# Patient Record
Sex: Male | Born: 1935 | Race: White | Hispanic: No | Marital: Married | Smoking: Former smoker
Health system: Southern US, Community
[De-identification: ages and names within clinical notes are randomized; demographics above are authoritative.]

## PROBLEM LIST (undated history)

## (undated) DIAGNOSIS — G459 Transient cerebral ischemic attack, unspecified: Secondary | ICD-10-CM

## (undated) DIAGNOSIS — N183 Chronic kidney disease, stage 3 unspecified: Secondary | ICD-10-CM

## (undated) DIAGNOSIS — R42 Dizziness and giddiness: Secondary | ICD-10-CM

## (undated) DIAGNOSIS — I10 Essential (primary) hypertension: Secondary | ICD-10-CM

## (undated) DIAGNOSIS — D649 Anemia, unspecified: Secondary | ICD-10-CM

## (undated) DIAGNOSIS — K766 Portal hypertension: Secondary | ICD-10-CM

## (undated) DIAGNOSIS — R161 Splenomegaly, not elsewhere classified: Secondary | ICD-10-CM

## (undated) DIAGNOSIS — K5520 Angiodysplasia of colon without hemorrhage: Secondary | ICD-10-CM

## (undated) DIAGNOSIS — G629 Polyneuropathy, unspecified: Secondary | ICD-10-CM

## (undated) DIAGNOSIS — D509 Iron deficiency anemia, unspecified: Secondary | ICD-10-CM

## (undated) DIAGNOSIS — E669 Obesity, unspecified: Secondary | ICD-10-CM

## (undated) DIAGNOSIS — I8500 Esophageal varices without bleeding: Secondary | ICD-10-CM

## (undated) DIAGNOSIS — B9681 Helicobacter pylori [H. pylori] as the cause of diseases classified elsewhere: Secondary | ICD-10-CM

## (undated) DIAGNOSIS — D696 Thrombocytopenia, unspecified: Secondary | ICD-10-CM

## (undated) DIAGNOSIS — D126 Benign neoplasm of colon, unspecified: Secondary | ICD-10-CM

## (undated) DIAGNOSIS — K2970 Gastritis, unspecified, without bleeding: Secondary | ICD-10-CM

## (undated) DIAGNOSIS — E119 Type 2 diabetes mellitus without complications: Secondary | ICD-10-CM

## (undated) DIAGNOSIS — K922 Gastrointestinal hemorrhage, unspecified: Secondary | ICD-10-CM

## (undated) DIAGNOSIS — E785 Hyperlipidemia, unspecified: Secondary | ICD-10-CM

## (undated) HISTORY — DX: Angiodysplasia of colon without hemorrhage: K55.20

## (undated) HISTORY — DX: Gastrointestinal hemorrhage, unspecified: K92.2

## (undated) HISTORY — DX: Obesity, unspecified: E66.9

## (undated) HISTORY — DX: Splenomegaly, not elsewhere classified: R16.1

## (undated) HISTORY — DX: Chronic kidney disease, stage 3 (moderate): N18.3

## (undated) HISTORY — DX: Iron deficiency anemia, unspecified: D50.9

## (undated) HISTORY — DX: Helicobacter pylori (H. pylori) as the cause of diseases classified elsewhere: B96.81

## (undated) HISTORY — DX: Dizziness and giddiness: R42

## (undated) HISTORY — DX: Esophageal varices without bleeding: I85.00

## (undated) HISTORY — DX: Transient cerebral ischemic attack, unspecified: G45.9

## (undated) HISTORY — DX: Type 2 diabetes mellitus without complications: E11.9

## (undated) HISTORY — DX: Gastritis, unspecified, without bleeding: K29.70

## (undated) HISTORY — DX: Anemia, unspecified: D64.9

## (undated) HISTORY — DX: Thrombocytopenia, unspecified: D69.6

## (undated) HISTORY — DX: Portal hypertension: K76.6

## (undated) HISTORY — DX: Benign neoplasm of colon, unspecified: D12.6

## (undated) HISTORY — DX: Essential (primary) hypertension: I10

## (undated) HISTORY — DX: Chronic kidney disease, stage 3 unspecified: N18.30

## (undated) HISTORY — PX: TONSILLECTOMY AND ADENOIDECTOMY: SUR1326

## (undated) HISTORY — DX: Polyneuropathy, unspecified: G62.9

## (undated) HISTORY — DX: Hyperlipidemia, unspecified: E78.5

---

## 2006-08-27 ENCOUNTER — Ambulatory Visit (HOSPITAL_COMMUNITY): Admission: RE | Admit: 2006-08-27 | Discharge: 2006-08-27 | Payer: Self-pay | Admitting: Family Medicine

## 2008-09-19 ENCOUNTER — Ambulatory Visit: Payer: Self-pay | Admitting: Hematology and Oncology

## 2008-09-21 LAB — CBC & DIFF AND RETIC
BASO%: 0.5 % (ref 0.0–2.0)
HCT: 33.1 % — ABNORMAL LOW (ref 38.4–49.9)
LYMPH%: 15.7 % (ref 14.0–49.0)
MCHC: 32.4 g/dL (ref 32.0–36.0)
MCV: 76.8 fL — ABNORMAL LOW (ref 79.3–98.0)
MONO#: 0.5 10*3/uL (ref 0.1–0.9)
MONO%: 7.3 % (ref 0.0–14.0)
NEUT%: 74.7 % (ref 39.0–75.0)
Platelets: 118 10*3/uL — ABNORMAL LOW (ref 140–400)
RBC: 4.31 10*6/uL (ref 4.20–5.82)
Retic %: 1.9 % (ref 0.7–2.3)
WBC: 6.6 10*3/uL (ref 4.0–10.3)

## 2008-09-21 LAB — MORPHOLOGY

## 2008-09-23 LAB — PROTEIN ELECTROPHORESIS, SERUM, WITH REFLEX
Albumin ELP: 56.2 % (ref 55.8–66.1)
Alpha-1-Globulin: 4.1 % (ref 2.9–4.9)
Beta Globulin: 6.5 % (ref 4.7–7.2)
Total Protein, Serum Electrophoresis: 7.3 g/dL (ref 6.0–8.3)

## 2008-09-23 LAB — DIRECT ANTIGLOBULIN TEST (NOT AT ARMC)
DAT (Complement): NEGATIVE
DAT IgG: NEGATIVE

## 2008-09-30 ENCOUNTER — Ambulatory Visit (HOSPITAL_COMMUNITY): Admission: RE | Admit: 2008-09-30 | Discharge: 2008-09-30 | Payer: Self-pay | Admitting: Hematology and Oncology

## 2008-10-07 ENCOUNTER — Ambulatory Visit (HOSPITAL_COMMUNITY): Admission: RE | Admit: 2008-10-07 | Discharge: 2008-10-07 | Payer: Self-pay | Admitting: General Surgery

## 2008-10-07 ENCOUNTER — Encounter (INDEPENDENT_AMBULATORY_CARE_PROVIDER_SITE_OTHER): Payer: Self-pay | Admitting: General Surgery

## 2008-10-26 ENCOUNTER — Ambulatory Visit: Payer: Self-pay | Admitting: Hematology and Oncology

## 2008-11-18 ENCOUNTER — Ambulatory Visit (HOSPITAL_COMMUNITY): Admission: RE | Admit: 2008-11-18 | Discharge: 2008-11-18 | Payer: Self-pay | Admitting: Hematology and Oncology

## 2008-11-18 LAB — CBC WITH DIFFERENTIAL/PLATELET
BASO%: 0.3 % (ref 0.0–2.0)
Basophils Absolute: 0 10*3/uL (ref 0.0–0.1)
EOS%: 1.8 % (ref 0.0–7.0)
HCT: 37.7 % — ABNORMAL LOW (ref 38.4–49.9)
HGB: 12.3 g/dL — ABNORMAL LOW (ref 13.0–17.1)
LYMPH%: 17 % (ref 14.0–49.0)
MCH: 26.9 pg — ABNORMAL LOW (ref 27.2–33.4)
MCHC: 32.7 g/dL (ref 32.0–36.0)
MCV: 82.5 fL (ref 79.3–98.0)
MONO%: 6.6 % (ref 0.0–14.0)
NEUT%: 74.3 % (ref 39.0–75.0)
lymph#: 1.1 10*3/uL (ref 0.9–3.3)

## 2008-11-18 LAB — BASIC METABOLIC PANEL
BUN: 13 mg/dL (ref 6–23)
Calcium: 9.1 mg/dL (ref 8.4–10.5)
Chloride: 105 mEq/L (ref 96–112)
Creatinine, Ser: 0.74 mg/dL (ref 0.40–1.50)

## 2008-11-25 ENCOUNTER — Ambulatory Visit: Payer: Self-pay | Admitting: Hematology and Oncology

## 2009-12-18 ENCOUNTER — Ambulatory Visit: Payer: Self-pay | Admitting: Hematology & Oncology

## 2009-12-29 LAB — CBC WITH DIFFERENTIAL (CANCER CENTER ONLY)
BASO#: 0 10*3/uL (ref 0.0–0.2)
Eosinophils Absolute: 0.2 10*3/uL (ref 0.0–0.5)
HGB: 14.5 g/dL (ref 13.0–17.1)
LYMPH#: 0.9 10*3/uL (ref 0.9–3.3)
MCH: 32.8 pg (ref 28.0–33.4)
MONO#: 0.3 10*3/uL (ref 0.1–0.9)
MONO%: 6.1 % (ref 0.0–13.0)
NEUT#: 2.9 10*3/uL (ref 1.5–6.5)
RBC: 4.42 10*6/uL (ref 4.20–5.70)
WBC: 4.2 10*3/uL (ref 4.0–10.0)

## 2009-12-29 LAB — RETICULOCYTES (CHCC)
ABS Retic: 57.7 10*3/uL (ref 19.0–186.0)
RBC.: 4.44 MIL/uL (ref 4.22–5.81)
Retic Ct Pct: 1.3 % (ref 0.4–3.1)

## 2009-12-29 LAB — FERRITIN: Ferritin: 96 ng/mL (ref 22–322)

## 2009-12-29 LAB — IRON AND TIBC: Iron: 129 ug/dL (ref 42–165)

## 2010-02-15 ENCOUNTER — Ambulatory Visit: Payer: Self-pay | Admitting: Hematology & Oncology

## 2010-02-16 LAB — CBC WITH DIFFERENTIAL (CANCER CENTER ONLY)
BASO%: 0.5 % (ref 0.0–2.0)
HCT: 36.1 % — ABNORMAL LOW (ref 38.7–49.9)
HGB: 12.3 g/dL — ABNORMAL LOW (ref 13.0–17.1)
LYMPH#: 0.9 10*3/uL (ref 0.9–3.3)
MONO#: 0.4 10*3/uL (ref 0.1–0.9)
NEUT%: 69 % (ref 40.0–80.0)
RDW: 11.9 % (ref 10.5–14.6)
WBC: 4.9 10*3/uL (ref 4.0–10.0)

## 2010-04-05 ENCOUNTER — Ambulatory Visit: Payer: Self-pay | Admitting: Hematology & Oncology

## 2010-04-06 LAB — CBC WITH DIFFERENTIAL (CANCER CENTER ONLY)
BASO#: 0 10*3/uL (ref 0.0–0.2)
BASO%: 0.3 % (ref 0.0–2.0)
EOS%: 4.2 % (ref 0.0–7.0)
HCT: 38.8 % (ref 38.7–49.9)
HGB: 13.1 g/dL (ref 13.0–17.1)
LYMPH%: 14.9 % (ref 14.0–48.0)
MCHC: 33.7 g/dL (ref 32.0–35.9)
MCV: 100 fL — ABNORMAL HIGH (ref 82–98)
NEUT%: 72.8 % (ref 40.0–80.0)
RDW: 11.5 % (ref 10.5–14.6)

## 2010-08-31 ENCOUNTER — Other Ambulatory Visit: Payer: Self-pay | Admitting: Hematology & Oncology

## 2010-08-31 ENCOUNTER — Encounter (HOSPITAL_BASED_OUTPATIENT_CLINIC_OR_DEPARTMENT_OTHER): Payer: Medicare Other | Admitting: Hematology & Oncology

## 2010-08-31 DIAGNOSIS — D696 Thrombocytopenia, unspecified: Secondary | ICD-10-CM

## 2010-08-31 LAB — CBC WITH DIFFERENTIAL (CANCER CENTER ONLY)
BASO#: 0 10*3/uL (ref 0.0–0.2)
BASO%: 0.3 % (ref 0.0–2.0)
HCT: 35.4 % — ABNORMAL LOW (ref 38.7–49.9)
HGB: 12.5 g/dL — ABNORMAL LOW (ref 13.0–17.1)
LYMPH%: 20.5 % (ref 14.0–48.0)
MCHC: 35.3 g/dL (ref 32.0–35.9)
MCV: 96 fL (ref 82–98)
MONO#: 0.6 10*3/uL (ref 0.1–0.9)
NEUT%: 69 % (ref 40.0–80.0)
RDW: 12.7 % (ref 11.1–15.7)
WBC: 6.9 10*3/uL (ref 4.0–10.0)

## 2010-09-11 NOTE — H&P (Signed)
NAME:  Gary Ingram, Gary Ingram NO.:  [id number]   MEDICAL RECORD NO.:  [id number]          PATIENT TYPE:  AMB   LOCATION:  DAY                           FACILITY:  [facility]   PHYSICIAN:  Jamesetta So, M.D.  DATE OF BIRTH:  [birth year]   DATE OF ADMISSION:  DATE OF DISCHARGE:  LH                              HISTORY & PHYSICAL   CHIEF COMPLAINT:  Anemia, iron deficiency.   HISTORY OF PRESENT ILLNESS:  The patient is a 77 year old white male,  who is referred for endoscopic evaluation.  Needs colonoscopy due to  iron-deficiency anemia.  No abdominal pain, weight loss, nausea,  vomiting, diarrhea, constipation, melena, or hematochezia have been  noted.  He has never had a colonoscopy.  There is no family history of  colon carcinoma.   PAST MEDICAL HISTORY:  1. Type 2 diabetes.  2. Hypertension.  3. Vertigo.  4. Questionable history of TIAs.   CURRENT MEDICATIONS:  Ecotrin, meclizine, and metoprolol.   ALLERGIES:  No known drug allergies.   REVIEW OF SYSTEMS:  Noncontributory.   PHYSICAL EXAMINATION:  GENERAL:  The patient is a well-developed, well-  nourished white male in no acute distress.  LUNGS:  Clear to auscultation with equal breath sounds bilaterally.  HEART:  Regular rate and rhythm without S3, S4, or murmurs.  ABDOMEN:  Soft, nontender, and nondistended.  No hepatosplenomegaly or  masses are noted.  RECTAL:  Deferred due to the procedure.   IMPRESSION:  Anemia, iron deficiency.   PLAN:  The patient is scheduled for colonoscopy on October 07, 2008.  Risks  and benefits of the procedure including bleeding and perforation were  fully explained to the patient, gave informed consent.      Jamesetta So, M.D.  Electronically Signed     MAJ/MEDQ  D:  09/27/2008  T:  09/28/2008  Job:  [id number]   cc:   Leonides Grills, M.D.  Fax: [phone]

## 2011-02-01 ENCOUNTER — Encounter (HOSPITAL_BASED_OUTPATIENT_CLINIC_OR_DEPARTMENT_OTHER): Payer: Medicare Other | Admitting: Hematology & Oncology

## 2011-02-01 ENCOUNTER — Other Ambulatory Visit: Payer: Self-pay | Admitting: Hematology & Oncology

## 2011-02-01 DIAGNOSIS — D649 Anemia, unspecified: Secondary | ICD-10-CM

## 2011-02-01 DIAGNOSIS — D696 Thrombocytopenia, unspecified: Secondary | ICD-10-CM

## 2011-02-01 LAB — CBC WITH DIFFERENTIAL (CANCER CENTER ONLY)
BASO#: 0 10*3/uL (ref 0.0–0.2)
Eosinophils Absolute: 0.1 10*3/uL (ref 0.0–0.5)
HGB: 11.8 g/dL — ABNORMAL LOW (ref 13.0–17.1)
MONO#: 0.4 10*3/uL (ref 0.1–0.9)
NEUT#: 3.1 10*3/uL (ref 1.5–6.5)
Platelets: 60 10*3/uL — ABNORMAL LOW (ref 145–400)
RBC: 3.41 10*6/uL — ABNORMAL LOW (ref 4.20–5.70)
WBC: 4.6 10*3/uL (ref 4.0–10.0)

## 2011-02-01 LAB — CHCC SATELLITE - SMEAR

## 2011-05-16 ENCOUNTER — Telehealth: Payer: Self-pay | Admitting: Hematology & Oncology

## 2011-05-16 NOTE — Telephone Encounter (Signed)
Pt cx 05/17/11 apt due to weather.  He resch for 05/31/11

## 2011-05-17 ENCOUNTER — Other Ambulatory Visit: Payer: Medicare Other | Admitting: Lab

## 2011-05-17 ENCOUNTER — Ambulatory Visit: Payer: Medicare Other | Admitting: Hematology & Oncology

## 2011-05-31 ENCOUNTER — Ambulatory Visit (HOSPITAL_BASED_OUTPATIENT_CLINIC_OR_DEPARTMENT_OTHER): Payer: Medicare Other | Admitting: Hematology & Oncology

## 2011-05-31 ENCOUNTER — Other Ambulatory Visit (HOSPITAL_BASED_OUTPATIENT_CLINIC_OR_DEPARTMENT_OTHER): Payer: Medicare Other | Admitting: Lab

## 2011-05-31 VITALS — BP 129/59 | HR 54 | Temp 96.9°F | Ht 68.0 in | Wt 240.0 lb

## 2011-05-31 DIAGNOSIS — D696 Thrombocytopenia, unspecified: Secondary | ICD-10-CM

## 2011-05-31 LAB — CBC WITH DIFFERENTIAL (CANCER CENTER ONLY)
BASO#: 0 10*3/uL (ref 0.0–0.2)
BASO%: 0.4 % (ref 0.0–2.0)
Eosinophils Absolute: 0.2 10*3/uL (ref 0.0–0.5)
HCT: 34.3 % — ABNORMAL LOW (ref 38.7–49.9)
HGB: 11.7 g/dL — ABNORMAL LOW (ref 13.0–17.1)
LYMPH#: 0.8 10*3/uL — ABNORMAL LOW (ref 0.9–3.3)
MONO#: 0.4 10*3/uL (ref 0.1–0.9)
NEUT%: 68.8 % (ref 40.0–80.0)
RBC: 3.46 10*6/uL — ABNORMAL LOW (ref 4.20–5.70)
WBC: 4.6 10*3/uL (ref 4.0–10.0)

## 2011-05-31 NOTE — Progress Notes (Signed)
This office note has been dictated.

## 2011-05-31 NOTE — Progress Notes (Signed)
CC:   Gary Ingram, M.D.  DIAGNOSIS:  Thrombocytopenia.  CURRENT THERAPY:  Observation.  INTERIM HISTORY:  Mr. Diagne comes in for his followup.  I saw him back in October.  He did well over the holidays.  He does not do all that much.  He is eating okay.  He has had no problem with bleeding or bruising.  He has had no problems with the flu or other infections.  PHYSICAL EXAMINATION:  General:  This is an elderly white gentleman in no obvious distress.  Vital signs:  Show a temperature of 96.9, pulse 54, respiratory rate 18, blood pressure 129/59.  Weight is 240.  Head and neck:  Exam shows a normocephalic, atraumatic skull.  There are no ocular or oral lesions.  There are no palpable cervical or supraclavicular lymph nodes.  Lungs:  Are clear bilaterally.  Cardiac: Regular rate and rhythm with normal S1 and S2.  He has a 1/6 systolic ejection murmur.  Abdomen:  Soft with good bowel sounds.  There is no palpable abdominal mass.  There is no palpable hepatosplenomegaly. Back:  No tenderness over the spine, ribs or hips.  Extremities:  Shows no clubbing, cyanosis or edema.  Neurological:  Exam shows no focal neurological deficits.  Skin:  Exam shows no rashes, ecchymoses or petechiae.  LABORATORY STUDIES:  White cell count is 4.6, hemoglobin 11.7, hematocrit 34.3, platelet count is 51.  MCV is 99.  IMPRESSION:  Mr. Wee is a 78 year old white gentleman with progressive thrombocytopenia.  We have been following him now for a year or so.  His platelet count has been trending down a little bit. For now, I really think that we can just be conservative.  He is still asymptomatic.  Will plan to get him back to see [place] in another 2 months.  If his blood count continues to fall then we are going to be forced to do a bone marrow biopsy on him.  I will see Mr. Riche back in 2 months.    ______________________________ Volanda Napoleon, M.D. PRE/MEDQ  D:  05/31/2011  T:  05/31/2011   Job:  [id number]

## 2011-06-25 IMAGING — CT CT ABDOMEN W/ CM
2 of 5 series · 16 of 46 positions shown, 18 images · IV contrast (omniscan)
Comparison: Abdominal ultrasound 09/30/2008. No similar prior study
is available for comparison.

CLINICAL DATA: Enlarged spleen, thrombocytopenia

CT ABDOMEN WITH CONTRAST
TECHNIQUE: Multidetector CT imaging of the abdomen was performed
using the standard protocol following bolus administration of
intravenous contrast.
Contrast: 100 ml Omniscan 300 IV contrast

[Series 2: rtn a/p with · axial · 0.89mm/px · z∈[-243,-43]mm · 13 of 48 slices shown, 15 images]
[im 4/48  soft-tissue]
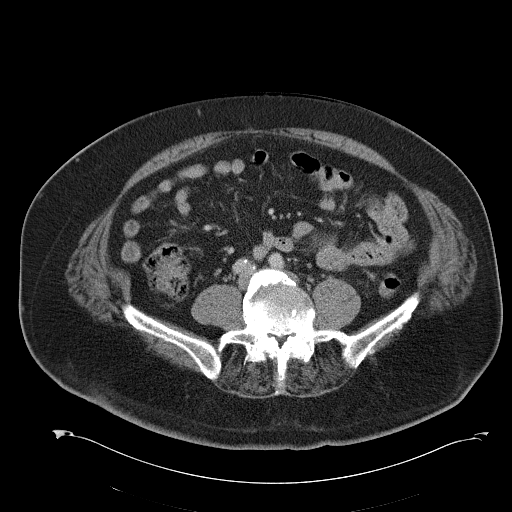
[im 4/48  bone]
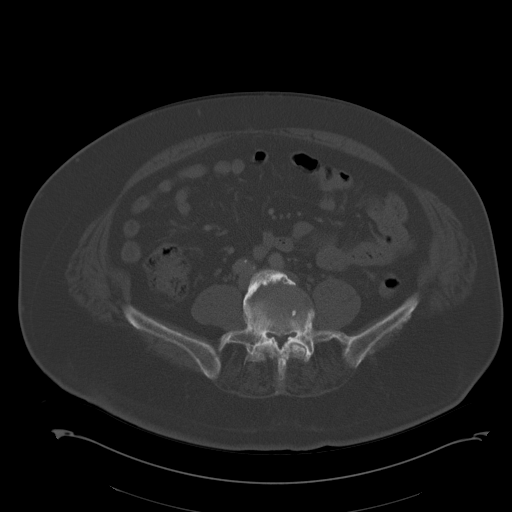
[im 7/48  soft-tissue]
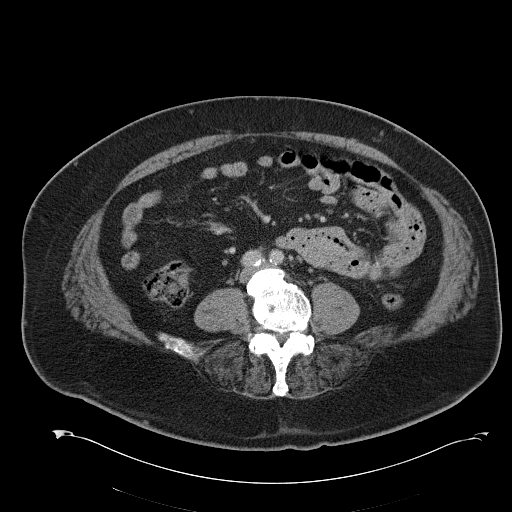
[im 11/48  soft-tissue]
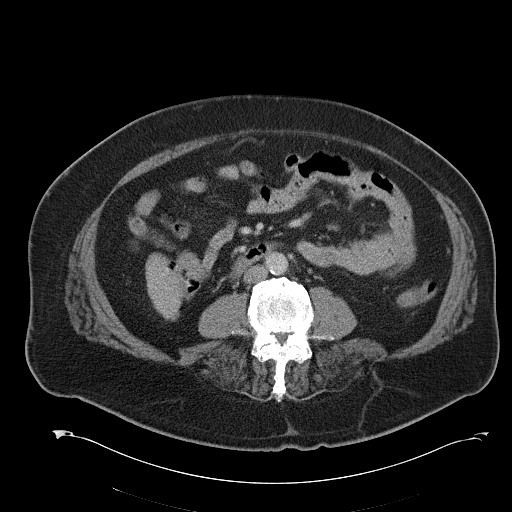
[im 14/48  soft-tissue]
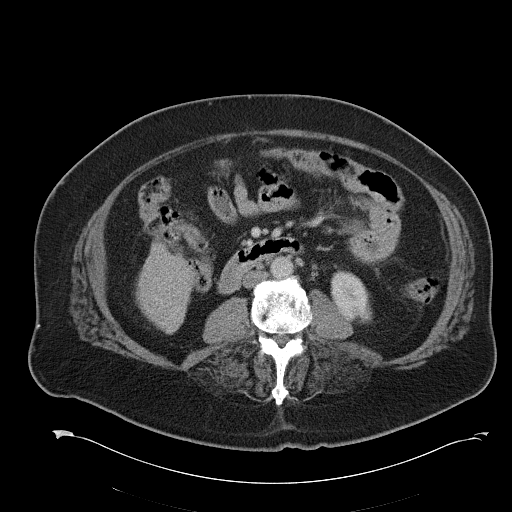
[im 17/48  soft-tissue]
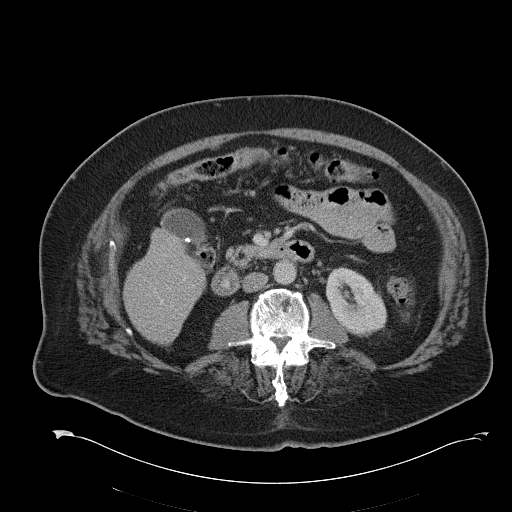
[im 21/48  soft-tissue]
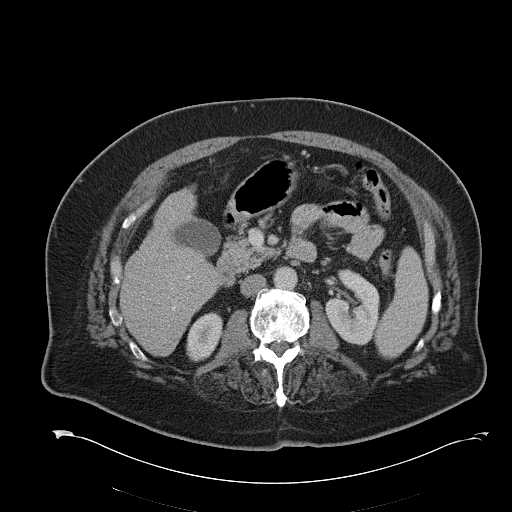
[im 24/48  soft-tissue]
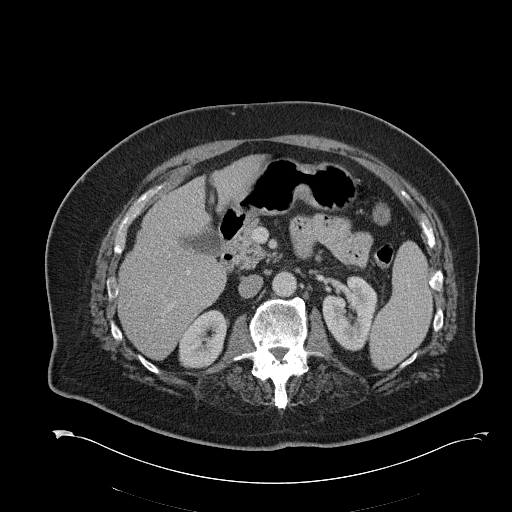
[im 27/48  soft-tissue]
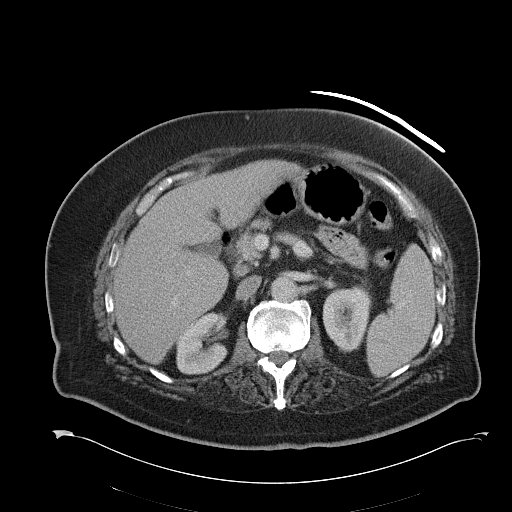
[im 31/48  soft-tissue]
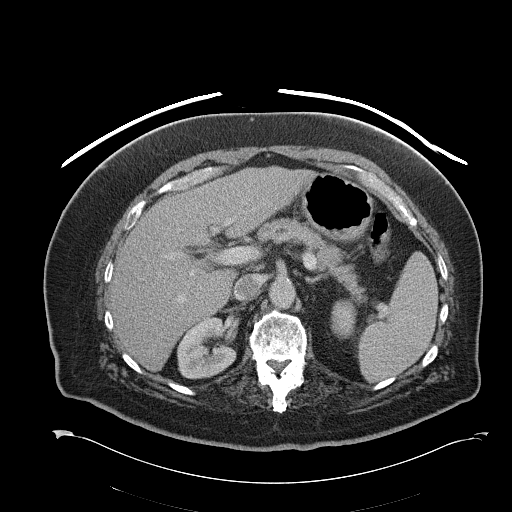
[im 31/48  bone]
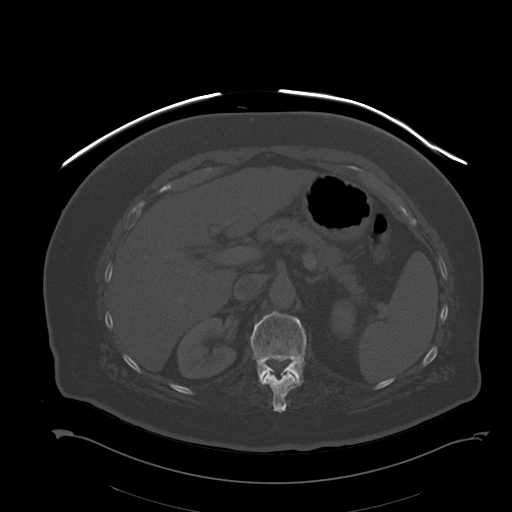
[im 34/48  soft-tissue]
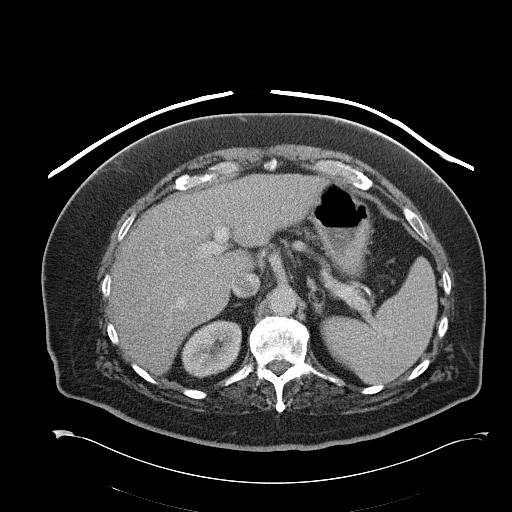
[im 37/48  soft-tissue]
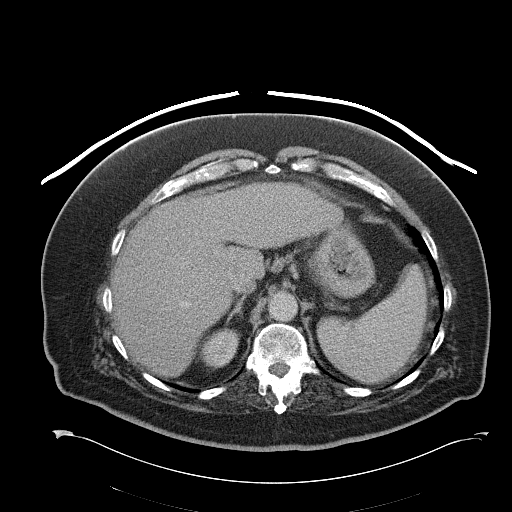
[im 41/48  soft-tissue]
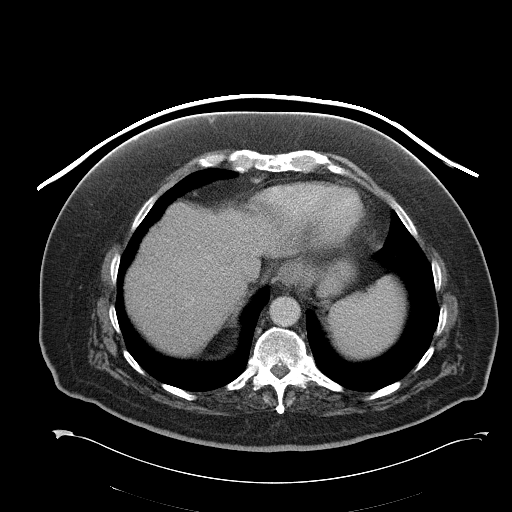
[im 44/48  soft-tissue]
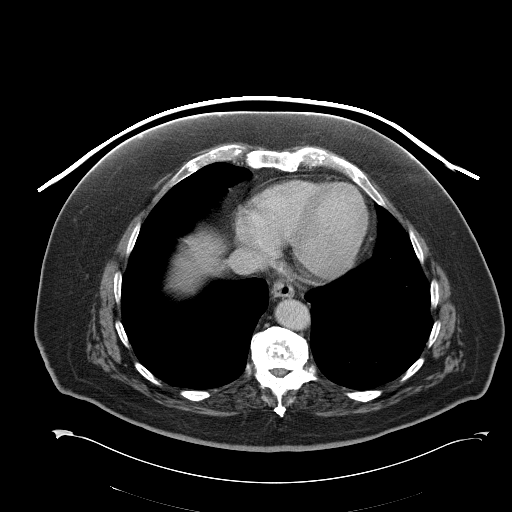

[Series 602: <mpr thick range> · coronal · 0.89mm/px · 3 of 84 slices shown]
[im 28/84  soft-tissue]
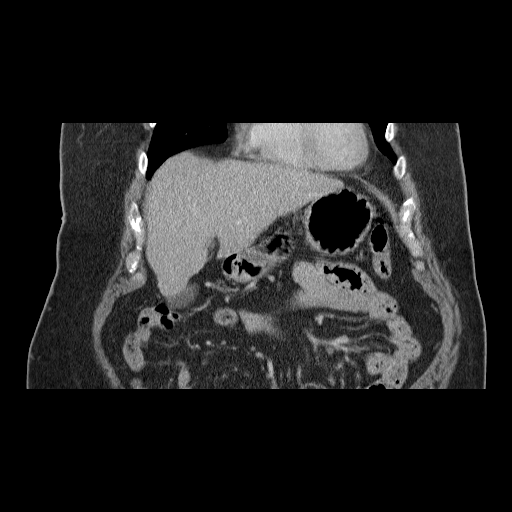
[im 37/84  soft-tissue]
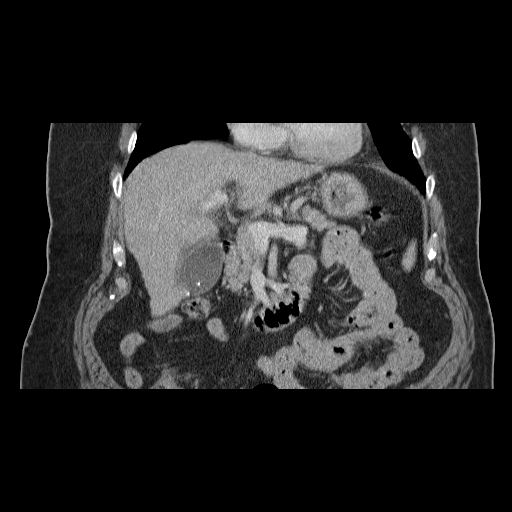
[im 47/84  soft-tissue]
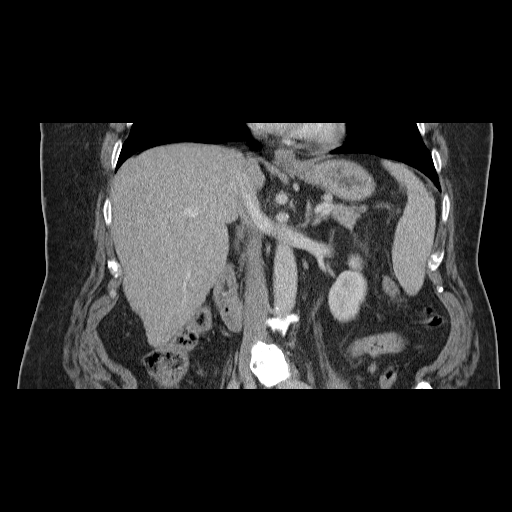

[16 of 46 positions shown; findings below may reference images not displayed]

FINDINGS: The spleen measures 11.6 x 5.8 by 12.6 cm (volume 445
cc). A gallstone is incidentally noted without other CT evidence
for cholecystitis.  Liver, kidneys, the right adrenal gland, and
pancreas are unremarkable.  A 1.4 cm left adrenal mass containing
gross fat is compatible with a myelolipoma.  Scattered
atherosclerotic aortic calcification noted. Disc degenerative
changes are noted.
IMPRESSION: Splenic volume is mildly enlarged.

Left adrenal myelolipoma incidentally noted.

No acute finding.

## 2011-08-02 ENCOUNTER — Ambulatory Visit (HOSPITAL_BASED_OUTPATIENT_CLINIC_OR_DEPARTMENT_OTHER): Payer: Medicare Other | Admitting: Hematology & Oncology

## 2011-08-02 ENCOUNTER — Other Ambulatory Visit (HOSPITAL_BASED_OUTPATIENT_CLINIC_OR_DEPARTMENT_OTHER): Payer: Medicare Other | Admitting: Lab

## 2011-08-02 VITALS — BP 132/71 | HR 62 | Temp 97.1°F | Ht 68.0 in | Wt 241.0 lb

## 2011-08-02 DIAGNOSIS — D696 Thrombocytopenia, unspecified: Secondary | ICD-10-CM

## 2011-08-02 DIAGNOSIS — D649 Anemia, unspecified: Secondary | ICD-10-CM

## 2011-08-02 LAB — CBC WITH DIFFERENTIAL (CANCER CENTER ONLY)
BASO%: 0.9 % (ref 0.0–2.0)
EOS%: 2.4 % (ref 0.0–7.0)
HCT: 33.6 % — ABNORMAL LOW (ref 38.7–49.9)
LYMPH#: 1 10*3/uL (ref 0.9–3.3)
MCHC: 34.5 g/dL (ref 32.0–35.9)
MONO#: 0.5 10*3/uL (ref 0.1–0.9)
NEUT%: 70.3 % (ref 40.0–80.0)
Platelets: 61 10*3/uL — ABNORMAL LOW (ref 145–400)
RDW: 12.8 % (ref 11.1–15.7)
WBC: 5.8 10*3/uL (ref 4.0–10.0)

## 2011-08-02 LAB — CHCC SATELLITE - SMEAR

## 2011-08-02 NOTE — Progress Notes (Signed)
This office note has been dictated.

## 2011-08-03 NOTE — Progress Notes (Signed)
CC:   Janalyn Rouse, M.D.  DIAGNOSIS:  Chronic thrombocytopenia, asymptomatic.  CURRENT THERAPY:  Observation.  INTERIM HISTORY:  Mr. Kustra comes in for followup.  We last saw him in February.  At that point in time, his platelet count had gone down to 50.  I just want make sure we follow him closely.  Since then, he has had no problems.  There has been no change in medications.  He is on quite a few medicines.  He has had no bleeding present.  He has had no bruising.  He has had no change in bowel or bladder habits.  There has been no cough.  There have been no rashes.  PHYSICAL EXAMINATION:  This is an elderly white gentleman in no obvious distress.  Vital signs: 97.1, pulse 62, respiratory rate 18, blood pressure 132/71.  Weight is 241 pounds.  Head and neck exam shows a normocephalic, atraumatic skull.  There are no ocular or oral lesions. There are no palpable cervical or supraclavicular lymph nodes.  Lungs: Clear to percussion and auscultation bilaterally.  Cardiac: Regular rhythm with a normal S1, S2.  There are no murmurs, rubs, or bruits. Abdomen:  Soft with good bowel sounds.  He is slightly obese.  He has had no fluid wave.  There is no abdominal mass.  There is no palpable hepatosplenomegaly.  Back: No tenderness over the spine, ribs, or hips. Extremities:  No  clubbing, cyanosis or edema.  Neurologic:  No focal neurological deficits.  Skin:  No rashes, ecchymoses or petechia.  He may have a couple squamous cell or basal cell carcinomas on his skin, but these are small.  LABORATORY STUDIES:  White cell count 5.8, hemoglobin 11.6, hematocrit 33.6 and platelet count 61,000.  MCV is 99.  IMPRESSION:  Mr. Feicht is a 78 year old gentleman.  Again, he and I have the same birthday.  His platelet count is relatively stable.  He has mild anemia.  He is asymptomatic.  I still feel that we can follow him supportively.  I do not see a need for a bone marrow test on  him.  We will plan to get him back now in I think 4 months.  I think we can get him through the holidays and through the summer time.   ______________________________ Volanda Napoleon, M.D. PRE/MEDQ  D:  08/02/2011  T:  08/03/2011  Job:  [id number]

## 2011-12-13 ENCOUNTER — Ambulatory Visit: Payer: Medicare Other | Admitting: Medical

## 2011-12-13 ENCOUNTER — Other Ambulatory Visit: Payer: Medicare Other | Admitting: Lab

## 2011-12-16 ENCOUNTER — Telehealth: Payer: Self-pay | Admitting: Hematology & Oncology

## 2011-12-16 NOTE — Telephone Encounter (Signed)
Pt rescheduled missed 8-16 to 9-13

## 2012-01-10 ENCOUNTER — Other Ambulatory Visit (HOSPITAL_BASED_OUTPATIENT_CLINIC_OR_DEPARTMENT_OTHER): Payer: Medicare Other | Admitting: Lab

## 2012-01-10 ENCOUNTER — Ambulatory Visit (HOSPITAL_BASED_OUTPATIENT_CLINIC_OR_DEPARTMENT_OTHER): Payer: Medicare Other | Admitting: Hematology & Oncology

## 2012-01-10 VITALS — BP 124/56 | HR 61 | Temp 97.4°F | Resp 22 | Ht 68.0 in | Wt 225.0 lb

## 2012-01-10 DIAGNOSIS — D649 Anemia, unspecified: Secondary | ICD-10-CM

## 2012-01-10 DIAGNOSIS — D696 Thrombocytopenia, unspecified: Secondary | ICD-10-CM

## 2012-01-10 LAB — CBC WITH DIFFERENTIAL (CANCER CENTER ONLY)
EOS%: 3.4 % (ref 0.0–7.0)
Eosinophils Absolute: 0.2 10*3/uL (ref 0.0–0.5)
LYMPH#: 1.1 10*3/uL (ref 0.9–3.3)
MCH: 33 pg (ref 28.0–33.4)
MCHC: 32.6 g/dL (ref 32.0–35.9)
MONO%: 8.6 % (ref 0.0–13.0)
NEUT#: 3.9 10*3/uL (ref 1.5–6.5)
Platelets: 70 10*3/uL — ABNORMAL LOW (ref 145–400)
RBC: 3.09 10*6/uL — ABNORMAL LOW (ref 4.20–5.70)

## 2012-01-10 LAB — CHCC SATELLITE - SMEAR

## 2012-01-10 NOTE — Progress Notes (Signed)
This office note has been dictated.

## 2012-01-10 NOTE — Progress Notes (Signed)
CC:   Janalyn Rouse, M.D.  DIAGNOSIS:  Chronic thrombocytopenia.  CURRENT THERAPY:  Observation.  INTERIM HISTORY:  Mr. Gary Ingram comes in for followup.  We see him every 6 months.  He has been doing well.  He had a good summer.  He had no problems over the summer.  He says there is no change in his medications.  He has had no bleeding or bruising.  There has been no change in bowel or bladder habits.  He has not noticed any leg swelling.  There have been no rashes.  PHYSICAL EXAMINATION:  General:  This is an elderly white gentleman in no obvious distress.  Vital signs:  Temperature of 97.4, pulse 61, respiratory rate 22, blood pressure 124/56.  Weight is 225.  Head and neck:  Normocephalic, atraumatic skull.  He has no ocular or oral lesions.  There are no palpable cervical or supraclavicular lymph nodes. Lungs:  Clear bilaterally.  Cardiac:  Regular rate and rhythm with a normal S1, S2.  He has an occasional extra beat.  He has no murmurs, rubs or bruits.  Abdomen:  Soft with good bowel sounds.  There is no palpable abdominal mass.  There is no fluid wave.  There is no palpable hepatosplenomegaly.  Extremities:  Shows no clubbing, cyanosis or edema. Skin:  No rashes, ecchymoses or petechiae.  Neurological:  Shows no focal neurological deficits.  LABORATORY STUDIES:  White cell count is 5.7, hemoglobin 10.2, hematocrit 31.3, platelet count 70.  MCV is 101.  IMPRESSION:  Mr. Gary Ingram is a 78 year old gentleman with thrombocytopenia.  This is stable to improving a little bit.  He is a little bit more anemic.  I am not sure as to what is going on with the anemia.  I looked at his blood under the microscope.  I did not see anything that was unusual with his blood count.  I did not see anything that showed nucleated red blood cells.  I saw no schistocytes.  There was no rouleaux formation.  I still think we can probably get him back in 6  months.    ______________________________ Volanda Napoleon, M.D. PRE/MEDQ  D:  01/10/2012  T:  01/10/2012  Job:  [id number]

## 2012-06-17 ENCOUNTER — Telehealth: Payer: Self-pay | Admitting: Hematology & Oncology

## 2012-06-17 NOTE — Telephone Encounter (Signed)
Pt aware of 4-11 appointment

## 2012-07-01 ENCOUNTER — Ambulatory Visit: Payer: Medicare Other | Admitting: Hematology & Oncology

## 2012-07-01 ENCOUNTER — Other Ambulatory Visit: Payer: Medicare Other | Admitting: Lab

## 2012-07-03 ENCOUNTER — Ambulatory Visit: Payer: Medicare Other | Admitting: Hematology & Oncology

## 2012-07-03 ENCOUNTER — Other Ambulatory Visit: Payer: Medicare Other | Admitting: Lab

## 2012-08-07 ENCOUNTER — Ambulatory Visit (HOSPITAL_BASED_OUTPATIENT_CLINIC_OR_DEPARTMENT_OTHER): Payer: Medicare Other | Admitting: Hematology & Oncology

## 2012-08-07 ENCOUNTER — Other Ambulatory Visit (HOSPITAL_BASED_OUTPATIENT_CLINIC_OR_DEPARTMENT_OTHER): Payer: Medicare Other | Admitting: Lab

## 2012-08-07 VITALS — BP 141/55 | HR 57 | Temp 97.3°F | Resp 16 | Ht 68.0 in | Wt 228.0 lb

## 2012-08-07 DIAGNOSIS — D696 Thrombocytopenia, unspecified: Secondary | ICD-10-CM

## 2012-08-07 DIAGNOSIS — D649 Anemia, unspecified: Secondary | ICD-10-CM

## 2012-08-07 LAB — CBC WITH DIFFERENTIAL (CANCER CENTER ONLY)
BASO#: 0 10*3/uL (ref 0.0–0.2)
Eosinophils Absolute: 0.1 10*3/uL (ref 0.0–0.5)
HCT: 32.8 % — ABNORMAL LOW (ref 38.7–49.9)
HGB: 10.8 g/dL — ABNORMAL LOW (ref 13.0–17.1)
LYMPH%: 20 % (ref 14.0–48.0)
MCV: 94 fL (ref 82–98)
MONO#: 0.5 10*3/uL (ref 0.1–0.9)
NEUT%: 67.6 % (ref 40.0–80.0)
RBC: 3.49 10*6/uL — ABNORMAL LOW (ref 4.20–5.70)
WBC: 5.1 10*3/uL (ref 4.0–10.0)

## 2012-08-07 LAB — CHCC SATELLITE - SMEAR

## 2012-08-07 LAB — RETICULOCYTES (CHCC)
RBC.: 3.55 MIL/uL — ABNORMAL LOW (ref 4.22–5.81)
Retic Ct Pct: 1.9 % (ref 0.4–2.3)

## 2012-08-07 LAB — IRON AND TIBC
%SAT: 40 % (ref 20–55)
TIBC: 414 ug/dL (ref 215–435)

## 2012-08-07 NOTE — Progress Notes (Signed)
This office note has been dictated.

## 2012-08-08 NOTE — Progress Notes (Signed)
CC:   Janalyn Rouse, M.D.  DIAGNOSIS:  Chronic thrombocytopenia.  CURRENT THERAPY:  Observation.  INTERIM HISTORY:  Gary Ingram comes in for followup.  We see him every 6 months.  He has had no problems since we last saw him.  He got through the wintertime without any issues.  There has been no bleeding, no bruising.  He has not noticed any kind of rashes.  He has had no change in medications.  He is diabetic and on metformin.  He has had no fevers, sweats, or chills.  He did not get the flu over the wintertime.  PHYSICAL EXAMINATION:  General:  This is an elderly white gentleman in no obvious distress.  Vital signs:  Temperature of 97.3, pulse 57, respiratory rate 16, blood pressure 141/55.  Weight is 228.  Head and neck:  Normocephalic, atraumatic skull.  There are no ocular or oral lesions.  There are no palpable cervical or supraclavicular lymph nodes. Lungs:  Clear bilaterally.  Cardiac:  Regular rate and rhythm, with a normal S1 and S2.  He may have an occasional extra beat.  Abdomen: Soft.  He has good bowel sounds.  There is no palpable abdominal mass. There is no fluid wave.  There is no palpable hepatosplenomegaly.  Back: Shows no tenderness over the spine, ribs, or hips.  Extremities:  Show no clubbing, cyanosis or edema.  Skin:  Shows no rashes, ecchymoses, or petechiae.  LABORATORY STUDIES:  White cell count of 5.1, hemoglobin 10.8, hematocrit 32.8, platelet count 66,000.  MCV is 94.  Peripheral smear shows good maturation of his white blood cells.  There are no hypersegmented polys.  There are no immature myeloid cells. There may be a couple of atypical lymphs.  There are no nucleated red blood cells.  I see no rouleaux formation.  There are no schistocytes. Platelets are decreased in number.  He has a few large platelets.  IMPRESSION:  Gary Ingram is a 79 year old gentleman with thrombocytopenia.  We have been following him now for a couple of years. His  platelet count really has not changed.  I still do not see a need for a bone marrow test.  His blood smear is reassuring.  We will go ahead and get him back in 6 more months.    ______________________________ Gary Ingram, M.D. PRE/MEDQ  D:  08/07/2012  T:  08/08/2012  Job:  [id number]

## 2012-08-12 ENCOUNTER — Other Ambulatory Visit: Payer: Medicare Other | Admitting: Lab

## 2012-08-12 ENCOUNTER — Ambulatory Visit: Payer: Medicare Other | Admitting: Hematology & Oncology

## 2012-09-17 ENCOUNTER — Ambulatory Visit: Payer: Medicare Other | Admitting: Internal Medicine

## 2013-02-05 ENCOUNTER — Other Ambulatory Visit (HOSPITAL_BASED_OUTPATIENT_CLINIC_OR_DEPARTMENT_OTHER): Payer: Medicare Other | Admitting: Lab

## 2013-02-05 ENCOUNTER — Ambulatory Visit (HOSPITAL_BASED_OUTPATIENT_CLINIC_OR_DEPARTMENT_OTHER): Payer: Medicare Other | Admitting: Hematology & Oncology

## 2013-02-05 VITALS — BP 151/51 | HR 64 | Temp 97.3°F | Resp 18 | Ht 68.0 in | Wt 232.0 lb

## 2013-02-05 DIAGNOSIS — D649 Anemia, unspecified: Secondary | ICD-10-CM

## 2013-02-05 DIAGNOSIS — D696 Thrombocytopenia, unspecified: Secondary | ICD-10-CM

## 2013-02-05 LAB — CBC WITH DIFFERENTIAL (CANCER CENTER ONLY)
BASO%: 0.5 % (ref 0.0–2.0)
LYMPH%: 16 % (ref 14.0–48.0)
MCH: 29.6 pg (ref 28.0–33.4)
MCHC: 31.4 g/dL — ABNORMAL LOW (ref 32.0–35.9)
MCV: 94 fL (ref 82–98)
MONO%: 10.3 % (ref 0.0–13.0)
NEUT%: 70 % (ref 40.0–80.0)
Platelets: 63 10*3/uL — ABNORMAL LOW (ref 145–400)
RDW: 13.6 % (ref 11.1–15.7)

## 2013-02-05 LAB — CHCC SATELLITE - SMEAR

## 2013-02-05 NOTE — Progress Notes (Signed)
This office note has been dictated.

## 2013-02-06 NOTE — Progress Notes (Signed)
CC:   Gary Ingram, M.D.  DIAGNOSES: 1. Chronic thrombocytopenia. 2. Progressive anemia.  CURRENT THERAPY:  Observation.  INTERIM HISTORY:  Gary Ingram comes in for followup.  We see him every 6 months.  Since we last saw him, he has had absolutely no problems.  He feels great.  He has had no problems with bleeding or bruising.  There is no fatigue.  He has had a good appetite.  He has not noted any problems with weight loss or weight gain.  He has had no abdominal pain. There has been no change in bowel or bladder habits.  He has had no rashes.  Overall, his performance status is ECOG 2.  PHYSICAL EXAMINATION:  General:  This is a fairly well-developed, well- nourished white gentleman in no obvious distress.  Vital signs: Temperature of 97.3, pulse 64, respiratory rate 18, blood pressure 151/51.  Weight is 232 pounds.  Head and neck:  Normocephalic, atraumatic skull.  There are no ocular or oral lesions.  There are no palpable cervical or supraclavicular lymph nodes.  Lungs:  Clear bilaterally.  Cardiac:  Regular rate and rhythm with a normal S1 and S2. There are no murmurs, rubs or bruits.  Abdomen:  Soft.  He has good bowel sounds.  There is no fluid wave.  There is no palpable abdominal mass.  There is no palpable hepatosplenomegaly.  Extremities:  No clubbing, cyanosis or edema.  Neurologic:  No focal neurological deficits.  Skin:  No rashes, ecchymosis, or petechia.  LABORATORY STUDIES:  White cell count 4.4, hemoglobin 9.3, hematocrit 29.6, platelet count 63,000.  MCV is 94.  I did look at his blood smear.  His blood smear looked relatively unremarkable.  He had normochromic, normocytic red cells.  I do not see any nucleated red blood cells.  There were no target cells or rouleaux formation.  White cells appeared normal in morphology and maturation. There were no hypersegmented polys.  I had no immature myeloid or lymphoid forms.  Platelets were decreased in number.   Platelets were relatively uniform in size.  Platelets were well granulated.  IMPRESSION:  Gary Ingram is a nice 79 year old gentleman.  He has chronic thrombocytopenia.  For now, I think we can follow Gary Ingram supportively.  Again, I have noted that his hemoglobin keeps dropping.  This may be something that we are going to have to look into when we see him back.  He is asymptomatic with this.  We will plan for followup in 6 months.  I think that his if hemoglobin continues to drop, then we are going to have to look into this.    ______________________________ Volanda Napoleon, M.D. PRE/MEDQ  D:  02/05/2013  T:  02/06/2013  Job:  [id number]

## 2013-05-05 ENCOUNTER — Other Ambulatory Visit: Payer: Self-pay | Admitting: *Deleted

## 2013-05-05 NOTE — Progress Notes (Signed)
Received labs from PCP from 04/07/13. To move lab & MD f/u appt up to Jan or Feb. He is becoming more anemic but platelets are improving. Request sent to scheduling.

## 2013-05-06 ENCOUNTER — Telehealth: Payer: Self-pay | Admitting: Hematology & Oncology

## 2013-05-06 NOTE — Telephone Encounter (Signed)
Pt aware appointment moved from April to 2-6

## 2013-06-03 ENCOUNTER — Ambulatory Visit: Payer: Medicare Other | Admitting: Hematology & Oncology

## 2013-06-03 ENCOUNTER — Other Ambulatory Visit: Payer: Medicare Other | Admitting: Lab

## 2013-06-04 ENCOUNTER — Encounter: Payer: Self-pay | Admitting: Hematology & Oncology

## 2013-06-04 ENCOUNTER — Ambulatory Visit (HOSPITAL_BASED_OUTPATIENT_CLINIC_OR_DEPARTMENT_OTHER): Payer: Medicare Other

## 2013-06-04 ENCOUNTER — Other Ambulatory Visit: Payer: Self-pay | Admitting: *Deleted

## 2013-06-04 ENCOUNTER — Ambulatory Visit (HOSPITAL_BASED_OUTPATIENT_CLINIC_OR_DEPARTMENT_OTHER): Payer: Medicare Other | Admitting: Hematology & Oncology

## 2013-06-04 ENCOUNTER — Telehealth: Payer: Self-pay | Admitting: *Deleted

## 2013-06-04 ENCOUNTER — Other Ambulatory Visit (HOSPITAL_BASED_OUTPATIENT_CLINIC_OR_DEPARTMENT_OTHER): Payer: Medicare Other | Admitting: Lab

## 2013-06-04 VITALS — BP 124/43 | HR 67 | Temp 97.7°F | Resp 18 | Ht 70.0 in | Wt 234.0 lb

## 2013-06-04 DIAGNOSIS — K922 Gastrointestinal hemorrhage, unspecified: Secondary | ICD-10-CM

## 2013-06-04 DIAGNOSIS — K921 Melena: Secondary | ICD-10-CM

## 2013-06-04 DIAGNOSIS — D500 Iron deficiency anemia secondary to blood loss (chronic): Secondary | ICD-10-CM

## 2013-06-04 DIAGNOSIS — D509 Iron deficiency anemia, unspecified: Secondary | ICD-10-CM

## 2013-06-04 DIAGNOSIS — D696 Thrombocytopenia, unspecified: Secondary | ICD-10-CM

## 2013-06-04 DIAGNOSIS — D649 Anemia, unspecified: Secondary | ICD-10-CM

## 2013-06-04 LAB — CBC WITH DIFFERENTIAL (CANCER CENTER ONLY)
BASO#: 0 10*3/uL (ref 0.0–0.2)
BASO%: 0.8 % (ref 0.0–2.0)
EOS%: 2.9 % (ref 0.0–7.0)
Eosinophils Absolute: 0.2 10*3/uL (ref 0.0–0.5)
HEMATOCRIT: 25 % — AB (ref 38.7–49.9)
HGB: 7.1 g/dL — ABNORMAL LOW (ref 13.0–17.1)
LYMPH#: 0.7 10*3/uL — ABNORMAL LOW (ref 0.9–3.3)
LYMPH%: 13.5 % — ABNORMAL LOW (ref 14.0–48.0)
MCH: 25.1 pg — ABNORMAL LOW (ref 28.0–33.4)
MCHC: 28.4 g/dL — AB (ref 32.0–35.9)
MCV: 88 fL (ref 82–98)
MONO#: 0.5 10*3/uL (ref 0.1–0.9)
MONO%: 9.9 % (ref 0.0–13.0)
NEUT#: 3.8 10*3/uL (ref 1.5–6.5)
NEUT%: 72.9 % (ref 40.0–80.0)
Platelets: 87 10*3/uL — ABNORMAL LOW (ref 145–400)
RBC: 2.83 10*6/uL — ABNORMAL LOW (ref 4.20–5.70)
RDW: 14.2 % (ref 11.1–15.7)
WBC: 5.2 10*3/uL (ref 4.0–10.0)

## 2013-06-04 LAB — CMP (CANCER CENTER ONLY)
ALBUMIN: 3.7 g/dL (ref 3.3–5.5)
ALK PHOS: 53 U/L (ref 26–84)
ALT(SGPT): 16 U/L (ref 10–47)
AST: 23 U/L (ref 11–38)
BUN: 26 mg/dL — AB (ref 7–22)
CO2: 26 mEq/L (ref 18–33)
CREATININE: 1.1 mg/dL (ref 0.6–1.2)
Calcium: 9.4 mg/dL (ref 8.0–10.3)
Chloride: 106 mEq/L (ref 98–108)
Glucose, Bld: 138 mg/dL — ABNORMAL HIGH (ref 73–118)
POTASSIUM: 4.3 meq/L (ref 3.3–4.7)
Sodium: 139 mEq/L (ref 128–145)
Total Bilirubin: 0.7 mg/dl (ref 0.20–1.60)
Total Protein: 7.4 g/dL (ref 6.4–8.1)

## 2013-06-04 LAB — CHCC SATELLITE - SMEAR

## 2013-06-04 MED ORDER — SODIUM CHLORIDE 0.9 % IV SOLN
Freq: Once | INTRAVENOUS | Status: AC
  Administered 2013-06-04: 10:00:00 via INTRAVENOUS

## 2013-06-04 MED ORDER — SODIUM CHLORIDE 0.9 % IV SOLN
1020.0000 mg | Freq: Once | INTRAVENOUS | Status: AC
  Administered 2013-06-04: 1020 mg via INTRAVENOUS
  Filled 2013-06-04: qty 34

## 2013-06-04 NOTE — Telephone Encounter (Signed)
Per Dr. Olevia Perches, patient needs ECOL direct ASAP. Spoke with Sharee Pimple at [facility] endo and scheduled patient on 06/10/13 at 12:00 PM for ECOL.  Called the [facility] at [facility] and spoke with patient's son. Gave him the appointment date and time. Scheduled pre visit on 06/07/13 at 1:00 PM.

## 2013-06-04 NOTE — Progress Notes (Signed)
This office note has been dictated.

## 2013-06-04 NOTE — Patient Instructions (Signed)

## 2013-06-05 NOTE — Progress Notes (Signed)
CC:   Lowella Bandy. Olevia Perches, MD  DIAGNOSES: 1. 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 2. Chronic thrombocytopenia.  CURRENT THERAPY: 1. The patient to receive IV iron today. 2. The patient will need a colonoscopy and/or upper endoscopy.  INTERIM HISTORY:  Mr. Barski comes in for a followup.  When we saw him back in October, I noted that his hemoglobin was further down.  We get him back today.  He says he has not noticed any obvious bleeding from his rectum.  He does take Voltaren.  He has had no abdominal pain.  He has had no obvious weight loss.  His appetite has been good.  He has had no melena. He has had no hematemesis.  PHYSICAL EXAMINATION:  General:  This is an elderly white gentleman in no obvious distress.  Vital Signs:  Temperature of 97.7, pulse 67, respiratory rate 18, blood pressure 124/43.  Weight is 234 pounds.  Head and Neck:  Normocephalic, atraumatic skull.  There are no ocular or oral lesions.  There are no palpable cervical or supraclavicular lymph nodes. Lungs:  Clear bilaterally.  He has no rales, wheezes, or rhonchi. Cardiac:  Regular rate and rhythm with a normal S1 and S2.  He has a 1/6 systolic ejection murmur.  Abdomen:  Soft.  He has good bowel sounds. There is no fluid wave.  There is no guarding or rebound tenderness. Rectal:  Shows no external hemorrhoids.  No obvious rectal masses noted. Stool is brown but grossly heme-positive.  Extremities:  Show some trace edema in his lower legs.  He has 4+/5 strength in his legs.  Skin:  No rashes, ecchymosis, or petechia.  LABORATORY STUDIES:  White cell count is 5.2, hemoglobin 7.1, hematocrit 25, platelet count 87,000.  MCV is 88.  On his peripheral smear, he has some mild anisocytosis and poikilocytosis.  He has no rouleaux formation.  I see no nucleated red blood cells.  There are no target cells.  White cells appear normal in morphology and maturation.  There is no immature myeloid  or lymphoid forms.  Platelets are decreased in number.  His platelets are well granulated.  He has several large platelets.  IMPRESSION:  Mr. Patitucci is a 80 year old gentleman.  He now has iron- deficiency anemia.  I think his platelet count is probably up because of his iron deficiency.  He clearly is going to need upper and lower endoscopy.  He takes Voltaren.  I told him to stop the Voltaren.  I spoke to Dr. Delfin Edis.  She will get him set up for an endoscopic evaluation.  I want to see Mr. Glawson back in 4-6 weeks.  I gave him Feraheme of 1020 mg today.  I want to try to avoid a blood transfusion if possible but this may be needed depending on how his blood counts go.  I spent a good 40 minutes or so with Mr. Lillquist today.  Thankfully, he comes with the son.    ______________________________ Volanda Napoleon, M.D. PRE/MEDQ  D:  06/04/2013  T:  06/05/2013  Job:  [id number]

## 2013-06-07 ENCOUNTER — Ambulatory Visit (AMBULATORY_SURGERY_CENTER): Payer: Self-pay | Admitting: *Deleted

## 2013-06-07 VITALS — Ht 70.0 in | Wt 236.2 lb

## 2013-06-07 DIAGNOSIS — D500 Iron deficiency anemia secondary to blood loss (chronic): Secondary | ICD-10-CM

## 2013-06-07 MED ORDER — MOVIPREP 100 G PO SOLR: ORAL | Status: DC

## 2013-06-07 NOTE — Progress Notes (Signed)
No allergies to eggs or soy. No problems with anesthesia.  

## 2013-06-09 ENCOUNTER — Encounter: Payer: Self-pay | Admitting: Internal Medicine

## 2013-06-09 LAB — ERYTHROPOIETIN: Erythropoietin: 138.8 m[IU]/mL — ABNORMAL HIGH (ref 2.6–18.5)

## 2013-06-09 LAB — RETICULOCYTES (CHCC)
ABS Retic: 79.4 10*3/uL (ref 19.0–186.0)
RBC.: 2.94 MIL/uL — ABNORMAL LOW (ref 4.22–5.81)
Retic Ct Pct: 2.7 % — ABNORMAL HIGH (ref 0.4–2.3)

## 2013-06-09 LAB — TRANSFERRIN RECEPTOR, SOLUABLE: Transferrin Receptor, Soluble: 4.25 mg/L — ABNORMAL HIGH (ref 0.76–1.76)

## 2013-06-09 NOTE — Interval H&P Note (Signed)
History and Physical Interval Note:  06/09/2013 9:30 PM  Gary Arch.  has presented today for surgery, with the diagnosis of Anemia [285.9] Blood in stool [578.1]  The various methods of treatment have been discussed with the patient and family. After consideration of risks, benefits and other options for treatment, the patient has consented to  Procedure(s): ESOPHAGOGASTRODUODENOSCOPY (EGD) (N/A) COLONOSCOPY (N/A) as a surgical intervention .  The patient's history has been reviewed, patient examined, no change in status, stable for surgery.  I have reviewed the patient's chart and labs.  Questions were answered to the patient's satisfaction.     Delfin Edis

## 2013-06-09 NOTE — H&P (View-Only) (Signed)
This office note has been dictated.

## 2013-06-10 ENCOUNTER — Ambulatory Visit (HOSPITAL_COMMUNITY)
Admission: RE | Admit: 2013-06-10 | Discharge: 2013-06-10 | Disposition: A | Discharge status: Home / Self Care | Payer: Medicare Other | Source: Ambulatory Visit | Attending: Internal Medicine | Admitting: Internal Medicine

## 2013-06-10 ENCOUNTER — Encounter: Payer: Self-pay | Admitting: Internal Medicine

## 2013-06-10 ENCOUNTER — Encounter (HOSPITAL_COMMUNITY): Payer: Self-pay | Admitting: *Deleted

## 2013-06-10 ENCOUNTER — Encounter (HOSPITAL_COMMUNITY)
Admission: RE | Disposition: A | Discharge status: Home / Self Care | Payer: Self-pay | Source: Ambulatory Visit | Attending: Internal Medicine

## 2013-06-10 DIAGNOSIS — K2970 Gastritis, unspecified, without bleeding: Secondary | ICD-10-CM | POA: Insufficient documentation

## 2013-06-10 DIAGNOSIS — I8500 Esophageal varices without bleeding: Secondary | ICD-10-CM | POA: Insufficient documentation

## 2013-06-10 DIAGNOSIS — D509 Iron deficiency anemia, unspecified: Secondary | ICD-10-CM | POA: Insufficient documentation

## 2013-06-10 DIAGNOSIS — R195 Other fecal abnormalities: Secondary | ICD-10-CM | POA: Insufficient documentation

## 2013-06-10 DIAGNOSIS — Q2733 Arteriovenous malformation of digestive system vessel: Secondary | ICD-10-CM

## 2013-06-10 DIAGNOSIS — K2990 Gastroduodenitis, unspecified, without bleeding: Secondary | ICD-10-CM

## 2013-06-10 DIAGNOSIS — R161 Splenomegaly, not elsewhere classified: Secondary | ICD-10-CM | POA: Insufficient documentation

## 2013-06-10 DIAGNOSIS — D126 Benign neoplasm of colon, unspecified: Secondary | ICD-10-CM | POA: Insufficient documentation

## 2013-06-10 DIAGNOSIS — K5520 Angiodysplasia of colon without hemorrhage: Secondary | ICD-10-CM | POA: Insufficient documentation

## 2013-06-10 DIAGNOSIS — D696 Thrombocytopenia, unspecified: Secondary | ICD-10-CM

## 2013-06-10 DIAGNOSIS — K921 Melena: Secondary | ICD-10-CM

## 2013-06-10 DIAGNOSIS — D649 Anemia, unspecified: Secondary | ICD-10-CM

## 2013-06-10 HISTORY — PX: COLONOSCOPY: SHX5424

## 2013-06-10 HISTORY — PX: ESOPHAGOGASTRODUODENOSCOPY: SHX5428

## 2013-06-10 LAB — GLUCOSE, CAPILLARY: GLUCOSE-CAPILLARY: 119 mg/dL — AB (ref 70–99)

## 2013-06-10 SURGERY — EGD (ESOPHAGOGASTRODUODENOSCOPY)
Anesthesia: Moderate Sedation

## 2013-06-10 MED ORDER — SODIUM CHLORIDE 0.9 % IV SOLN
INTRAVENOUS | Status: DC
  Administered 2013-06-10: 500 mL via INTRAVENOUS

## 2013-06-10 MED ORDER — BUTAMBEN-TETRACAINE-BENZOCAINE 2-2-14 % EX AERO
INHALATION_SPRAY | CUTANEOUS | Status: DC | PRN
  Administered 2013-06-10: 2 via TOPICAL

## 2013-06-10 MED ORDER — ATROPINE SULFATE 0.4 MG/ML IJ SOLN
INTRAMUSCULAR | Status: AC
  Filled 2013-06-10: qty 1

## 2013-06-10 MED ORDER — FENTANYL CITRATE 0.05 MG/ML IJ SOLN
INTRAMUSCULAR | Status: AC
  Filled 2013-06-10: qty 2

## 2013-06-10 MED ORDER — MIDAZOLAM HCL 10 MG/2ML IJ SOLN
INTRAMUSCULAR | Status: AC
  Filled 2013-06-10: qty 4

## 2013-06-10 MED ORDER — FENTANYL CITRATE 0.05 MG/ML IJ SOLN
INTRAMUSCULAR | Status: DC | PRN
  Administered 2013-06-10 (×3): 25 ug via INTRAVENOUS

## 2013-06-10 MED ORDER — MIDAZOLAM HCL 10 MG/2ML IJ SOLN
INTRAMUSCULAR | Status: DC | PRN
  Administered 2013-06-10 (×3): 2 mg via INTRAVENOUS

## 2013-06-10 NOTE — Interval H&P Note (Signed)
History and Physical Interval Note:  06/10/2013 10:07 AM  Gary Ingram.  has presented today for surgery, with the diagnosis of Anemia [285.9] Blood in stool [578.1]  The various methods of treatment have been discussed with the patient and family. After consideration of risks, benefits and other options for treatment, the patient has consented to  Procedure(s): ESOPHAGOGASTRODUODENOSCOPY (EGD) (N/A) COLONOSCOPY (N/A) as a surgical intervention .  The patient's history has been reviewed, patient examined, no change in status, stable for surgery.  I have reviewed the patient's chart and labs.  Questions were answered to the patient's satisfaction.     Delfin Edis

## 2013-06-10 NOTE — Op Note (Signed)
[facility] [place], [address]   ENDOSCOPY PROCEDURE REPORT  PATIENT: Gary Ingram, Gary Ingram.  MR#: [id number] BIRTHDATE: 10-May-1932 , 80  yrs. old GENDER: Male ENDOSCOPIST: Lafayette Dragon, MD REFERRED BY:  Burney Gauze, M.D., Dr Lang Snow PROCEDURE DATE:  06/10/2013 PROCEDURE:  EGD w/ biopsy ASA CLASS:     Class III INDICATIONS:  Iron deficiency anemia.   Occult blood positive.,prior colon  within last 5 years as reported by the pt, splenomegaly MEDICATIONS: These medications were titrated to patient response per physician's verbal order, Fentanyl 50 mcg IV, and Versed 4 mg IV TOPICAL ANESTHETIC: Cetacaine Spray  DESCRIPTION OF PROCEDURE: After the risks benefits and alternatives of the procedure were thoroughly explained, informed consent was obtained.  The Pentax Gastroscope [id number] endoscope was introduced through the mouth and advanced to the second portion of the duodenum. Without limitations.  The instrument was slowly withdrawn as the mucosa was fully examined.      Esophagus[ : proximal esophageal mucosa was normal. The distal two thirds of the esophagus showed 2 plus esophageal varices. There were 3 separate strains of esophageal varices extending into the GE junction. There were no cherry-red spots or any stigmata of recent bleeding. There was no esophagitis stomach: Gastric mucosa appeared normal with exception of mild erythema in the gastric antrum. There were no gastric varices on retroflexion. Gastric outlet was normal. There was no hypertensive gastropathy Duodenum: Duodenal bulb and descending duodenum was normal. There were no duodenal varices     The scope was then withdrawn from the patient and the procedure completed.  COMPLICATIONS: There were no complications. ENDOSCOPIC IMPRESSION:  second grade esophageal varices with no stigmata of bleeding. Indicating portal hypertension No gastric varices No duodenal  varices Minimal antral gastritis. Status post biopsies  RECOMMENDATIONS: 1.  Await pathology results 2.   Proceed with colonoscopy 3.chronic acid suppression 4. Consider beta blocker the lower portal pressure REPEAT EXAM: for EGD pending biopsy results.  eSigned:  Lafayette Dragon, MD 06/10/2013 1:13 PM   CC:  PATIENT NAME:  [name]. MR#: [id number]

## 2013-06-10 NOTE — Op Note (Signed)
[facility] [place], [address]   COLONOSCOPY PROCEDURE REPORT  PATIENT: Gary Ingram, Gary Ingram.  MR#: [id number] BIRTHDATE: Oct 18, 1932 , 80  yrs. old GENDER: Male ENDOSCOPIST: Lafayette Dragon, MD REFERRED [id number] Marin Olp, M.D. , Dr Lang Snow PROCEDURE DATE:  06/10/2013 PROCEDURE:   Colonoscopy with snare polypectomy ,APC ablation of cecal avm's x2 First Screening Colonoscopy - Avg.  risk and is 50 yrs.  old or older - No.  Prior Negative Screening - Now for repeat screening. N/A  History of Adenoma - Now for follow-up colonoscopy & has been > or = to 3 yrs.  N/A  Polyps Removed Today? Yes. ASA CLASS:   Class III INDICATIONS:Iron Deficiency Anemia, heme-positive stool, and splenomegaly, normal appearing liver on CT scan, normal LFTs. MEDICATIONS: These medications were titrated to patient response per physician's verbal order, Fentanyl 25 mcg IV, and Versed 4 mg IV  DESCRIPTION OF PROCEDURE:   After the risks benefits and alternatives of the procedure were thoroughly explained, informed consent was obtained.  A digital rectal exam revealed no abnormalities of the rectum.   The Pentax Ped Colon [id number] endoscope was introduced through the anus and advanced to the cecum, which was identified by both the appendix and ileocecal valve. No adverse events experienced.   The quality of the prep was good, using MoviPrep  The instrument was then slowly withdrawn as the colon was fully examined.      COLON FINDINGS: A smooth sessile polyp ranging between 5-66mm in size was found in the descending colon.  A polypectomy was performed with a cold snare.  The resection was complete and the polyp tissue was completely retrieved. 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 There were ablated with the APC coagulator. There was no bleeding from thablated tidssue Retroflexed views revealed no abnormalities. The time to  cecum=6 minutes 20 seconds.  Withdrawal time=12 minutes 30 seconds.  The scope was withdrawn and the procedure completed. COMPLICATIONS: There were no complications.  ENDOSCOPIC IMPRESSION: 1.   Sessile polyp ranging between 5-39mm in size was found in the descending colon; polypectomy was performed with a cold snare 2.   AV malformations of the cecum. Status post ablation with APC electrode,  RECOMMENDATIONS: 1.  Await pathology results 2.  heme-positive  anemia likely related to portal hypertension.  I suspect the patient also has small bowel AVMs.  I have discussed the findings with Dr.  Marin Olp and we will consider small bowel capsule endoscopy if bleeding continues.  Suggest evaluation for chronic liver disease which has not been recognized up to this point.   eSigned:  Lafayette Dragon, MD 06/10/2013 1:26 PM   cc:   PATIENT NAME:  Gary Ingram. MR#: [id number]

## 2013-06-10 NOTE — Discharge Instructions (Signed)
Colonoscopy Care After These instructions give you information on caring for yourself after your procedure. Your doctor may also give you more specific instructions. Call your doctor if you have any problems or questions after your procedure. HOME CARE  Take it easy for the next 24 hours.  Rest.  Walk or use warm packs on your belly (abdomen) if you have belly cramping or gas.  Do not drive for 24 hours.  You may shower.  Do not sign important papers or use machinery for 24 hours.  Drink enough fluids to keep your pee (urine) clear or pale yellow.  Resume your normal diet. Avoid heavy or fried foods.  Avoid alcohol.  Continue taking your normal medicines.  Only take medicine as told by your doctor. Do not take aspirin. If you had growths (polyps) removed:  Do not take aspirin.  Do not drink alcohol for 7 days or as told by your doctor.  Eat a soft diet for 24 hours. GET HELP RIGHT AWAY IF:  You have a fever.  You pass clumps of tissue (blood clots) or fill the toilet with blood.  You have belly pain that gets worse and medicine does not help.  Your belly is puffy (swollen).  You feel sick to your stomach (nauseous) or throw up (vomit). MAKE SURE YOU:  Understand these instructions.  Will watch your condition.  Will get help right away if you are not doing well or get worse. Document Released: 05/18/2010 Document Revised: 07/08/2011 Document Reviewed: 12/21/2012 [facility] Patient Information 2014 [place]. Moderate Sedation, Adult Moderate sedation is given to help you relax or even sleep through a procedure. You may remain sleepy, be clumsy, or have poor balance for several hours following this procedure. Arrange for a responsible adult, family member, or friend to take you home. A responsible adult should stay with you for at least 24 hours or until the medicines have worn off.  Do not participate in any activities where you could become injured for the  next 24 hours, or until you feel normal again. Do not:  Drive.  Swim.  Ride a bicycle.  Operate heavy machinery.  Cook.  Use power tools.  Climb ladders.  Work at [employer].  Do not make important decisions or sign legal documents until you are improved.  Vomiting may occur if you eat too soon. When you can drink without vomiting, try water, juice, or soup. Try solid foods if you feel little or no nausea.  Only take over-the-counter or prescription medications for pain, discomfort, or fever as directed by your caregiver.If pain medications have been prescribed for you, ask your caregiver how soon it is safe to take them.  Make sure you and your family fully understands everything about the medication given to you. Make sure you understand what side effects may occur.  You should not drink alcohol, take sleeping pills, or medications that cause drowsiness for at least 24 hours.  If you smoke, do not smoke alone.  If you are feeling better, you may resume normal activities 24 hours after receiving sedation.  Keep all appointments as scheduled. Follow all instructions.  Ask questions if you do not understand. SEEK MEDICAL CARE IF:   Your skin is pale or bluish in color.  You continue to feel sick to your stomach (nauseous) or throw up (vomit).  Your pain is getting worse and not helped by medication.  You have bleeding or swelling.  You are still sleepy or feeling clumsy after 24  hours. SEEK IMMEDIATE MEDICAL CARE IF:   You develop a rash.  You have difficulty breathing.  You develop any type of allergic problem.  You have a fever. Document Released: 01/08/2001 Document Revised: 07/08/2011 Document Reviewed: 12/21/2012 [facility] Patient Information 2014 [place].

## 2013-06-11 ENCOUNTER — Encounter (HOSPITAL_COMMUNITY): Payer: Self-pay | Admitting: Internal Medicine

## 2013-06-12 ENCOUNTER — Encounter: Payer: Self-pay | Admitting: Internal Medicine

## 2013-06-14 ENCOUNTER — Other Ambulatory Visit: Payer: Self-pay | Admitting: *Deleted

## 2013-06-14 MED ORDER — AMOXICILLIN 500 MG PO CAPS: ORAL_CAPSULE | ORAL | Status: DC

## 2013-06-14 MED ORDER — OMEPRAZOLE 40 MG PO CPDR: DELAYED_RELEASE_CAPSULE | ORAL | Status: DC

## 2013-06-14 MED ORDER — CLARITHROMYCIN 500 MG PO TABS: ORAL_TABLET | ORAL | Status: DC

## 2013-06-30 ENCOUNTER — Telehealth: Payer: Self-pay | Admitting: Hematology & Oncology

## 2013-06-30 NOTE — Telephone Encounter (Signed)
Pt moved 3-6 to 3-30

## 2013-07-02 ENCOUNTER — Ambulatory Visit: Payer: Medicare Other | Admitting: Hematology & Oncology

## 2013-07-02 ENCOUNTER — Ambulatory Visit: Payer: Medicare Other

## 2013-07-02 ENCOUNTER — Other Ambulatory Visit: Payer: Medicare Other | Admitting: Lab

## 2013-07-26 ENCOUNTER — Ambulatory Visit (HOSPITAL_BASED_OUTPATIENT_CLINIC_OR_DEPARTMENT_OTHER): Payer: Medicare Other | Admitting: Hematology & Oncology

## 2013-07-26 ENCOUNTER — Other Ambulatory Visit (HOSPITAL_BASED_OUTPATIENT_CLINIC_OR_DEPARTMENT_OTHER): Payer: Medicare Other | Admitting: Lab

## 2013-07-26 ENCOUNTER — Ambulatory Visit: Payer: Medicare Other

## 2013-07-26 ENCOUNTER — Encounter: Payer: Self-pay | Admitting: Hematology & Oncology

## 2013-07-26 VITALS — BP 150/61 | HR 61 | Temp 97.5°F | Resp 61 | Ht 69.0 in | Wt 231.0 lb

## 2013-07-26 DIAGNOSIS — D500 Iron deficiency anemia secondary to blood loss (chronic): Secondary | ICD-10-CM

## 2013-07-26 DIAGNOSIS — D696 Thrombocytopenia, unspecified: Secondary | ICD-10-CM

## 2013-07-26 DIAGNOSIS — I8500 Esophageal varices without bleeding: Secondary | ICD-10-CM

## 2013-07-26 DIAGNOSIS — K922 Gastrointestinal hemorrhage, unspecified: Secondary | ICD-10-CM

## 2013-07-26 DIAGNOSIS — M25561 Pain in right knee: Secondary | ICD-10-CM

## 2013-07-26 LAB — CBC WITH DIFFERENTIAL (CANCER CENTER ONLY)
BASO#: 0 10*3/uL (ref 0.0–0.2)
BASO%: 0.6 % (ref 0.0–2.0)
EOS ABS: 0.1 10*3/uL (ref 0.0–0.5)
EOS%: 3 % (ref 0.0–7.0)
HEMATOCRIT: 34 % — AB (ref 38.7–49.9)
HEMOGLOBIN: 11 g/dL — AB (ref 13.0–17.1)
LYMPH#: 0.7 10*3/uL — AB (ref 0.9–3.3)
LYMPH%: 14.5 % (ref 14.0–48.0)
MCH: 31.1 pg (ref 28.0–33.4)
MCHC: 32.4 g/dL (ref 32.0–35.9)
MCV: 96 fL (ref 82–98)
MONO#: 0.4 10*3/uL (ref 0.1–0.9)
MONO%: 9.2 % (ref 0.0–13.0)
NEUT#: 3.4 10*3/uL (ref 1.5–6.5)
NEUT%: 72.7 % (ref 40.0–80.0)
Platelets: 67 10*3/uL — ABNORMAL LOW (ref 145–400)
RBC: 3.54 10*6/uL — AB (ref 4.20–5.70)
RDW: 18.6 % — ABNORMAL HIGH (ref 11.1–15.7)
WBC: 4.7 10*3/uL (ref 4.0–10.0)

## 2013-07-26 LAB — IRON AND TIBC CHCC
%SAT: 19 % — AB (ref 20–55)
Iron: 74 ug/dL (ref 42–163)
TIBC: 384 ug/dL (ref 202–409)
UIBC: 310 ug/dL (ref 117–376)

## 2013-07-26 LAB — CHCC SATELLITE - SMEAR

## 2013-07-26 LAB — RETICULOCYTES (CHCC)
ABS Retic: 71.6 10*3/uL (ref 19.0–186.0)
RBC.: 3.58 MIL/uL — ABNORMAL LOW (ref 4.22–5.81)
Retic Ct Pct: 2 % (ref 0.4–2.3)

## 2013-07-26 LAB — FERRITIN CHCC: FERRITIN: 24 ng/mL (ref 22–316)

## 2013-07-26 LAB — HOLD TUBE, BLOOD BANK - CHCC SATELLITE

## 2013-07-26 MED ORDER — TRAMADOL HCL 50 MG PO TABS: 50.0000 mg | ORAL_TABLET | Freq: Four times a day (QID) | ORAL | Status: DC | PRN

## 2013-07-26 NOTE — Addendum Note (Signed)
Addended by: Burney Gauze R on: 03/12/2014 10:00 AM   Modules accepted: Orders

## 2013-07-26 NOTE — Addendum Note (Signed)
Addended by: Trevor Mace on: May 25, 202015 11:58 AM   Modules accepted: Orders

## 2013-07-26 NOTE — Progress Notes (Signed)
Hematology and Oncology Follow Up Visit  Myquan Schaumburg [id number] 25-May-1932 80 y.o. 12/19/202015   Principle Diagnosis:   GI bleeding secondary to Helicobacter gastritis and varices Chronic thrombocytopenia Portal hypertension    Current Therapy:    IV iron for anemia secondary to GI blood loss     Interim History:  Mr.  Kroh is back for followup. We saw him in February, his anemia was quite marked. Get GI bleeding. He [facility]. They did an upper endoscopy and colonoscopy. He was found to have varices. His found to have Helicobacter infection. There were also some AVM's. He is on acid blocking medicine now. He has pain in his right knee. He wants to go back onto his arthritic medicine. I told him that this really would not be feasible right now. I will try him on some Ultram.  He's had no obvious bleeding. There's been no melena. He's had no weight loss or weight gain.  I think the upper endoscopy was very helpful in that he does have portal hypertension and likely cirrhosis which is probably secondary to NASH.    Medications: Current outpatient prescriptions:UNKNOWN TO PATIENT, 07-26-13  Pt unaware of what med's he takes, states he will call me when he gets home and ready off what med's he has.Phone # name given to patient., Disp: , Rfl: ;  amoxicillin (AMOXIL) 500 MG capsule, Take one po TID x 10 days, Disp: 30 capsule, Rfl: 0;  clarithromycin (BIAXIN) 500 MG tablet, Take one po BID x 10 days, Disp: 20 tablet, Rfl: 0 diclofenac (VOLTAREN) 75 MG EC tablet, Take 75 mg by mouth 2 (two) times daily., Disp: , Rfl: ;  Iron Combinations (HEMATOGEN PO), Take by mouth., Disp: , Rfl: ;  iron polysaccharides (POLY-IRON 150) 150 MG capsule, Take 150 mg by mouth every morning., Disp: , Rfl: ;  losartan-hydrochlorothiazide (HYZAAR) 50-12.5 MG per tablet, Take 1 tablet by mouth daily. , Disp: , Rfl:  meclizine (ANTIVERT) 25 MG tablet, Take 25 mg by mouth 3 (three) times daily as  needed., Disp: , Rfl: ;  metFORMIN (GLUCOPHAGE) 500 MG tablet, Take 500 mg by mouth 2 (two) times daily with a meal., Disp: , Rfl: ;  metoprolol (LOPRESSOR) 50 MG tablet, 50 mg 2 (two) times daily. , Disp: , Rfl: ;  MOVIPREP 100 G SOLR, moviprep as directed. No substitutions, Disp: 1 kit, Rfl: 0 niacin-simvastatin (SIMCOR) 1000-20 MG 24 hr tablet, Take 1 tablet by mouth at bedtime., Disp: , Rfl: ;  omeprazole (PRILOSEC) 40 MG capsule, Take one po BID x 10 days, Disp: 20 capsule, Rfl: 0;  ONE TOUCH ULTRA TEST test strip, every morning., Disp: , Rfl:   Allergies: No Known Allergies  Past Medical History, Surgical history, Social history, and Family History were reviewed and updated.  Review of Systems: As above  Physical Exam:  height is 5' 9"  (1.753 m) and weight is 231 lb (104.781 kg). His oral temperature is 97.5 F (36.4 C). His blood pressure is 150/61 and his pulse is 61. His respiration is 61.   Lungs are clear. Cardiac exam regular rate and rhythm. Head and neck exam shows no adenopathy. Abdomen is soft. He is mildly obese. He has good bowel sounds. There is no guarding or rebound tenderness. There is no palpable liver or spleen tip. Back exam no tenderness over the spine. Extremities shows no clubbing cyanosis or edema.  Lab Results  Component Value Date   WBC 4.7 12/19/202015   HGB 11.0*  03/25/2014   HCT 34.0* 03/25/2014   MCV 96 03/25/2014   PLT 67* 03/25/2014     Chemistry      Component Value Date/Time   NA 139 06/04/2013 0810   NA 137 11/18/2008 1333   K 4.3 06/04/2013 0810   K 3.9 11/18/2008 1333   CL 106 06/04/2013 0810   CL 105 11/18/2008 1333   CO2 26 06/04/2013 0810   CO2 26 11/18/2008 1333   BUN 26* 06/04/2013 0810   BUN 13 11/18/2008 1333   CREATININE 1.1 06/04/2013 0810   CREATININE 0.74 11/18/2008 1333      Component Value Date/Time   CALCIUM 9.4 06/04/2013 0810   CALCIUM 9.1 11/18/2008 1333   ALKPHOS 53 06/04/2013 0810   AST 23 06/04/2013 0810   ALT 16 06/04/2013 0810   BILITOT 0.70  06/04/2013 0810         Impression and Plan: Mr. Kassis is a 80 year old gentleman. He has chronic thrombocytopenia again, he has portal hypertension and cirrhosis with functional hypersplenism that is the cause for the thrombocytopenia.  We found that he had GI bleeding. He got iron. He's on oral iron. This is improving nicely.  I went over his lab work with him. I explained to him what was going on. I showed him the pathology reports. There is no malignancy.  We can probably get him back now in 3 months.  We will have to check his iron studies whenever we see him.   Volanda Napoleon, MD 11/27/20159:50 AM

## 2013-08-06 ENCOUNTER — Ambulatory Visit: Payer: Medicare Other | Admitting: Hematology & Oncology

## 2013-08-06 ENCOUNTER — Other Ambulatory Visit: Payer: Medicare Other | Admitting: Lab

## 2013-08-12 ENCOUNTER — Encounter: Payer: Self-pay | Admitting: Internal Medicine

## 2013-08-12 ENCOUNTER — Encounter: Payer: Self-pay | Admitting: *Deleted

## 2013-10-15 ENCOUNTER — Ambulatory Visit: Payer: Medicare Other | Admitting: Internal Medicine

## 2013-10-22 ENCOUNTER — Other Ambulatory Visit: Payer: Medicare Other | Admitting: Lab

## 2013-10-22 ENCOUNTER — Ambulatory Visit (HOSPITAL_BASED_OUTPATIENT_CLINIC_OR_DEPARTMENT_OTHER): Payer: Medicare Other | Admitting: Hematology & Oncology

## 2013-10-22 ENCOUNTER — Encounter: Payer: Self-pay | Admitting: Hematology & Oncology

## 2013-10-22 VITALS — BP 145/71 | HR 68 | Temp 97.6°F | Resp 18 | Ht 69.0 in | Wt 234.0 lb

## 2013-10-22 DIAGNOSIS — I8500 Esophageal varices without bleeding: Secondary | ICD-10-CM

## 2013-10-22 DIAGNOSIS — D696 Thrombocytopenia, unspecified: Secondary | ICD-10-CM

## 2013-10-22 LAB — CBC WITH DIFFERENTIAL (CANCER CENTER ONLY)
BASO#: 0 10*3/uL (ref 0.0–0.2)
BASO%: 0.4 % (ref 0.0–2.0)
EOS%: 2.4 % (ref 0.0–7.0)
Eosinophils Absolute: 0.1 10*3/uL (ref 0.0–0.5)
HCT: 32.9 % — ABNORMAL LOW (ref 38.7–49.9)
HEMOGLOBIN: 11.3 g/dL — AB (ref 13.0–17.1)
LYMPH#: 0.5 10*3/uL — ABNORMAL LOW (ref 0.9–3.3)
LYMPH%: 10.1 % — AB (ref 14.0–48.0)
MCH: 33 pg (ref 28.0–33.4)
MCHC: 34.3 g/dL (ref 32.0–35.9)
MCV: 96 fL (ref 82–98)
MONO#: 0.4 10*3/uL (ref 0.1–0.9)
MONO%: 8.6 % (ref 0.0–13.0)
NEUT#: 3.7 10*3/uL (ref 1.5–6.5)
NEUT%: 78.5 % (ref 40.0–80.0)
Platelets: 62 10*3/uL — ABNORMAL LOW (ref 145–400)
RBC: 3.42 10*6/uL — ABNORMAL LOW (ref 4.20–5.70)
RDW: 13.9 % (ref 11.1–15.7)
WBC: 4.7 10*3/uL (ref 4.0–10.0)

## 2013-10-22 LAB — COMPREHENSIVE METABOLIC PANEL
ALK PHOS: 45 U/L (ref 39–117)
ALT: 14 U/L (ref 0–53)
AST: 24 U/L (ref 0–37)
Albumin: 4.4 g/dL (ref 3.5–5.2)
BILIRUBIN TOTAL: 0.7 mg/dL (ref 0.2–1.2)
BUN: 23 mg/dL (ref 6–23)
CO2: 23 meq/L (ref 19–32)
Calcium: 9.5 mg/dL (ref 8.4–10.5)
Chloride: 104 mEq/L (ref 96–112)
Creatinine, Ser: 1.23 mg/dL (ref 0.50–1.35)
Glucose, Bld: 138 mg/dL — ABNORMAL HIGH (ref 70–99)
Potassium: 4.4 mEq/L (ref 3.5–5.3)
SODIUM: 139 meq/L (ref 135–145)
TOTAL PROTEIN: 7.1 g/dL (ref 6.0–8.3)

## 2013-10-22 LAB — FERRITIN CHCC: FERRITIN: 15 ng/mL — AB (ref 22–316)

## 2013-10-22 LAB — IRON AND TIBC CHCC
%SAT: 31 % (ref 20–55)
Iron: 125 ug/dL (ref 42–163)
TIBC: 403 ug/dL (ref 202–409)
UIBC: 278 ug/dL (ref 117–376)

## 2013-10-22 LAB — CHCC SATELLITE - SMEAR

## 2013-10-22 NOTE — Progress Notes (Signed)
Hematology and Oncology Follow Up Visit  Gary Ingram [id number] [birth year] 80 y.o. 10/22/2013   Principle Diagnosis:   GI bleeding secondary to Helicobacter gastritis and varices Chronic thrombocytopenia Portal hypertension  Current Therapy:    IV iron for anemia secondary to GI blood loss     Interim History:  Gary Ingram is back for followup. Do further well. We last saw him come through some problems with GI bleeding. He does have some varices. He's on a have a Helicobacter infection.  He feels well. There is no nausea or vomiting. He's had no bleeding. There's been no change in bowel or bladder habits. He has had no issues with his diabetes.  There has been no cough. He has had no leg swelling. There have been no rashes.  Medications: Current outpatient prescriptions:diclofenac (VOLTAREN) 75 MG EC tablet, Take 75 mg by mouth 2 (two) times daily., Disp: , Rfl: ;  losartan-hydrochlorothiazide (HYZAAR) 50-12.5 MG per tablet, Take 1 tablet by mouth daily. , Disp: , Rfl: ;  meclizine (ANTIVERT) 25 MG tablet, Take 25 mg by mouth 3 (three) times daily as needed., Disp: , Rfl: ;  metFORMIN (GLUCOPHAGE) 500 MG tablet, Take 500 mg by mouth 2 (two) times daily with a meal., Disp: , Rfl:  metoprolol (LOPRESSOR) 50 MG tablet, 50 mg 2 (two) times daily. , Disp: , Rfl: ;  Multiple Vitamins-Minerals (CENTRUM ADULTS) TABS, Take by mouth every morning., Disp: , Rfl: ;  niacin-simvastatin (SIMCOR) 1000-20 MG 24 hr tablet, Take 1 tablet by mouth at bedtime., Disp: , Rfl: ;  ONE TOUCH ULTRA TEST test strip, every morning., Disp: , Rfl: ;  Polysaccharide Iron Complex (NIFEREX PO), Take by mouth every morning. 50-12.5 mg, Disp: , Rfl:  traMADol (ULTRAM) 50 MG tablet, Take 1 tablet (50 mg total) by mouth every 6 (six) hours as needed., Disp: 60 tablet, Rfl: 2  Allergies: No Known Allergies  Past Medical History, Surgical history, Social history, and Family History were reviewed and updated.  Review of  Systems: As above  Physical Exam:  height is 5\' 9"  (1.753 m) and weight is 234 lb (106.142 kg). His oral temperature is 97.6 F (36.4 C). His blood pressure is 145/71 and his pulse is 68. His respiration is 18.   Totally done. Head and neck exam shows no ocular or oral lesions. There are no palpable cervical or supraclavicular lymph nodes. Lungs are clear bilaterally. Cardiac exam regular in rhythm with no murmurs rubs or bruits. Abdomen is soft. Has good bowel sounds. There is no fluid wave. There is no palpable liver or spleen tip. Back exam no tenderness over the spine ribs or hips. Extremities shows no clubbing cyanosis or edema. Skin exam no rashes.  Lab Results  Component Value Date   WBC 4.7 10/22/2013   HGB 11.3* 10/22/2013   HCT 32.9* 10/22/2013   MCV 96 10/22/2013   PLT 62* 10/22/2013     Chemistry      Component Value Date/Time   NA 139 06/04/2013 0810   NA 137 11/18/2008 1333   K 4.3 06/04/2013 0810   K 3.9 11/18/2008 1333   CL 106 06/04/2013 0810   CL 105 11/18/2008 1333   CO2 26 06/04/2013 0810   CO2 26 11/18/2008 1333   BUN 26* 06/04/2013 0810   BUN 13 11/18/2008 1333   CREATININE 1.1 06/04/2013 0810   CREATININE 0.74 11/18/2008 1333      Component Value Date/Time   CALCIUM 9.4 06/04/2013 0810  CALCIUM 9.1 11/18/2008 1333   ALKPHOS 53 06/04/2013 0810   AST 23 06/04/2013 0810   ALT 16 06/04/2013 0810   BILITOT 0.70 06/04/2013 0810      2  Impression and Plan: Gary Ingram is 80 year old gentleman. His chronic thrombocytopenia. He does have some portal hypertension. I suspect that there probably is some degree of cirrhosis and functional splenomegaly.  For now, he is asymptomatic. He is not bleeding. His hemoglobin is stable.  We will plan to get him back to see [place] in about 4 months now.   Volanda Napoleon, MD 6/26/20158:52 AM

## 2013-10-27 ENCOUNTER — Telehealth: Payer: Self-pay | Admitting: *Deleted

## 2013-10-27 ENCOUNTER — Other Ambulatory Visit: Payer: Self-pay | Admitting: *Deleted

## 2013-10-27 DIAGNOSIS — D509 Iron deficiency anemia, unspecified: Secondary | ICD-10-CM

## 2013-10-27 NOTE — Telephone Encounter (Signed)
Called patient to tell him that his iron is dropping and needs a dose of iron per dr. Marin Olp.  Attempted to reach his son but number not going through.  Patient going to reach his son and have him call us  To make this appointment for iron infusion.

## 2013-10-28 ENCOUNTER — Telehealth: Payer: Self-pay | Admitting: Hematology & Oncology

## 2013-10-28 NOTE — Telephone Encounter (Signed)
Pt aware of 7-16 iron infusion

## 2013-11-11 ENCOUNTER — Ambulatory Visit (HOSPITAL_BASED_OUTPATIENT_CLINIC_OR_DEPARTMENT_OTHER): Payer: Medicare Other

## 2013-11-11 VITALS — BP 154/69 | HR 65 | Temp 97.1°F | Resp 20

## 2013-11-11 DIAGNOSIS — D500 Iron deficiency anemia secondary to blood loss (chronic): Secondary | ICD-10-CM

## 2013-11-11 DIAGNOSIS — D509 Iron deficiency anemia, unspecified: Secondary | ICD-10-CM

## 2013-11-11 DIAGNOSIS — K922 Gastrointestinal hemorrhage, unspecified: Secondary | ICD-10-CM

## 2013-11-11 MED ORDER — SODIUM CHLORIDE 0.9 % IV SOLN
1020.0000 mg | Freq: Once | INTRAVENOUS | Status: AC
  Administered 2013-11-11: 1020 mg via INTRAVENOUS
  Filled 2013-11-11: qty 34

## 2013-11-11 MED ORDER — SODIUM CHLORIDE 0.9 % IV SOLN
Freq: Once | INTRAVENOUS | Status: AC
  Administered 2013-11-11: 12:00:00 via INTRAVENOUS

## 2013-11-11 NOTE — Patient Instructions (Signed)

## 2014-02-25 ENCOUNTER — Encounter: Payer: Self-pay | Admitting: Hematology & Oncology

## 2014-02-25 ENCOUNTER — Ambulatory Visit (HOSPITAL_BASED_OUTPATIENT_CLINIC_OR_DEPARTMENT_OTHER): Payer: Medicare Other | Admitting: Hematology & Oncology

## 2014-02-25 ENCOUNTER — Other Ambulatory Visit (HOSPITAL_BASED_OUTPATIENT_CLINIC_OR_DEPARTMENT_OTHER): Payer: Medicare Other | Admitting: Lab

## 2014-02-25 VITALS — BP 126/61 | HR 60 | Temp 97.6°F | Resp 18 | Ht 69.0 in | Wt 238.0 lb

## 2014-02-25 DIAGNOSIS — D500 Iron deficiency anemia secondary to blood loss (chronic): Secondary | ICD-10-CM

## 2014-02-25 DIAGNOSIS — E119 Type 2 diabetes mellitus without complications: Secondary | ICD-10-CM

## 2014-02-25 DIAGNOSIS — K766 Portal hypertension: Secondary | ICD-10-CM

## 2014-02-25 DIAGNOSIS — I8500 Esophageal varices without bleeding: Secondary | ICD-10-CM

## 2014-02-25 DIAGNOSIS — D696 Thrombocytopenia, unspecified: Secondary | ICD-10-CM

## 2014-02-25 LAB — IRON AND TIBC CHCC
%SAT: 21 % (ref 20–55)
Iron: 69 ug/dL (ref 42–163)
TIBC: 333 ug/dL (ref 202–409)
UIBC: 264 ug/dL (ref 117–376)

## 2014-02-25 LAB — CBC WITH DIFFERENTIAL (CANCER CENTER ONLY)
BASO#: 0 10*3/uL (ref 0.0–0.2)
BASO%: 0.6 % (ref 0.0–2.0)
EOS%: 3.9 % (ref 0.0–7.0)
Eosinophils Absolute: 0.2 10*3/uL (ref 0.0–0.5)
HCT: 33 % — ABNORMAL LOW (ref 38.7–49.9)
HGB: 11.5 g/dL — ABNORMAL LOW (ref 13.0–17.1)
LYMPH#: 0.7 10*3/uL — ABNORMAL LOW (ref 0.9–3.3)
LYMPH%: 13.1 % — AB (ref 14.0–48.0)
MCH: 35.2 pg — AB (ref 28.0–33.4)
MCHC: 34.8 g/dL (ref 32.0–35.9)
MCV: 101 fL — AB (ref 82–98)
MONO#: 0.6 10*3/uL (ref 0.1–0.9)
MONO%: 10.9 % (ref 0.0–13.0)
NEUT#: 3.8 10*3/uL (ref 1.5–6.5)
NEUT%: 71.5 % (ref 40.0–80.0)
RBC: 3.27 10*6/uL — AB (ref 4.20–5.70)
RDW: 13.1 % (ref 11.1–15.7)
WBC: 5.3 10*3/uL (ref 4.0–10.0)

## 2014-02-25 LAB — FERRITIN CHCC: Ferritin: 55 ng/ml (ref 22–316)

## 2014-02-25 LAB — CHCC SATELLITE - SMEAR

## 2014-02-25 NOTE — Progress Notes (Signed)
Hematology and Oncology Follow Up Visit  Gary Ingram [id number] [birth year] 80 y.o. 02/25/2014   Principle Diagnosis:   GI bleeding secondary to Helicobacter gastritis and varices Chronic thrombocytopenia Portal hypertension  Current Therapy:    IV iron for anemia secondary to GI blood loss     Interim History:  Mr.  Gary Ingram is back for followup. Do further well. We last saw him come through some problems with GI bleeding. He does have some varices. He's on a have a Helicobacter infection. He received IV iron back in June.  He feels well. There is no nausea or vomiting. He's had no bleeding. There's been no change in bowel or bladder habits. He has had no issues with his diabetes.  There has been no cough. He has had no leg swelling. There have been no rashes.  Medications: Current outpatient prescriptions:diclofenac (VOLTAREN) 75 MG EC tablet, Take 75 mg by mouth 2 (two) times daily., Disp: , Rfl: ;  losartan-hydrochlorothiazide (HYZAAR) 50-12.5 MG per tablet, Take 1 tablet by mouth daily. , Disp: , Rfl: ;  meclizine (ANTIVERT) 25 MG tablet, Take 25 mg by mouth 3 (three) times daily as needed., Disp: , Rfl: ;  metFORMIN (GLUCOPHAGE) 500 MG tablet, Take 500 mg by mouth 2 (two) times daily with a meal., Disp: , Rfl:  metoprolol (LOPRESSOR) 50 MG tablet, 50 mg 2 (two) times daily. , Disp: , Rfl: ;  Multiple Vitamins-Minerals (CENTRUM ADULTS) TABS, Take by mouth every morning., Disp: , Rfl: ;  niacin-simvastatin (SIMCOR) 1000-20 MG 24 hr tablet, Take 1 tablet by mouth at bedtime., Disp: , Rfl: ;  ONE TOUCH ULTRA TEST test strip, every morning., Disp: , Rfl: ;  Polysaccharide Iron Complex (NIFEREX PO), Take by mouth every morning. 50-12.5 mg, Disp: , Rfl:  traMADol (ULTRAM) 50 MG tablet, Take 1 tablet (50 mg total) by mouth every 6 (six) hours as needed., Disp: 60 tablet, Rfl: 2  Allergies: No Known Allergies  Past Medical History, Surgical history, Social history, and Family History were  reviewed and updated.  Review of Systems: As above  Physical Exam:  height is 5\' 9"  (1.753 m) and weight is 238 lb (107.956 kg). His oral temperature is 97.6 F (36.4 C). His blood pressure is 126/61 and his pulse is 60. His respiration is 18.   Totally done. Head and neck exam shows no ocular or oral lesions. There are no palpable cervical or supraclavicular lymph nodes. Lungs are clear bilaterally. Cardiac exam regular in rhythm with no murmurs rubs or bruits. Abdomen is soft. Has good bowel sounds. There is no fluid wave. There is no palpable liver or spleen tip. Back exam no tenderness over the spine ribs or hips. Extremities shows no clubbing cyanosis or edema. Skin exam no rashes.  Lab Results  Component Value Date   WBC 5.3 02/25/2014   HGB 11.5* 02/25/2014   HCT 33.0* 02/25/2014   MCV 101* 02/25/2014   PLT 62 Platelet count consistent in citrate* 02/25/2014     Chemistry      Component Value Date/Time   NA 139 10/22/2013 0757   NA 139 06/04/2013 0810   K 4.4 10/22/2013 0757   K 4.3 06/04/2013 0810   CL 104 10/22/2013 0757   CL 106 06/04/2013 0810   CO2 23 10/22/2013 0757   CO2 26 06/04/2013 0810   BUN 23 10/22/2013 0757   BUN 26* 06/04/2013 0810   CREATININE 1.23 10/22/2013 0757   CREATININE 1.1 06/04/2013 0810  Component Value Date/Time   CALCIUM 9.5 10/22/2013 0757   CALCIUM 9.4 06/04/2013 0810   ALKPHOS 45 10/22/2013 0757   ALKPHOS 53 06/04/2013 0810   AST 24 10/22/2013 0757   AST 23 06/04/2013 0810   ALT 14 10/22/2013 0757   ALT 16 06/04/2013 0810   BILITOT 0.7 10/22/2013 0757   BILITOT 0.70 06/04/2013 0810      2  Impression and Plan: Mr. Gary Ingram is 80 year old gentleman. His chronic thrombocytopenia. He does have some portal hypertension. I suspect that there probably is some degree of cirrhosis and functional splenomegaly.  For now, he is asymptomatic. He is not bleeding. His hemoglobin is stable.  We will plan to get him back to see [place] in about 6 months  now.   Volanda Napoleon, MD 10/30/201510:32 AM

## 2014-04-11 ENCOUNTER — Other Ambulatory Visit: Payer: Self-pay | Admitting: Hematology & Oncology

## 2014-08-04 ENCOUNTER — Telehealth: Payer: Self-pay | Admitting: Hematology & Oncology

## 2014-08-04 NOTE — Telephone Encounter (Signed)
Pt moved 4-29 to 5-13

## 2014-08-26 ENCOUNTER — Ambulatory Visit: Payer: Medicare Other | Admitting: Hematology & Oncology

## 2014-08-26 ENCOUNTER — Other Ambulatory Visit: Payer: Medicare Other

## 2014-09-09 ENCOUNTER — Ambulatory Visit (HOSPITAL_BASED_OUTPATIENT_CLINIC_OR_DEPARTMENT_OTHER): Payer: Medicare Other | Admitting: Hematology & Oncology

## 2014-09-09 ENCOUNTER — Other Ambulatory Visit (HOSPITAL_BASED_OUTPATIENT_CLINIC_OR_DEPARTMENT_OTHER): Payer: Medicare Other

## 2014-09-09 ENCOUNTER — Encounter: Payer: Self-pay | Admitting: Hematology & Oncology

## 2014-09-09 VITALS — BP 146/60 | HR 69 | Temp 97.4°F | Resp 20 | Ht 69.0 in | Wt 239.0 lb

## 2014-09-09 DIAGNOSIS — D696 Thrombocytopenia, unspecified: Secondary | ICD-10-CM

## 2014-09-09 DIAGNOSIS — K922 Gastrointestinal hemorrhage, unspecified: Secondary | ICD-10-CM

## 2014-09-09 DIAGNOSIS — D500 Iron deficiency anemia secondary to blood loss (chronic): Secondary | ICD-10-CM

## 2014-09-09 DIAGNOSIS — K766 Portal hypertension: Secondary | ICD-10-CM

## 2014-09-09 DIAGNOSIS — I8500 Esophageal varices without bleeding: Secondary | ICD-10-CM

## 2014-09-09 LAB — CBC WITH DIFFERENTIAL (CANCER CENTER ONLY)
BASO#: 0 10*3/uL (ref 0.0–0.2)
BASO%: 0.4 % (ref 0.0–2.0)
EOS%: 2.2 % (ref 0.0–7.0)
Eosinophils Absolute: 0.1 10*3/uL (ref 0.0–0.5)
HCT: 33 % — ABNORMAL LOW (ref 38.7–49.9)
HGB: 11.4 g/dL — ABNORMAL LOW (ref 13.0–17.1)
LYMPH#: 0.7 10*3/uL — ABNORMAL LOW (ref 0.9–3.3)
LYMPH%: 15.3 % (ref 14.0–48.0)
MCH: 34.3 pg — ABNORMAL HIGH (ref 28.0–33.4)
MCHC: 34.5 g/dL (ref 32.0–35.9)
MCV: 99 fL — ABNORMAL HIGH (ref 82–98)
MONO#: 0.5 10*3/uL (ref 0.1–0.9)
MONO%: 10.3 % (ref 0.0–13.0)
NEUT#: 3.3 10*3/uL (ref 1.5–6.5)
NEUT%: 71.8 % (ref 40.0–80.0)
Platelets: 58 10*3/uL — ABNORMAL LOW (ref 145–400)
RBC: 3.32 10*6/uL — AB (ref 4.20–5.70)
RDW: 13 % (ref 11.1–15.7)
WBC: 4.6 10*3/uL (ref 4.0–10.0)

## 2014-09-09 LAB — IRON AND TIBC CHCC
%SAT: 25 % (ref 20–55)
Iron: 101 ug/dL (ref 42–163)
TIBC: 401 ug/dL (ref 202–409)
UIBC: 300 ug/dL (ref 117–376)

## 2014-09-09 LAB — RETICULOCYTES (CHCC)
ABS Retic: 65.2 10*3/uL (ref 19.0–186.0)
RBC.: 3.43 MIL/uL — ABNORMAL LOW (ref 4.22–5.81)
Retic Ct Pct: 1.9 % (ref 0.4–2.3)

## 2014-09-09 LAB — FERRITIN CHCC: FERRITIN: 18 ng/mL — AB (ref 22–316)

## 2014-09-09 LAB — CHCC SATELLITE - SMEAR

## 2014-09-09 MED ORDER — TRAMADOL HCL 50 MG PO TABS: 50.0000 mg | ORAL_TABLET | Freq: Four times a day (QID) | ORAL | Status: DC | PRN

## 2014-09-09 NOTE — Addendum Note (Signed)
Addended by: Burney Gauze R on: 09/09/2014 09:47 AM   Modules accepted: Orders

## 2014-09-09 NOTE — Progress Notes (Signed)
Hematology and Oncology Follow Up Visit  Gary Ingram [id number] [birth year] 81 y.o. 09/09/2014   Principle Diagnosis:   GI bleeding secondary to Helicobacter gastritis and varices Chronic thrombocytopenia Portal hypertension  Current Therapy:    IV iron for anemia secondary to GI blood loss     Interim History:  Gary Ingram is back for followup. We last saw him in October. Since then, he's been doing okay. He is getting a little more feeble.  His 82th birthday is next week.  There's been no change in his medications. We did give him some Ultram we saw him last because his legs. This seemed to help a little bit.  He's had no bleeding. He's had no change in bowel or bladder habits.  He has had no rashes. He has had no leg swelling.  Overall, his performance status is ECOG 3.  Medications:  Current outpatient prescriptions:  .  diclofenac (VOLTAREN) 75 MG EC tablet, Take 75 mg by mouth 2 (two) times daily., Disp: , Rfl:  .  meclizine (ANTIVERT) 25 MG tablet, Take 25 mg by mouth 3 (three) times daily as needed., Disp: , Rfl:  .  metFORMIN (GLUCOPHAGE) 500 MG tablet, Take 500 mg by mouth 2 (two) times daily with a meal., Disp: , Rfl:  .  metoprolol (LOPRESSOR) 50 MG tablet, 50 mg 2 (two) times daily. , Disp: , Rfl:  .  Multiple Vitamins-Minerals (CENTRUM ADULTS) TABS, Take by mouth every morning., Disp: , Rfl:  .  ONE TOUCH ULTRA TEST test strip, every morning., Disp: , Rfl:  .  POLY-IRON 150 150 MG capsule, Take 150 mg by mouth daily. , Disp: , Rfl: 11 .  Polysaccharide Iron Complex (NIFEREX PO), Take by mouth every morning. 50-12.5 mg, Disp: , Rfl:  .  simvastatin (ZOCOR) 20 MG tablet, Take 20 mg by mouth daily at 6 PM. , Disp: , Rfl: 11 .  losartan-hydrochlorothiazide (HYZAAR) 50-12.5 MG per tablet, Take 1 tablet by mouth daily. , Disp: , Rfl:   Allergies: No Known Allergies  Past Medical History, Surgical history, Social history, and Family History were reviewed and  updated.  Review of Systems: As above  Physical Exam:  height is 5\' 9"  (1.753 m) and weight is 239 lb (108.41 kg). His oral temperature is 97.4 F (36.3 C). His blood pressure is 146/60 and his pulse is 69. His respiration is 20.   Elderly white gentleman in no obvious distress.. Head and neck exam shows no ocular or oral lesions. There are no palpable cervical or supraclavicular lymph nodes. Lungs are clear bilaterally. Cardiac exam regular rate and rhythm with no murmurs rubs or bruits. Abdomen is soft. Has good bowel sounds. There is no fluid wave. There is no palpable liver or spleen tip. Back exam no tenderness over the spine ribs or hips. Extremities shows no clubbing cyanosis or edema. Skin exam no rashes.  Lab Results  Component Value Date   WBC 4.6 09/09/2014   HGB 11.4* 09/09/2014   HCT 33.0* 09/09/2014   MCV 99* 09/09/2014   PLT 58 Platelet count consistent in citrate* 09/09/2014     Chemistry      Component Value Date/Time   NA 139 10/22/2013 0757   NA 139 06/04/2013 0810   K 4.4 10/22/2013 0757   K 4.3 06/04/2013 0810   CL 104 10/22/2013 0757   CL 106 06/04/2013 0810   CO2 23 10/22/2013 0757   CO2 26 06/04/2013 0810   BUN 23  10/22/2013 0757   BUN 26* 06/04/2013 0810   CREATININE 1.23 10/22/2013 0757   CREATININE 1.1 06/04/2013 0810      Component Value Date/Time   CALCIUM 9.5 10/22/2013 0757   CALCIUM 9.4 06/04/2013 0810   ALKPHOS 45 10/22/2013 0757   ALKPHOS 53 06/04/2013 0810   AST 24 10/22/2013 0757   AST 23 06/04/2013 0810   ALT 14 10/22/2013 0757   ALT 16 06/04/2013 0810   BILITOT 0.7 10/22/2013 0757   BILITOT 0.70 06/04/2013 0810      2  Impression and Plan: Gary Ingram is 81 year old gentleman. He has chronic thrombocytopenia. He does have some portal hypertension. I suspect that there probably is some degree of cirrhosis and functional splenomegaly.  For now, he is asymptomatic. He is not bleeding. His hemoglobin is stable.  We will plan to  get him back to see [place] in about 6 months now.   Volanda Napoleon, MD 5/13/20169:42 AM

## 2015-03-10 ENCOUNTER — Other Ambulatory Visit (HOSPITAL_BASED_OUTPATIENT_CLINIC_OR_DEPARTMENT_OTHER): Payer: Medicare Other

## 2015-03-10 ENCOUNTER — Encounter: Payer: Self-pay | Admitting: Family

## 2015-03-10 ENCOUNTER — Ambulatory Visit (HOSPITAL_BASED_OUTPATIENT_CLINIC_OR_DEPARTMENT_OTHER): Payer: Medicare Other | Admitting: Family

## 2015-03-10 VITALS — BP 141/56 | HR 63 | Temp 97.2°F | Wt 235.0 lb

## 2015-03-10 DIAGNOSIS — D509 Iron deficiency anemia, unspecified: Secondary | ICD-10-CM | POA: Diagnosis not present

## 2015-03-10 DIAGNOSIS — D696 Thrombocytopenia, unspecified: Secondary | ICD-10-CM

## 2015-03-10 DIAGNOSIS — D500 Iron deficiency anemia secondary to blood loss (chronic): Secondary | ICD-10-CM

## 2015-03-10 LAB — COMPREHENSIVE METABOLIC PANEL (CC13)
ALT: 20 U/L (ref 0–55)
AST: 26 U/L (ref 5–34)
Albumin: 3.8 g/dL (ref 3.5–5.0)
Alkaline Phosphatase: 75 U/L (ref 40–150)
Anion Gap: 9 mEq/L (ref 3–11)
BUN: 20.5 mg/dL (ref 7.0–26.0)
CALCIUM: 9.3 mg/dL (ref 8.4–10.4)
CHLORIDE: 109 meq/L (ref 98–109)
CO2: 24 mEq/L (ref 22–29)
CREATININE: 1.2 mg/dL (ref 0.7–1.3)
EGFR: 55 mL/min/{1.73_m2} — ABNORMAL LOW (ref >90)
GLUCOSE: 136 mg/dL (ref 70–140)
Potassium: 4.4 mEq/L (ref 3.5–5.1)
Sodium: 142 mEq/L (ref 136–145)
Total Bilirubin: 0.56 mg/dL (ref 0.20–1.20)
Total Protein: 6.9 g/dL (ref 6.4–8.3)

## 2015-03-10 LAB — FERRITIN CHCC: Ferritin: 29 ng/ml (ref 22–316)

## 2015-03-10 LAB — CBC WITH DIFFERENTIAL (CANCER CENTER ONLY)
BASO#: 0 10*3/uL (ref 0.0–0.2)
BASO%: 0.6 % (ref 0.0–2.0)
EOS ABS: 0.1 10*3/uL (ref 0.0–0.5)
EOS%: 3 % (ref 0.0–7.0)
HCT: 35.1 % — ABNORMAL LOW (ref 38.7–49.9)
HEMOGLOBIN: 11.5 g/dL — AB (ref 13.0–17.1)
LYMPH#: 0.5 10*3/uL — ABNORMAL LOW (ref 0.9–3.3)
LYMPH%: 11 % — AB (ref 14.0–48.0)
MCH: 32.2 pg (ref 28.0–33.4)
MCHC: 32.8 g/dL (ref 32.0–35.9)
MCV: 98 fL (ref 82–98)
MONO#: 0.4 10*3/uL (ref 0.1–0.9)
MONO%: 8.2 % (ref 0.0–13.0)
NEUT#: 3.7 10*3/uL (ref 1.5–6.5)
NEUT%: 77.2 % (ref 40.0–80.0)
RBC: 3.57 10*6/uL — ABNORMAL LOW (ref 4.20–5.70)
RDW: 13.7 % (ref 11.1–15.7)
WBC: 4.7 10*3/uL (ref 4.0–10.0)

## 2015-03-10 LAB — IRON AND TIBC CHCC
%SAT: 20 % (ref 20–55)
Iron: 73 ug/dL (ref 42–163)
TIBC: 356 ug/dL (ref 202–409)
UIBC: 283 ug/dL (ref 117–376)

## 2015-03-10 LAB — CHCC SATELLITE - SMEAR

## 2015-03-10 NOTE — Progress Notes (Signed)
Hematology and Oncology Follow Up Visit  Gary Ingram [id number] 01-05-1933 81 y.o. 03/10/2015   Principle Diagnosis:  GI bleeding secondary to Helicobacter gastritis and varices Chronic thrombocytopenia Portal hypertension  Current Therapy:   IV iron as indicated     Interim History:  Gary Ingram is here today for a follow-up. He is doing well and has no complaints at this time.  He has not had any episodes of blood in his stool. His Hgb is holding nicely at 11.5 with an MCV of 98. He last had iron in July 2015. He has had no fever, chills, n/v, cough, rash, dizziness, SOB, chest pain, palpitations, abdominal pain or changes in bowel or bladder habits.  No lymphadenopathy found on exam.  He has neuropathy in his feet that is unchanged. He uses a cane to ambulate and has had no falls.  He is diabetic and takes Metformin daily. He states that his blood sugars are controlled.  No swelling or tenderness in his extremities.  He has a good appetite and is staying well hydrated. His weight is stable.   Medications:    Medication List       This list is accurate as of: 03/10/15  8:48 AM.  Always use your most recent med list.               CENTRUM ADULTS Tabs  Take by mouth every morning.     diclofenac 75 MG EC tablet  Commonly known as:  VOLTAREN  Take 75 mg by mouth 2 (two) times daily.     losartan-hydrochlorothiazide 50-12.5 MG tablet  Commonly known as:  HYZAAR  Take 1 tablet by mouth daily.     meclizine 25 MG tablet  Commonly known as:  ANTIVERT  Take 25 mg by mouth 3 (three) times daily as needed.     metFORMIN 500 MG tablet  Commonly known as:  GLUCOPHAGE  Take 500 mg by mouth 2 (two) times daily with a meal.     metoprolol 50 MG tablet  Commonly known as:  LOPRESSOR  50 mg 2 (two) times daily.     NIFEREX PO  Take by mouth every morning. 50-12.5 mg     ONE TOUCH ULTRA TEST test strip  Generic drug:  glucose blood  every morning.     POLY-IRON  150 150 MG capsule  Generic drug:  iron polysaccharides  Take 150 mg by mouth daily.     simvastatin 20 MG tablet  Commonly known as:  ZOCOR  Take 20 mg by mouth daily at 6 PM.     traMADol 50 MG tablet  Commonly known as:  ULTRAM  Take 1 tablet (50 mg total) by mouth every 6 (six) hours as needed.        Allergies: No Known Allergies  Past Medical History, Surgical history, Social history, and Family History were reviewed and updated.  Review of Systems: All other 10 point review of systems is negative.   Physical Exam:  weight is 235 lb (106.595 kg). His oral temperature is 97.2 F (36.2 C). His blood pressure is 141/56 and his pulse is 63.   Wt Readings from Last 3 Encounters:  03/10/15 235 lb (106.595 kg)  09/09/14 239 lb (108.41 kg)  02/25/14 238 lb (107.956 kg)    Ocular: Sclerae unicteric, pupils equal, round and reactive to light Ear-nose-throat: Oropharynx clear, dentition fair Lymphatic: No cervical or supraclavicular adenopathy Lungs no rales or rhonchi, good excursion bilaterally Heart regular rate  and rhythm, no murmur appreciated Abd soft, nontender, positive bowel sounds, unable to palpate liver or spleen MSK no focal spinal tenderness, no joint edema Neuro: non-focal, well-oriented, appropriate affect Breasts: Deferred  Lab Results  Component Value Date   WBC 4.7 03/10/2015   HGB 11.5* 03/10/2015   HCT 35.1* 03/10/2015   MCV 98 03/10/2015   PLT 68 Platelet count consistent in citrate* 03/10/2015   Lab Results  Component Value Date   FERRITIN 18* 09/09/2014   IRON 101 09/09/2014   TIBC 401 09/09/2014   UIBC 300 09/09/2014   IRONPCTSAT 25 09/09/2014   Lab Results  Component Value Date   RETICCTPCT 1.9 09/09/2014   RBC 3.57* 03/10/2015   RETICCTABS 65.2 09/09/2014   No results found for: KPAFRELGTCHN, LAMBDASER, KAPLAMBRATIO No results found for: Kandis Cocking [facility] Lab Results  Component Value Date   TOTALPROTELP 7.3 09/21/2008    ALBUMINELP 56.2 09/21/2008   A1GS 4.1 09/21/2008   A2GS 12.2* 09/21/2008   BETS 6.5 09/21/2008   BETA2SER 3.7 09/21/2008   GAMS 17.3 09/21/2008   MSPIKE NOT DET 09/21/2008   SPEI * 09/21/2008     Chemistry      Component Value Date/Time   NA 139 10/22/2013 0757   NA 139 06/04/2013 0810   K 4.4 10/22/2013 0757   K 4.3 06/04/2013 0810   CL 104 10/22/2013 0757   CL 106 06/04/2013 0810   CO2 23 10/22/2013 0757   CO2 26 06/04/2013 0810   BUN 23 10/22/2013 0757   BUN 26* 06/04/2013 0810   CREATININE 1.23 10/22/2013 0757   CREATININE 1.1 06/04/2013 0810      Component Value Date/Time   CALCIUM 9.5 10/22/2013 0757   CALCIUM 9.4 06/04/2013 0810   ALKPHOS 45 10/22/2013 0757   ALKPHOS 53 06/04/2013 0810   AST 24 10/22/2013 0757   AST 23 06/04/2013 0810   ALT 14 10/22/2013 0757   ALT 16 06/04/2013 0810   BILITOT 0.7 10/22/2013 0757   BILITOT 0.70 06/04/2013 0810     Impression and Plan: Gary Ingram is an 81 yo white male with iron deficiency anemia and chronic thrombocytopenia. So far her has done well and is asymptomatic at this time.  His last dose of Feraheme was in July 2015 and he responded nicely. He has had no more episodes of bleeding.  We will see what his iron studies show.  We will plan to see him back in 6 months for labs and follow-up.  He will contact us with any questions or concerns. We can certainly see him sooner if need be.   Eliezer Bottom, NP 11/11/20168:48 AM

## 2015-07-17 ENCOUNTER — Encounter: Payer: Self-pay | Admitting: Internal Medicine

## 2015-09-08 ENCOUNTER — Ambulatory Visit: Payer: Medicare Other

## 2015-09-08 ENCOUNTER — Other Ambulatory Visit: Payer: Medicare Other

## 2015-09-08 ENCOUNTER — Ambulatory Visit: Payer: Medicare Other | Admitting: Hematology & Oncology

## 2015-09-14 ENCOUNTER — Other Ambulatory Visit: Payer: Self-pay | Admitting: Family

## 2015-09-14 DIAGNOSIS — D509 Iron deficiency anemia, unspecified: Secondary | ICD-10-CM | POA: Insufficient documentation

## 2015-09-15 ENCOUNTER — Ambulatory Visit (HOSPITAL_BASED_OUTPATIENT_CLINIC_OR_DEPARTMENT_OTHER): Payer: Medicare Other | Admitting: Family

## 2015-09-15 ENCOUNTER — Encounter: Payer: Self-pay | Admitting: Family

## 2015-09-15 ENCOUNTER — Ambulatory Visit: Payer: Medicare Other

## 2015-09-15 ENCOUNTER — Other Ambulatory Visit (HOSPITAL_BASED_OUTPATIENT_CLINIC_OR_DEPARTMENT_OTHER): Payer: Medicare Other

## 2015-09-15 VITALS — BP 143/46 | HR 64 | Temp 97.5°F | Resp 16 | Ht 69.0 in | Wt 223.0 lb

## 2015-09-15 DIAGNOSIS — D509 Iron deficiency anemia, unspecified: Secondary | ICD-10-CM | POA: Diagnosis not present

## 2015-09-15 DIAGNOSIS — K5520 Angiodysplasia of colon without hemorrhage: Secondary | ICD-10-CM

## 2015-09-15 DIAGNOSIS — E119 Type 2 diabetes mellitus without complications: Secondary | ICD-10-CM

## 2015-09-15 DIAGNOSIS — Q2733 Arteriovenous malformation of digestive system vessel: Secondary | ICD-10-CM

## 2015-09-15 DIAGNOSIS — D696 Thrombocytopenia, unspecified: Secondary | ICD-10-CM

## 2015-09-15 DIAGNOSIS — G629 Polyneuropathy, unspecified: Secondary | ICD-10-CM | POA: Diagnosis not present

## 2015-09-15 DIAGNOSIS — D500 Iron deficiency anemia secondary to blood loss (chronic): Secondary | ICD-10-CM

## 2015-09-15 LAB — CHCC SATELLITE - SMEAR

## 2015-09-15 LAB — CBC WITH DIFFERENTIAL (CANCER CENTER ONLY)
BASO#: 0 10*3/uL (ref 0.0–0.2)
BASO%: 0.2 % (ref 0.0–2.0)
EOS%: 2.8 % (ref 0.0–7.0)
Eosinophils Absolute: 0.1 10*3/uL (ref 0.0–0.5)
HCT: 35.1 % — ABNORMAL LOW (ref 38.7–49.9)
HGB: 12.2 g/dL — ABNORMAL LOW (ref 13.0–17.1)
LYMPH#: 0.6 10*3/uL — ABNORMAL LOW (ref 0.9–3.3)
LYMPH%: 13.8 % — AB (ref 14.0–48.0)
MCH: 34.1 pg — ABNORMAL HIGH (ref 28.0–33.4)
MCHC: 34.8 g/dL (ref 32.0–35.9)
MCV: 98 fL (ref 82–98)
MONO#: 0.4 10*3/uL (ref 0.1–0.9)
MONO%: 8.4 % (ref 0.0–13.0)
NEUT#: 3.2 10*3/uL (ref 1.5–6.5)
NEUT%: 74.8 % (ref 40.0–80.0)
RBC: 3.58 10*6/uL — AB (ref 4.20–5.70)
RDW: 13.7 % (ref 11.1–15.7)
WBC: 4.3 10*3/uL (ref 4.0–10.0)

## 2015-09-15 LAB — COMPREHENSIVE METABOLIC PANEL
ALT: 17 U/L (ref 0–55)
AST: 34 U/L (ref 5–34)
Albumin: 4.2 g/dL (ref 3.5–5.0)
Alkaline Phosphatase: 37 U/L — ABNORMAL LOW (ref 40–150)
Anion Gap: 8 mEq/L (ref 3–11)
BUN: 34.8 mg/dL — AB (ref 7.0–26.0)
CHLORIDE: 110 meq/L — AB (ref 98–109)
CO2: 22 meq/L (ref 22–29)
CREATININE: 1.9 mg/dL — AB (ref 0.7–1.3)
Calcium: 9.7 mg/dL (ref 8.4–10.4)
EGFR: 33 mL/min/{1.73_m2} — ABNORMAL LOW (ref >90)
Glucose: 114 mg/dl (ref 70–140)
Potassium: 4.5 mEq/L (ref 3.5–5.1)
Sodium: 140 mEq/L (ref 136–145)
Total Bilirubin: 0.6 mg/dL (ref 0.20–1.20)
Total Protein: 7.4 g/dL (ref 6.4–8.3)

## 2015-09-15 NOTE — Progress Notes (Signed)
Hematology and Oncology Follow Up Visit  Gary Ingram [id number] 06/04/1932 82 y.o. 09/15/2015   Principle Diagnosis:  GI bleeding secondary to Helicobacter gastritis and varices Chronic thrombocytopenia Portal hypertension  Current Therapy:   IV iron as indicated     Interim History:  Gary Ingram is here today with his son for a follow-up. He continues to well and has no complaints at this time. His Hgb is stable at 12.2 with an MCV of 98. He has not required and iron infusion in over a year.  He denies having any episodes of bleeding or bruising. No blood noticed in his stool.  No fever, chills, n/v, cough, rash, dizziness, SOB, chest pain, palpitations, abdominal pain or changes in bowel or bladder habits.  No lymphadenopathy found on exam.  He has neuropathy in his feet that is unchanged. He uses a cane to ambulate and has had no falls or syncopal episodes. No swelling or tenderness in his extremities.  He has a good appetite and is staying well hydrated. He is diabetic and takes Metformin daily. He states that his blood sugars are controlled.  Medications:    Medication List       This list is accurate as of: 09/15/15  9:46 AM.  Always use your most recent med list.               CENTRUM ADULTS Tabs  Take by mouth every morning.     diclofenac 75 MG EC tablet  Commonly known as:  VOLTAREN  Take 75 mg by mouth 2 (two) times daily.     losartan-hydrochlorothiazide 50-12.5 MG tablet  Commonly known as:  HYZAAR  Take 1 tablet by mouth daily.     meclizine 25 MG tablet  Commonly known as:  ANTIVERT  Take 25 mg by mouth 3 (three) times daily as needed.     metFORMIN 500 MG tablet  Commonly known as:  GLUCOPHAGE  Take 500 mg by mouth 2 (two) times daily with a meal.     metoprolol 50 MG tablet  Commonly known as:  LOPRESSOR  50 mg 2 (two) times daily.     NIFEREX PO  Take by mouth every morning. 50-12.5 mg     ONE TOUCH ULTRA TEST test strip  Generic drug:   glucose blood  every morning.     POLY-IRON 150 150 MG capsule  Generic drug:  iron polysaccharides  Take 150 mg by mouth daily.     simvastatin 20 MG tablet  Commonly known as:  ZOCOR  Take 20 mg by mouth daily at 6 PM.     traMADol 50 MG tablet  Commonly known as:  ULTRAM  Take 1 tablet (50 mg total) by mouth every 6 (six) hours as needed.        Allergies: No Known Allergies  Past Medical History, Surgical history, Social history, and Family History were reviewed and updated.  Review of Systems: All other 10 point review of systems is negative.   Physical Exam:  height is 5\' 9"  (1.753 m) and weight is 223 lb (101.152 kg). His oral temperature is 97.5 F (36.4 C). His blood pressure is 143/46 and his pulse is 64. His respiration is 16.   Wt Readings from Last 3 Encounters:  09/15/15 223 lb (101.152 kg)  03/10/15 235 lb (106.595 kg)  09/09/14 239 lb (108.41 kg)    Ocular: Sclerae unicteric, pupils equal, round and reactive to light Ear-nose-throat: Oropharynx clear, dentition fair Lymphatic: No  cervical supraclavicular or axillary adenopathy Lungs no rales or rhonchi, good excursion bilaterally Heart regular rate and rhythm, no murmur appreciated Abd soft, nontender, positive bowel sounds, unable to palpate liver or spleen or tip on exam, no fluid wave MSK no focal spinal tenderness, no joint edema Neuro: non-focal, well-oriented, appropriate affect Breasts: Deferred  Lab Results  Component Value Date   WBC 4.3 09/15/2015   HGB 12.2* 09/15/2015   HCT 35.1* 09/15/2015   MCV 98 09/15/2015   PLT 78 Platelet count consistent in citrate* 09/15/2015   Lab Results  Component Value Date   FERRITIN 29 03/10/2015   IRON 73 03/10/2015   TIBC 356 03/10/2015   UIBC 283 03/10/2015   IRONPCTSAT 20 03/10/2015   Lab Results  Component Value Date   RETICCTPCT 1.9 09/09/2014   RBC 3.58* 09/15/2015   RETICCTABS 65.2 09/09/2014   No results found for: KPAFRELGTCHN,  LAMBDASER, KAPLAMBRATIO No results found for: Kandis Cocking, [facility] Lab Results  Component Value Date   TOTALPROTELP 7.3 09/21/2008   ALBUMINELP 56.2 09/21/2008   A1GS 4.1 09/21/2008   A2GS 12.2* 09/21/2008   BETS 6.5 09/21/2008   BETA2SER 3.7 09/21/2008   GAMS 17.3 09/21/2008   MSPIKE NOT DET 09/21/2008   SPEI * 09/21/2008     Chemistry      Component Value Date/Time   NA 142 03/10/2015 0817   NA 139 10/22/2013 0757   NA 139 06/04/2013 0810   K 4.4 03/10/2015 0817   K 4.4 10/22/2013 0757   K 4.3 06/04/2013 0810   CL 104 10/22/2013 0757   CL 106 06/04/2013 0810   CO2 24 03/10/2015 0817   CO2 23 10/22/2013 0757   CO2 26 06/04/2013 0810   BUN 20.5 03/10/2015 0817   BUN 23 10/22/2013 0757   BUN 26* 06/04/2013 0810   CREATININE 1.2 03/10/2015 0817   CREATININE 1.23 10/22/2013 0757   CREATININE 1.1 06/04/2013 0810      Component Value Date/Time   CALCIUM 9.3 03/10/2015 0817   CALCIUM 9.5 10/22/2013 0757   CALCIUM 9.4 06/04/2013 0810   ALKPHOS 75 03/10/2015 0817   ALKPHOS 45 10/22/2013 0757   ALKPHOS 53 06/04/2013 0810   AST 26 03/10/2015 0817   AST 24 10/22/2013 0757   AST 23 06/04/2013 0810   ALT 20 03/10/2015 0817   ALT 14 10/22/2013 0757   ALT 16 06/04/2013 0810   BILITOT 0.56 03/10/2015 0817   BILITOT 0.7 10/22/2013 0757   BILITOT 0.70 06/04/2013 0810     Impression and Plan: Gary Ingram is an 82 yo white male with iron deficiency anemia and chronic thrombocytopenia. So far her has done well and is asymptomatic at this time. He has not required Feraheme in over a year. His Hgb remains stable at 12.2 and an MCV of 98. His platelet count is slightly improved at 78. No episodes of bleeding.  We will see what his iron studies show and bring him in next week for an infusion if need.  We will go ahead and plan to see him back in 6 months for labs and follow-up.  He will contact us with any questions or concerns. We can certainly see him sooner if need be.    Eliezer Bottom, NP 5/19/809:46 AM

## 2015-09-15 NOTE — Progress Notes (Signed)
No treatment today per Sarah [place] NP 

## 2015-09-16 LAB — IRON AND TIBC CHCC
IRON SATURATION: 19 % (ref 15–55)
Iron: 85 ug/dL (ref 38–169)
TIBC: 451 ug/dL — AB (ref 250–450)
UIBC: 366 ug/dL — ABNORMAL HIGH (ref 111–343)

## 2015-09-16 LAB — FERRITIN CHCC: Ferritin: 72 ng/mL (ref 30–400)

## 2016-03-29 ENCOUNTER — Ambulatory Visit: Payer: Medicare Other | Admitting: Hematology & Oncology

## 2016-03-29 ENCOUNTER — Other Ambulatory Visit: Payer: Medicare Other

## 2016-06-03 ENCOUNTER — Encounter (INDEPENDENT_AMBULATORY_CARE_PROVIDER_SITE_OTHER): Payer: Self-pay | Admitting: Ophthalmology

## 2016-06-14 ENCOUNTER — Encounter (INDEPENDENT_AMBULATORY_CARE_PROVIDER_SITE_OTHER): Payer: Self-pay | Admitting: Ophthalmology

## 2016-06-28 ENCOUNTER — Encounter (INDEPENDENT_AMBULATORY_CARE_PROVIDER_SITE_OTHER): Payer: Medicare Other | Admitting: Ophthalmology

## 2016-06-28 DIAGNOSIS — I10 Essential (primary) hypertension: Secondary | ICD-10-CM

## 2016-06-28 DIAGNOSIS — H353111 Nonexudative age-related macular degeneration, right eye, early dry stage: Secondary | ICD-10-CM

## 2016-06-28 DIAGNOSIS — H43811 Vitreous degeneration, right eye: Secondary | ICD-10-CM | POA: Diagnosis not present

## 2016-06-28 DIAGNOSIS — H2512 Age-related nuclear cataract, left eye: Secondary | ICD-10-CM | POA: Diagnosis not present

## 2016-06-28 DIAGNOSIS — H35031 Hypertensive retinopathy, right eye: Secondary | ICD-10-CM | POA: Diagnosis not present

## 2018-04-24 ENCOUNTER — Encounter: Payer: Self-pay | Admitting: Gastroenterology

## 2018-04-29 ENCOUNTER — Encounter (HOSPITAL_COMMUNITY): Payer: Self-pay | Admitting: Emergency Medicine

## 2018-04-29 ENCOUNTER — Inpatient Hospital Stay (HOSPITAL_COMMUNITY)
Admission: EM | Admit: 2018-04-29 | Discharge: 2018-05-03 | DRG: 432 | Disposition: A | Discharge status: Home / Self Care | Payer: Medicare Other | Attending: Internal Medicine | Admitting: Internal Medicine

## 2018-04-29 ENCOUNTER — Other Ambulatory Visit: Payer: Self-pay

## 2018-04-29 DIAGNOSIS — B353 Tinea pedis: Secondary | ICD-10-CM | POA: Diagnosis present

## 2018-04-29 DIAGNOSIS — E119 Type 2 diabetes mellitus without complications: Secondary | ICD-10-CM

## 2018-04-29 DIAGNOSIS — E875 Hyperkalemia: Secondary | ICD-10-CM | POA: Diagnosis present

## 2018-04-29 DIAGNOSIS — K7469 Other cirrhosis of liver: Principal | ICD-10-CM | POA: Diagnosis present

## 2018-04-29 DIAGNOSIS — L97519 Non-pressure chronic ulcer of other part of right foot with unspecified severity: Secondary | ICD-10-CM | POA: Diagnosis present

## 2018-04-29 DIAGNOSIS — K3189 Other diseases of stomach and duodenum: Secondary | ICD-10-CM | POA: Diagnosis present

## 2018-04-29 DIAGNOSIS — I8500 Esophageal varices without bleeding: Secondary | ICD-10-CM | POA: Diagnosis present

## 2018-04-29 DIAGNOSIS — K922 Gastrointestinal hemorrhage, unspecified: Secondary | ICD-10-CM | POA: Diagnosis present

## 2018-04-29 DIAGNOSIS — Z7984 Long term (current) use of oral hypoglycemic drugs: Secondary | ICD-10-CM | POA: Diagnosis not present

## 2018-04-29 DIAGNOSIS — E11621 Type 2 diabetes mellitus with foot ulcer: Secondary | ICD-10-CM | POA: Diagnosis present

## 2018-04-29 DIAGNOSIS — I959 Hypotension, unspecified: Secondary | ICD-10-CM | POA: Diagnosis present

## 2018-04-29 DIAGNOSIS — D649 Anemia, unspecified: Secondary | ICD-10-CM | POA: Diagnosis present

## 2018-04-29 DIAGNOSIS — E785 Hyperlipidemia, unspecified: Secondary | ICD-10-CM | POA: Diagnosis present

## 2018-04-29 DIAGNOSIS — K635 Polyp of colon: Secondary | ICD-10-CM | POA: Diagnosis present

## 2018-04-29 DIAGNOSIS — E1142 Type 2 diabetes mellitus with diabetic polyneuropathy: Secondary | ICD-10-CM | POA: Diagnosis present

## 2018-04-29 DIAGNOSIS — I8510 Secondary esophageal varices without bleeding: Secondary | ICD-10-CM | POA: Diagnosis not present

## 2018-04-29 DIAGNOSIS — N179 Acute kidney failure, unspecified: Secondary | ICD-10-CM | POA: Diagnosis present

## 2018-04-29 DIAGNOSIS — K5520 Angiodysplasia of colon without hemorrhage: Secondary | ICD-10-CM

## 2018-04-29 DIAGNOSIS — Z87891 Personal history of nicotine dependence: Secondary | ICD-10-CM

## 2018-04-29 DIAGNOSIS — K766 Portal hypertension: Secondary | ICD-10-CM | POA: Diagnosis present

## 2018-04-29 DIAGNOSIS — K7460 Unspecified cirrhosis of liver: Secondary | ICD-10-CM | POA: Diagnosis not present

## 2018-04-29 DIAGNOSIS — I8511 Secondary esophageal varices with bleeding: Secondary | ICD-10-CM | POA: Diagnosis not present

## 2018-04-29 DIAGNOSIS — D62 Acute posthemorrhagic anemia: Secondary | ICD-10-CM | POA: Diagnosis present

## 2018-04-29 DIAGNOSIS — Z8673 Personal history of transient ischemic attack (TIA), and cerebral infarction without residual deficits: Secondary | ICD-10-CM

## 2018-04-29 DIAGNOSIS — E1165 Type 2 diabetes mellitus with hyperglycemia: Secondary | ICD-10-CM | POA: Diagnosis present

## 2018-04-29 DIAGNOSIS — Q2733 Arteriovenous malformation of digestive system vessel: Secondary | ICD-10-CM | POA: Diagnosis not present

## 2018-04-29 DIAGNOSIS — Z794 Long term (current) use of insulin: Secondary | ICD-10-CM

## 2018-04-29 DIAGNOSIS — N184 Chronic kidney disease, stage 4 (severe): Secondary | ICD-10-CM | POA: Diagnosis present

## 2018-04-29 DIAGNOSIS — D509 Iron deficiency anemia, unspecified: Secondary | ICD-10-CM | POA: Diagnosis present

## 2018-04-29 DIAGNOSIS — Z79899 Other long term (current) drug therapy: Secondary | ICD-10-CM | POA: Diagnosis not present

## 2018-04-29 DIAGNOSIS — E1122 Type 2 diabetes mellitus with diabetic chronic kidney disease: Secondary | ICD-10-CM | POA: Diagnosis present

## 2018-04-29 LAB — COMPREHENSIVE METABOLIC PANEL WITH GFR
ALT: 19 U/L (ref 0–44)
AST: 56 U/L — ABNORMAL HIGH (ref 15–41)
Albumin: 2.4 g/dL — ABNORMAL LOW (ref 3.5–5.0)
Alkaline Phosphatase: 40 U/L (ref 38–126)
Anion gap: 6 (ref 5–15)
BUN: 53 mg/dL — ABNORMAL HIGH (ref 8–23)
CO2: 19 mmol/L — ABNORMAL LOW (ref 22–32)
Calcium: 8.3 mg/dL — ABNORMAL LOW (ref 8.9–10.3)
Chloride: 113 mmol/L — ABNORMAL HIGH (ref 98–111)
Creatinine, Ser: 2.3 mg/dL — ABNORMAL HIGH (ref 0.61–1.24)
GFR calc Af Amer: 29 mL/min — ABNORMAL LOW
GFR calc non Af Amer: 25 mL/min — ABNORMAL LOW
Glucose, Bld: 117 mg/dL — ABNORMAL HIGH (ref 70–99)
Potassium: 5.9 mmol/L — ABNORMAL HIGH (ref 3.5–5.1)
Sodium: 138 mmol/L (ref 135–145)
Total Bilirubin: 0.3 mg/dL (ref 0.3–1.2)
Total Protein: 5.1 g/dL — ABNORMAL LOW (ref 6.5–8.1)

## 2018-04-29 LAB — FERRITIN: Ferritin: 15 ng/mL — ABNORMAL LOW (ref 24–336)

## 2018-04-29 LAB — RETICULOCYTES
Immature Retic Fract: 31.8 % — ABNORMAL HIGH (ref 2.3–15.9)
RBC.: 1.84 MIL/uL — ABNORMAL LOW (ref 4.22–5.81)
Retic Count, Absolute: 53.4 10*3/uL (ref 19.0–186.0)
Retic Ct Pct: 2.9 % (ref 0.4–3.1)

## 2018-04-29 LAB — IRON AND TIBC
Iron: 20 ug/dL — ABNORMAL LOW (ref 45–182)
Saturation Ratios: 5 % — ABNORMAL LOW (ref 17.9–39.5)
TIBC: 409 ug/dL (ref 250–450)
UIBC: 389 ug/dL

## 2018-04-29 LAB — CBC
HEMATOCRIT: 19.7 % — AB (ref 39.0–52.0)
Hemoglobin: 5.8 g/dL — CL (ref 13.0–17.0)
MCH: 30.4 pg (ref 26.0–34.0)
MCHC: 29.4 g/dL — ABNORMAL LOW (ref 30.0–36.0)
MCV: 103.1 fL — ABNORMAL HIGH (ref 80.0–100.0)
Platelets: 229 10*3/uL (ref 150–400)
RBC: 1.91 MIL/uL — ABNORMAL LOW (ref 4.22–5.81)
RDW: 15.9 % — AB (ref 11.5–15.5)
WBC: 8.6 10*3/uL (ref 4.0–10.5)
nRBC: 0 % (ref 0.0–0.2)

## 2018-04-29 LAB — POC OCCULT BLOOD, ED: Fecal Occult Bld: POSITIVE — AB

## 2018-04-29 LAB — PROTIME-INR
INR: 1.7
Prothrombin Time: 19.7 s — ABNORMAL HIGH (ref 11.4–15.2)

## 2018-04-29 LAB — PREPARE RBC (CROSSMATCH)

## 2018-04-29 LAB — VITAMIN B12: Vitamin B-12: 370 pg/mL (ref 180–914)

## 2018-04-29 LAB — FOLATE: Folate: 13.6 ng/mL

## 2018-04-29 MED ORDER — SODIUM CHLORIDE 0.9 % IV SOLN
50.0000 ug/h | INTRAVENOUS | Status: DC
  Administered 2018-04-29 – 2018-05-02 (×7): 50 ug/h via INTRAVENOUS
  Filled 2018-04-29 (×11): qty 1

## 2018-04-29 MED ORDER — FLUCONAZOLE 100MG IVPB
100.0000 mg | INTRAVENOUS | Status: DC
  Administered 2018-04-30 (×2): 100 mg via INTRAVENOUS
  Filled 2018-04-29 (×2): qty 50

## 2018-04-29 MED ORDER — OCTREOTIDE LOAD VIA INFUSION
50.0000 ug | Freq: Once | INTRAVENOUS | Status: AC
  Administered 2018-04-29: 50 ug via INTRAVENOUS
  Filled 2018-04-29: qty 25

## 2018-04-29 MED ORDER — SODIUM CHLORIDE 0.9 % IV SOLN
80.0000 mg | Freq: Once | INTRAVENOUS | Status: DC
  Filled 2018-04-29: qty 80

## 2018-04-29 MED ORDER — SODIUM CHLORIDE 0.9 % IV SOLN
8.0000 mg/h | INTRAVENOUS | Status: AC
  Administered 2018-04-30 – 2018-05-02 (×8): 8 mg/h via INTRAVENOUS
  Filled 2018-04-29 (×10): qty 80

## 2018-04-29 MED ORDER — CIPROFLOXACIN IN D5W 400 MG/200ML IV SOLN
400.0000 mg | Freq: Once | INTRAVENOUS | Status: AC
  Administered 2018-04-29: 400 mg via INTRAVENOUS
  Filled 2018-04-29: qty 200

## 2018-04-29 MED ORDER — SODIUM BICARBONATE 8.4 % IV SOLN
50.0000 meq | Freq: Once | INTRAVENOUS | Status: AC
  Administered 2018-04-29: 50 meq via INTRAVENOUS
  Filled 2018-04-29: qty 50

## 2018-04-29 MED ORDER — DEXTROSE 50 % IV SOLN
25.0000 g | Freq: Once | INTRAVENOUS | Status: AC
  Administered 2018-04-29: 25 g via INTRAVENOUS
  Filled 2018-04-29: qty 50

## 2018-04-29 MED ORDER — CALCIUM GLUCONATE 10 % IV SOLN
1.0000 g | Freq: Once | INTRAVENOUS | Status: AC
  Administered 2018-04-29: 1 g via INTRAVENOUS
  Filled 2018-04-29: qty 10

## 2018-04-29 MED ORDER — SODIUM CHLORIDE 0.9 % IV SOLN
80.0000 mg | Freq: Once | INTRAVENOUS | Status: AC
  Administered 2018-04-29: 80 mg via INTRAVENOUS
  Filled 2018-04-29: qty 80

## 2018-04-29 MED ORDER — SODIUM CHLORIDE 0.9 % IV SOLN
10.0000 mL/h | Freq: Once | INTRAVENOUS | Status: AC
  Administered 2018-04-29: 10 mL/h via INTRAVENOUS

## 2018-04-29 MED ORDER — PANTOPRAZOLE SODIUM 40 MG IV SOLR
40.0000 mg | Freq: Two times a day (BID) | INTRAVENOUS | Status: DC
  Administered 2018-05-03: 40 mg via INTRAVENOUS
  Filled 2018-04-29: qty 40

## 2018-04-29 NOTE — ED Notes (Signed)
ED Provider at bedside. 

## 2018-04-29 NOTE — ED Provider Notes (Signed)
[facility] EMERGENCY DEPARTMENT Provider Note   CSN: [id number] Arrival date & time: 04/29/18  1905     History   Chief Complaint Chief Complaint  Patient presents with  . GI Bleeding    HPI Gary Ingram. is a 85 y.o. male.  Patient with hx anemia, gi bleeding, portal htn w esophageal varices, colon AVMs, who presents feeling generally weak, faint, in past 1-2 weeks. Symptoms gradual onset, moderate-severe, persistent/constant, slowly worse. Worse when stands. Family has noted he is very pale. In past week, stools black. Denies abd pain. Denies anticoag therapy. Denies nsaid use. No fever or chills.   The history is provided by the patient.    Past Medical History:  Diagnosis Date  . Adenomatous colon polyp   . Anemia   . AVM (arteriovenous malformation) of colon   . Chronic kidney disease, stage III (moderate) (HCC)   . Diabetes ([place])   . Dyslipidemia   . Esophageal varices ([place])   . GI bleed   . Helicobacter pylori gastritis   . Hypertension   . Iron deficiency anemia   . Obesity   . Peripheral neuropathy   . Portal hypertension ([place])   . Splenomegaly   . Thrombocytopenia ([place])   . Thrombocytopenia ([place])   . TIA (transient ischemic attack)    ?  Marland Kitchen Vertigo   . Vertigo     Patient Active Problem List   Diagnosis Date Noted  . IDA (iron deficiency anemia) 09/14/2015  . Esophageal varices ([place]) 06/10/2013  . AVM (arteriovenous malformation) of colon without hemorrhage 06/10/2013  . Benign neoplasm of colon 06/10/2013  . Thrombocytopenia ([place]) 05/31/2011    Past Surgical History:  Procedure Laterality Date  . COLONOSCOPY N/A 06/10/2013   Procedure: COLONOSCOPY;  Surgeon: Lafayette Dragon, MD;  Location: [facility] ENDOSCOPY;  Service: Endoscopy;  Laterality: N/A;  . ESOPHAGOGASTRODUODENOSCOPY N/A 06/10/2013   Procedure: ESOPHAGOGASTRODUODENOSCOPY (EGD);  Surgeon: Lafayette Dragon, MD;  Location: [facility];  Service: Endoscopy;  Laterality: N/A;  .  TONSILLECTOMY AND ADENOIDECTOMY          Home Medications    Prior to Admission medications   Medication Sig Start Date End Date Taking? Authorizing Provider  fenofibrate 160 MG tablet Take 160 mg by mouth daily.   Yes [provider]  losartan-hydrochlorothiazide (HYZAAR) 100-25 MG tablet Take 1 tablet by mouth daily.  01/20/13  Yes [provider]  metFORMIN (GLUCOPHAGE) 1000 MG tablet Take 1,000 mg by mouth 2 (two) times daily with a meal.    Yes [provider]  metoprolol (LOPRESSOR) 50 MG tablet 50 mg 2 (two) times daily.  07/13/11  Yes [provider]  Multiple Vitamins-Minerals (CENTRUM ADULTS) TABS Take by mouth every morning.   Yes [provider]  Multiple Vitamins-Minerals (PRESERVISION AREDS 2 PO) Take 1 tablet by mouth daily.   Yes [provider]  POLY-IRON 150 150 MG capsule Take 150 mg by mouth daily.  08/15/14  Yes [provider]  simvastatin (ZOCOR) 20 MG tablet Take 20 mg by mouth daily at 6 PM.  08/15/14  Yes [provider]  ONE TOUCH ULTRA TEST test strip every morning. 05/13/11   [provider]  traMADol (ULTRAM) 50 MG tablet Take 1 tablet (50 mg total) by mouth every 6 (six) hours as needed. Patient not taking: Reported on 04/29/2018 09/09/14   Volanda Napoleon, MD    Family History Family History  Problem Relation Age of Onset  .  Cancer Sister        x 2  . Diabetes Son   . Colon cancer Neg Hx     Social History Social History   Tobacco Use  . Smoking status: Former Smoker    Last attempt to quit: 06/10/1952    Years since quitting: 65.9  . Smokeless tobacco: Never Used  . Tobacco comment: never used tobacco  Substance Use Topics  . Alcohol use: No    Alcohol/week: 0.0 standard drinks  . Drug use: No     Allergies   Patient has no known allergies.   Review of Systems Review of Systems  Constitutional: Negative for fever.  HENT: Negative for sore throat.   Eyes:  Negative for redness.  Respiratory: Negative for shortness of breath.   Cardiovascular: Negative for chest pain.  Gastrointestinal: Positive for blood in stool and diarrhea. Negative for abdominal pain and vomiting.  Endocrine: Negative for polyuria.  Genitourinary: Negative for flank pain.  Musculoskeletal: Negative for back pain and neck pain.  Skin: Negative for rash.  Neurological: Negative for headaches.  Hematological: Does not bruise/bleed easily.  Psychiatric/Behavioral: Negative for confusion.     Physical Exam Updated Vital Signs BP (!) 100/42 (BP Location: Left Arm)   Pulse 67   Temp (!) 97.5 F (36.4 C) (Oral)   Resp 16   Ht 1.549 m (5\' 1" )   Wt 90.7 kg   SpO2 100%   BMI 37.79 kg/m   Physical Exam Vitals signs and nursing note reviewed.  Constitutional:      Appearance: Normal appearance. He is well-developed.  HENT:     Head: Atraumatic.     Nose: Nose normal.     Mouth/Throat:     Mouth: Mucous membranes are moist.  Eyes:     Conjunctiva/sclera: Conjunctivae normal.  Neck:     Musculoskeletal: Normal range of motion and neck supple.     Trachea: No tracheal deviation.  Cardiovascular:     Rate and Rhythm: Normal rate and regular rhythm.     Pulses: Normal pulses.     Heart sounds: No murmur. No friction rub. No gallop.   Pulmonary:     Effort: Pulmonary effort is normal. No accessory muscle usage or respiratory distress.     Breath sounds: Normal breath sounds.  Chest:     Chest wall: No tenderness.  Abdominal:     General: Bowel sounds are normal. There is no distension.     Palpations: Abdomen is soft. There is no mass.     Tenderness: There is no abdominal tenderness.  Genitourinary:    Comments: No cva tenderness. Black liquid stool, heme pos.  Skin:    General: Skin is warm and dry.     Coloration: Skin is pale.  Neurological:     Mental Status: He is alert.     Comments: Alert, oriented. Speech clear. Motor/sens grossly intact.     Psychiatric:        Mood and Affect: Mood normal.      ED Treatments / Results  Labs (all labs ordered are listed, but only abnormal results are displayed) Labs Reviewed  COMPREHENSIVE METABOLIC PANEL - Abnormal; Notable for the following components:      Result Value   Potassium 5.9 (*)    Chloride 113 (*)    CO2 19 (*)    Glucose, Bld 117 (*)    BUN 53 (*)    Creatinine, Ser 2.30 (*)    Calcium 8.3 (*)  Total Protein 5.1 (*)    Albumin 2.4 (*)    AST 56 (*)    GFR calc non Af Amer 25 (*)    GFR calc Af Amer 29 (*)    All other components within normal limits  CBC - Abnormal; Notable for the following components:   RBC 1.91 (*)    Hemoglobin 5.8 (*)    HCT 19.7 (*)    MCV 103.1 (*)    MCHC 29.4 (*)    RDW 15.9 (*)    All other components within normal limits  POC OCCULT BLOOD, ED - Abnormal; Notable for the following components:   Fecal Occult Bld POSITIVE (*)    All other components within normal limits  VITAMIN B12  FOLATE  IRON AND TIBC  FERRITIN  RETICULOCYTES  PROTIME-INR  TYPE AND SCREEN  PREPARE RBC (CROSSMATCH)    EKG EKG Interpretation  Date/Time:  Wednesday April 29 2018 20:03:18 EST Ventricular Rate:  66 PR Interval:    QRS Duration: 127 QT Interval:  439 QTC Calculation: 460 R Axis:   -4 Text Interpretation:  Sinus rhythm Nonspecific intraventricular conduction delay Nonspecific T wave abnormality No previous tracing Confirmed by Lajean Saver [phone]) on 04/29/2018 8:12:20 PM   Radiology No results found.  Procedures Procedures (including critical care time)  Medications Ordered in ED Medications  pantoprazole (PROTONIX) 80 mg in sodium chloride 0.9 % 100 mL IVPB (has no administration in time range)  0.9 %  sodium chloride infusion (has no administration in time range)  calcium gluconate inj 10% (1 g) URGENT USE ONLY! (has no administration in time range)  sodium bicarbonate injection 50 mEq (has no administration in time range)   dextrose 50 % solution 25 g (has no administration in time range)     Initial Impression / Assessment and Plan / ED Course  I have reviewed the triage vital signs and the nursing notes.  Pertinent labs & imaging results that were available during my care of the patient were reviewed by me and considered in my medical decision making (see chart for details).  Iv ns. Stat labs sent.  Reviewed nursing notes and prior charts for additional history.  Pt seen by [facility]Dr Olevia Perches in past - prior EGD with portal htn, esoph varices, colonoscopy w AVMs.   protonix iv.   Labs reviewed, hgb 5, ext low. Transfuse prbcs.   K is elevated. Ca gluc iv. [id number] iv. d50 iv.   [facility] consulted - discussed w MD on call - he will see in consult, requests medical service admit, based on hx requests we also give octreotide, and start on abx (cipro if not allergic).   Hospitalists consulted for admission.  CRITICAL CARE  RE: acute gi bleeding with severe anemia requiring emergent tranfusion, AKI, hyperkalemia requiring iv tx.  Performed by: Mirna Mires Total critical care time: 90 minutes Critical care time was exclusive of separately billable procedures and treating other patients. Critical care was necessary to treat or prevent imminent or life-threatening deterioration. Critical care was time spent personally by me on the following activities: development of treatment plan with patient and/or surrogate as well as nursing, discussions with consultants, evaluation of patient's response to treatment, examination of patient, obtaining history from patient or surrogate, ordering and performing treatments and interventions, ordering and review of laboratory studies, ordering and review of radiographic studies, pulse oximetry and re-evaluation of patient's condition.    Final Clinical Impressions(s) / ED Diagnoses   Final  diagnoses:  Symptomatic anemia  Acute GI bleeding    ED Discharge Orders     None       Lajean Saver, MD 04/30/18 [phone]

## 2018-04-29 NOTE — H&P (Signed)
History and Physical   Gary Ingram. FBP:[id number] DOB: [birth year] DOA: 04/29/2018  Referring MD/NP/PA: Dr Janean Sark  PCP: Marton Redwood, MD   Outpatient Specialists: [facility]   Patient coming from: Home  Chief Complaint: Weakness  HPI: Gary Ingram. is a 85 y.o. male with medical history significant of previous GI bleed 3 years ago secondary to colon AVMs and severe varices, hypertension, history of colonic polyps, chronic kidney disease stage III, diabetes, hyperlipidemia, TIA, portal hypertension, obesity who was brought in by family secondary to intermittent bleeding noted at home.  He has become weak in the last 1 to 2 weeks and very pale.  His stool has been very dark melanotic in the last week.  He is not on any anticoagulants.  Denied any taking NSAIDs and denied any abdominal pain.  Family has been trying to convince patient to come to the ER for the last 1 week but he has declined until today when he felt so weak.  In the ER patient was found to have brown stool but guaiac positive.  Patient also was found to have a hemoglobin of 5.8.  He is therefore being admitted for symptomatic anemia secondary to GI bleed..  ED Course: Temperature is 97.5 blood pressure 95/50 pulse 64 respirate of 22 oxygen sats 100% room air.  White count is 8.6 hemoglobin 5.8 platelet 229.  Potassium is 5.9 creatinine 2.3 with BUN of 53 and CO2 of 19 calcium 8.3 glucose 117.  Iron indices are consistent with iron deficiency.  INR 1.70 with PT of 19.7.  Fecal occult blood test is positive x2.  Patient evaluated and GI consulted.  Being admitted to the hospital for treatment.  Review of Systems: As per HPI otherwise 10 point review of systems negative.    Past Medical History:  Diagnosis Date  . Adenomatous colon polyp   . Anemia   . AVM (arteriovenous malformation) of colon   . Chronic kidney disease, stage III (moderate) (HCC)   . Diabetes ([place])   . Dyslipidemia   . Esophageal varices ([place])   . GI  bleed   . Helicobacter pylori gastritis   . Hypertension   . Iron deficiency anemia   . Obesity   . Peripheral neuropathy   . Portal hypertension ([place])   . Splenomegaly   . Thrombocytopenia ([place])   . Thrombocytopenia ([place])   . TIA (transient ischemic attack)    ?  Marland Kitchen Vertigo   . Vertigo     Past Surgical History:  Procedure Laterality Date  . COLONOSCOPY N/A 06/10/2013   Procedure: COLONOSCOPY;  Surgeon: Lafayette Dragon, MD;  Location: [facility];  Service: Endoscopy;  Laterality: N/A;  . ESOPHAGOGASTRODUODENOSCOPY N/A 06/10/2013   Procedure: ESOPHAGOGASTRODUODENOSCOPY (EGD);  Surgeon: Lafayette Dragon, MD;  Location: [facility];  Service: Endoscopy;  Laterality: N/A;  . TONSILLECTOMY AND ADENOIDECTOMY       reports that he quit smoking about 65 years ago. He has never used smokeless tobacco. He reports that he does not drink alcohol or use drugs.  No Known Allergies  Family History  Problem Relation Age of Onset  . Cancer Sister        x 2  . Diabetes Son   . Colon cancer Neg Hx      Prior to Admission medications   Medication Sig Start Date End Date Taking? Authorizing Provider  fenofibrate 160 MG tablet Take 160 mg by mouth daily.   Yes [provider]  losartan-hydrochlorothiazide Konrad Penta)  100-25 MG tablet Take 1 tablet by mouth daily.  01/20/13  Yes [provider]  metFORMIN (GLUCOPHAGE) 1000 MG tablet Take 1,000 mg by mouth 2 (two) times daily with a meal.    Yes [provider]  metoprolol (LOPRESSOR) 50 MG tablet 50 mg 2 (two) times daily.  07/13/11  Yes [provider]  Multiple Vitamins-Minerals (CENTRUM ADULTS) TABS Take by mouth every morning.   Yes [provider]  Multiple Vitamins-Minerals (PRESERVISION AREDS 2 PO) Take 1 tablet by mouth daily.   Yes [provider]  POLY-IRON 150 150 MG capsule Take 150 mg by mouth daily.  08/15/14  Yes [provider]  simvastatin (ZOCOR) 20 MG tablet Take 20 mg  by mouth daily at 6 PM.  08/15/14  Yes [provider]  ONE TOUCH ULTRA TEST test strip every morning. 05/13/11   [provider]  traMADol (ULTRAM) 50 MG tablet Take 1 tablet (50 mg total) by mouth every 6 (six) hours as needed. Patient not taking: Reported on 04/29/2018 09/09/14   Volanda Napoleon, MD    Physical Exam: Vitals:   04/29/18 1909 04/29/18 1914 04/29/18 2104  BP: (!) 100/42  (!) 102/52  Pulse: 67  65  Resp: 16  18  Temp: (!) 97.5 F (36.4 C)    TempSrc: Oral    SpO2: 100%  100%  Weight:  90.7 kg   Height:  5\' 1"  (1.549 m)       Constitutional: NAD, calm, very pale and looks weak Vitals:   04/29/18 1909 04/29/18 1914 04/29/18 2104  BP: (!) 100/42  (!) 102/52  Pulse: 67  65  Resp: 16  18  Temp: (!) 97.5 F (36.4 C)    TempSrc: Oral    SpO2: 100%  100%  Weight:  90.7 kg   Height:  5\' 1"  (1.549 m)    Eyes: PERRL, lids and conjunctivae pale ENMT: Mucous membranes are moist. Posterior pharynx clear of any exudate or lesions.Normal dentition.  Neck: normal, supple, no masses, no thyromegaly Respiratory: , no wheezing, mild bilateral crackles. Normal respiratory effort. No accessory muscle use.  Cardiovascular: Regular rate and rhythm, no murmurs / rubs / gallops. No extremity edema. 2+ pedal pulses. No carotid bruits.  Abdomen: no tenderness, no masses palpated. No hepatosplenomegaly. Bowel sounds positive.  Musculoskeletal: no clubbing / cyanosis. No joint deformity upper and lower extremities. Good ROM, no contractures. Normal muscle tone.  Skin:  Ulcer below right big toe, multiple fungal infections of the feet no rashes, lesions, ulcers. No induration Neurologic: CN 2-12 grossly intact. Sensation intact, DTR normal. Strength 5/5 in all 4.  Psychiatric: Normal judgment and insight. Alert and oriented x 3. Normal mood.     Labs on Admission: I have personally reviewed following labs and imaging studies  CBC: Recent Labs  Lab 04/29/18 1930    WBC 8.6  HGB 5.8*  HCT 19.7*  MCV 103.1*  PLT 242   Basic Metabolic Panel: Recent Labs  Lab 04/29/18 1930  NA 138  K 5.9*  CL 113*  CO2 19*  GLUCOSE 117*  BUN 53*  CREATININE 2.30*  CALCIUM 8.3*   GFR: Estimated Creatinine Clearance: 23.3 mL/min (A) (by C-G formula based on SCr of 2.3 mg/dL (H)). Liver Function Tests: Recent Labs  Lab 04/29/18 1930  AST 56*  ALT 19  ALKPHOS 40  BILITOT 0.3  PROT 5.1*  ALBUMIN 2.4*   No results for input(s): LIPASE, AMYLASE in the last 168  hours. No results for input(s): AMMONIA in the last 168 hours. Coagulation Profile: No results for input(s): INR, PROTIME in the last 168 hours. Cardiac Enzymes: No results for input(s): CKTOTAL, CKMB, CKMBINDEX, TROPONINI in the last 168 hours. BNP (last 3 results) No results for input(s): PROBNP in the last 8760 hours. HbA1C: No results for input(s): HGBA1C in the last 72 hours. CBG: No results for input(s): GLUCAP in the last 168 hours. Lipid Profile: No results for input(s): CHOL, HDL, LDLCALC, TRIG, CHOLHDL, LDLDIRECT in the last 72 hours. Thyroid Function Tests: No results for input(s): TSH, T4TOTAL, FREET4, T3FREE, THYROIDAB in the last 72 hours. Anemia Panel: No results for input(s): VITAMINB12, FOLATE, FERRITIN, TIBC, IRON, RETICCTPCT in the last 72 hours. Urine analysis: No results found for: COLORURINE, APPEARANCEUR, LABSPEC, PHURINE, GLUCOSEU, HGBUR, BILIRUBINUR, KETONESUR, PROTEINUR, UROBILINOGEN, NITRITE, LEUKOCYTESUR Sepsis Labs: @LABRCNTIP (procalcitonin:4,lacticidven:4) )No results found for this or any previous visit (from the past 240 hour(s)).   Radiological Exams on Admission: No results found.  EKG: Independently reviewed.  Normal sinus rhythm with nonspecific conduction abnormalities.  Unchanged from previous  Assessment/Plan Principal Problem:   Symptomatic anemia Active Problems:   Esophageal varices (HCC)   AVM (arteriovenous malformation) of colon without  hemorrhage   IDA (iron deficiency anemia)   DM2 (diabetes mellitus, type 2) (HCC)   Hyperlipidemia     #1 symptomatic anemia: Secondary to acute GI bleed.  With patient's history of AVMs as well as esophageal varices we will resume recurrence.  Patient will be initiated on IV Protonix and octreotide.  GI consulted.  Serial CBCs as well as IV fluid.  Will transfuse 2 units of packed red blood cells and have 2 more on hold.  Await further treatment and clarification by GI.  #2 diabetes: Sliding scale insulin.  Patient will be n.p.o. for possible procedure.  #3 hyperlipidemia: Hold statin for now.  Will resume when cleared by GI.  #4 hyperkalemia: Probably is hemolysis.  Recheck potassium level.  #5 right foot ulcer with fungal infection: Initiate IV Diflucan.  Transition to oral as well as cream prior to DC.   DVT prophylaxis: SCD Code Status: Full code Family Communication: Wife and granddaughter at bedside Disposition Plan: To be determined Consults called: Gastroenterology Admission status: Inpatient to progressive  Severity of Illness: The appropriate patient status for this patient is INPATIENT. Inpatient status is judged to be reasonable and necessary in order to provide the required intensity of service to ensure the patient's safety. The patient's presenting symptoms, physical exam findings, and initial radiographic and laboratory data in the context of their chronic comorbidities is felt to place them at high risk for further clinical deterioration. Furthermore, it is not anticipated that the patient will be medically stable for discharge from the hospital within 2 midnights of admission. The following factors support the patient status of inpatient.   " The patient's presenting symptoms include generalized weakness. " The worrisome physical exam findings include Pallor. " The initial radiographic and laboratory data are worrisome because of hemoglobin of 5.8. " The chronic  co-morbidities include history of GI bleed.   * I certify that at the point of admission it is my clinical judgment that the patient will require inpatient hospital care spanning beyond 2 midnights from the point of admission due to high intensity of service, high risk for further deterioration and high frequency of surveillance required.Barbette Merino MD [facility] Pager [phone]  If 7PM-7AM, please contact night-coverage [email or web address] Password [email or web address]  04/29/2018, 9:23 PM

## 2018-04-29 NOTE — ED Triage Notes (Signed)
Pt has been having diarrhea/loss of appetite since Thanksgiving with black stools. Pt has had an increase in falls, including two today. Denies having hit head at all. Pt takes Metformin. Pt pale looking in triage. Dr's office lab results showed hemoglobin of 7.3. Denies vomiting and pain.  Pt has hx of diabetes, family reports some type of black growth on the bottom of his right foot.

## 2018-04-30 ENCOUNTER — Encounter (HOSPITAL_COMMUNITY)
Admission: EM | Disposition: A | Discharge status: Home / Self Care | Payer: Self-pay | Source: Home / Self Care | Attending: Internal Medicine

## 2018-04-30 ENCOUNTER — Other Ambulatory Visit: Payer: Self-pay

## 2018-04-30 ENCOUNTER — Inpatient Hospital Stay (HOSPITAL_COMMUNITY): Payer: Medicare Other | Admitting: Certified Registered Nurse Anesthetist

## 2018-04-30 ENCOUNTER — Encounter (HOSPITAL_COMMUNITY): Payer: Self-pay | Admitting: Certified Registered Nurse Anesthetist

## 2018-04-30 DIAGNOSIS — I8511 Secondary esophageal varices with bleeding: Secondary | ICD-10-CM

## 2018-04-30 DIAGNOSIS — K7460 Unspecified cirrhosis of liver: Secondary | ICD-10-CM

## 2018-04-30 DIAGNOSIS — Q2733 Arteriovenous malformation of digestive system vessel: Secondary | ICD-10-CM

## 2018-04-30 DIAGNOSIS — I8510 Secondary esophageal varices without bleeding: Secondary | ICD-10-CM

## 2018-04-30 HISTORY — PX: ESOPHAGEAL BANDING: SHX5518

## 2018-04-30 HISTORY — PX: ESOPHAGOGASTRODUODENOSCOPY (EGD) WITH PROPOFOL: SHX5813

## 2018-04-30 LAB — CBC
HCT: 25.4 % — ABNORMAL LOW (ref 39.0–52.0)
HCT: 25.8 % — ABNORMAL LOW (ref 39.0–52.0)
HCT: 25 % — ABNORMAL LOW (ref 39.0–52.0)
Hemoglobin: 8 g/dL — ABNORMAL LOW (ref 13.0–17.0)
Hemoglobin: 8.1 g/dL — ABNORMAL LOW (ref 13.0–17.0)
Hemoglobin: 7.9 g/dL — ABNORMAL LOW (ref 13.0–17.0)
MCH: 29.6 pg (ref 26.0–34.0)
MCH: 29.7 pg (ref 26.0–34.0)
MCH: 30 pg (ref 26.0–34.0)
MCHC: 31.5 g/dL (ref 30.0–36.0)
MCHC: 31.4 g/dL (ref 30.0–36.0)
MCHC: 31.6 g/dL (ref 30.0–36.0)
MCV: 94.1 fL (ref 80.0–100.0)
MCV: 94.5 fL (ref 80.0–100.0)
MCV: 95.1 fL (ref 80.0–100.0)
PLATELETS: 130 10*3/uL — AB (ref 150–400)
PLATELETS: UNDETERMINED 10*3/uL (ref 150–400)
Platelets: 110 10*3/uL — ABNORMAL LOW (ref 150–400)
RBC: 2.7 MIL/uL — ABNORMAL LOW (ref 4.22–5.81)
RBC: 2.73 MIL/uL — ABNORMAL LOW (ref 4.22–5.81)
RBC: 2.63 MIL/uL — ABNORMAL LOW (ref 4.22–5.81)
RDW: 16.8 % — ABNORMAL HIGH (ref 11.5–15.5)
RDW: 17.2 % — AB (ref 11.5–15.5)
RDW: 17.4 % — ABNORMAL HIGH (ref 11.5–15.5)
WBC: 6.6 10*3/uL (ref 4.0–10.5)
WBC: 7.5 10*3/uL (ref 4.0–10.5)
WBC: 7.2 10*3/uL (ref 4.0–10.5)
nRBC: 0 % (ref 0.0–0.2)
nRBC: 0 % (ref 0.0–0.2)
nRBC: 0 % (ref 0.0–0.2)

## 2018-04-30 LAB — COMPREHENSIVE METABOLIC PANEL
ALT: 32 U/L (ref 0–44)
AST: 95 U/L — ABNORMAL HIGH (ref 15–41)
Albumin: 2.4 g/dL — ABNORMAL LOW (ref 3.5–5.0)
Alkaline Phosphatase: 37 U/L — ABNORMAL LOW (ref 38–126)
Anion gap: 7 (ref 5–15)
BILIRUBIN TOTAL: 0.7 mg/dL (ref 0.3–1.2)
BUN: 57 mg/dL — ABNORMAL HIGH (ref 8–23)
CALCIUM: 8.4 mg/dL — AB (ref 8.9–10.3)
CO2: 20 mmol/L — ABNORMAL LOW (ref 22–32)
Chloride: 113 mmol/L — ABNORMAL HIGH (ref 98–111)
Creatinine, Ser: 2.5 mg/dL — ABNORMAL HIGH (ref 0.61–1.24)
GFR calc Af Amer: 27 mL/min — ABNORMAL LOW (ref >60)
GFR, EST NON AFRICAN AMERICAN: 23 mL/min — AB (ref >60)
Glucose, Bld: 111 mg/dL — ABNORMAL HIGH (ref 70–99)
Potassium: 5.7 mmol/L — ABNORMAL HIGH (ref 3.5–5.1)
Sodium: 140 mmol/L (ref 135–145)
Total Protein: 5 g/dL — ABNORMAL LOW (ref 6.5–8.1)

## 2018-04-30 LAB — BASIC METABOLIC PANEL
Anion gap: 9 (ref 5–15)
BUN: 55 mg/dL — ABNORMAL HIGH (ref 8–23)
CALCIUM: 8.3 mg/dL — AB (ref 8.9–10.3)
CO2: 16 mmol/L — ABNORMAL LOW (ref 22–32)
Chloride: 113 mmol/L — ABNORMAL HIGH (ref 98–111)
Creatinine, Ser: 2.44 mg/dL — ABNORMAL HIGH (ref 0.61–1.24)
GFR calc Af Amer: 27 mL/min — ABNORMAL LOW (ref >60)
GFR calc non Af Amer: 24 mL/min — ABNORMAL LOW (ref >60)
GLUCOSE: 110 mg/dL — AB (ref 70–99)
Potassium: 5.4 mmol/L — ABNORMAL HIGH (ref 3.5–5.1)
Sodium: 138 mmol/L (ref 135–145)

## 2018-04-30 LAB — GLUCOSE, CAPILLARY
GLUCOSE-CAPILLARY: 99 mg/dL (ref 70–99)
GLUCOSE-CAPILLARY: 88 mg/dL (ref 70–99)
Glucose-Capillary: 107 mg/dL — ABNORMAL HIGH (ref 70–99)
Glucose-Capillary: 130 mg/dL — ABNORMAL HIGH (ref 70–99)
Glucose-Capillary: 128 mg/dL — ABNORMAL HIGH (ref 70–99)

## 2018-04-30 LAB — PREPARE RBC (CROSSMATCH)

## 2018-04-30 LAB — MRSA PCR SCREENING: MRSA BY PCR: NEGATIVE

## 2018-04-30 LAB — ABO/RH: ABO/RH(D): O POS

## 2018-04-30 SURGERY — ESOPHAGOGASTRODUODENOSCOPY (EGD) WITH PROPOFOL
Anesthesia: Monitor Anesthesia Care

## 2018-04-30 MED ORDER — FERROUS SULFATE 325 (65 FE) MG PO TABS
325.0000 mg | ORAL_TABLET | Freq: Three times a day (TID) | ORAL | Status: DC
  Administered 2018-05-01 – 2018-05-03 (×7): 325 mg via ORAL
  Filled 2018-04-30 (×7): qty 1

## 2018-04-30 MED ORDER — PROMETHAZINE HCL 25 MG/ML IJ SOLN
6.2500 mg | INTRAMUSCULAR | Status: DC | PRN
  Administered 2018-04-30: 6.25 mg via INTRAVENOUS

## 2018-04-30 MED ORDER — SODIUM CHLORIDE 0.9% IV SOLUTION: Freq: Once | INTRAVENOUS | Status: DC

## 2018-04-30 MED ORDER — ORAL CARE MOUTH RINSE
15.0000 mL | Freq: Two times a day (BID) | OROMUCOSAL | Status: DC
  Administered 2018-04-30 – 2018-05-02 (×5): 15 mL via OROMUCOSAL

## 2018-04-30 MED ORDER — INSULIN ASPART 100 UNIT/ML ~~LOC~~ SOLN
0.0000 [IU] | Freq: Three times a day (TID) | SUBCUTANEOUS | Status: DC
  Administered 2018-04-30 – 2018-05-02 (×4): 1 [IU] via SUBCUTANEOUS

## 2018-04-30 MED ORDER — ONDANSETRON HCL 4 MG/2ML IJ SOLN: 4.0000 mg | Freq: Four times a day (QID) | INTRAMUSCULAR | Status: DC | PRN

## 2018-04-30 MED ORDER — PROPOFOL 500 MG/50ML IV EMUL
INTRAVENOUS | Status: DC | PRN
  Administered 2018-04-30: 100 ug/kg/min via INTRAVENOUS

## 2018-04-30 MED ORDER — SODIUM CHLORIDE 0.9 % IV SOLN
INTRAVENOUS | Status: DC | PRN
  Administered 2018-04-30: 09:00:00 via INTRAVENOUS

## 2018-04-30 MED ORDER — SODIUM CHLORIDE 0.9 % IV SOLN
1.0000 g | INTRAVENOUS | Status: DC
  Administered 2018-04-30 – 2018-05-02 (×3): 1 g via INTRAVENOUS
  Filled 2018-04-30 (×2): qty 10
  Filled 2018-04-30: qty 1

## 2018-04-30 MED ORDER — SODIUM CHLORIDE 0.9 % IV SOLN
125.0000 mg | Freq: Once | INTRAVENOUS | Status: AC
  Administered 2018-04-30: 125 mg via INTRAVENOUS
  Filled 2018-04-30: qty 10

## 2018-04-30 MED ORDER — PROMETHAZINE HCL 25 MG/ML IJ SOLN
INTRAMUSCULAR | Status: AC
  Filled 2018-04-30: qty 1

## 2018-04-30 MED ORDER — ONDANSETRON HCL 4 MG PO TABS: 4.0000 mg | ORAL_TABLET | Freq: Four times a day (QID) | ORAL | Status: DC | PRN

## 2018-04-30 MED ORDER — CHLORHEXIDINE GLUCONATE 0.12 % MT SOLN
15.0000 mL | Freq: Two times a day (BID) | OROMUCOSAL | Status: DC
  Administered 2018-05-01 – 2018-05-03 (×4): 15 mL via OROMUCOSAL
  Filled 2018-04-30 (×5): qty 15

## 2018-04-30 SURGICAL SUPPLY — 15 items

## 2018-04-30 NOTE — Progress Notes (Signed)
Messaged attending MD re":Pts K still elevated at 5.4. Noticed tele changes, did EKG, showing incomplete RBBB which was not present on EKG from yesterday"

## 2018-04-30 NOTE — Anesthesia Preprocedure Evaluation (Signed)
Anesthesia Evaluation  Patient identified by MRN, date of birth, ID band Patient awake    Reviewed: Allergy & Precautions, NPO status , Patient's Chart, lab work & pertinent test results  History of Anesthesia Complications Negative for: history of anesthetic complications  Airway Mallampati: III  TM Distance: >3 FB Neck ROM: Full    Dental  (+) Dental Advisory Given, Edentulous Lower, Edentulous Upper   Pulmonary neg pulmonary ROS, former smoker,    Pulmonary exam normal breath sounds clear to auscultation       Cardiovascular hypertension, Pt. on medications Normal cardiovascular exam Rhythm:Regular Rate:Normal     Neuro/Psych TIA   GI/Hepatic Neg liver ROS, GI bleed   Endo/Other  diabetes, Type 2, Oral Hypoglycemic Agents  Renal/GU Renal InsufficiencyRenal disease     Musculoskeletal negative musculoskeletal ROS (+)   Abdominal   Peds  Hematology  (+) anemia ,   Anesthesia Other Findings Day of surgery medications reviewed with the patient.  Reproductive/Obstetrics                             Anesthesia Physical Anesthesia Plan  ASA: III and emergent  Anesthesia Plan: MAC   Post-op Pain Management:    Induction:   PONV Risk Score and Plan: Treatment may vary due to age or medical condition and Propofol infusion  Airway Management Planned: Natural Airway and Simple Face Mask  Additional Equipment:   Intra-op Plan:   Post-operative Plan:   Informed Consent: I have reviewed the patients History and Physical, chart, labs and discussed the procedure including the risks, benefits and alternatives for the proposed anesthesia with the patient or authorized representative who has indicated his/her understanding and acceptance.   Dental advisory given  Plan Discussed with: CRNA  Anesthesia Plan Comments:         Anesthesia Quick Evaluation

## 2018-04-30 NOTE — Op Note (Signed)
[facility] Patient Name: Gary Ingram Procedure Date : 04/30/2018 MRN: [id number] Attending MD: Gerrit Heck , MD Date of Birth: [birth year] CSN: [id number] Age: 85 Admit Type: Inpatient Procedure:                Upper GI endoscopy Indications:              Acute post hemorrhagic anemia, Melena Providers:                Gerrit Heck, MD, Carlyn Reichert, RN, Raynelle Bring, RN, Cletis Athens, Technician, Janeece Agee,                            Technician, Carver Fila Referring MD:              Medicines:                Monitored Anesthesia Care Complications:            No immediate complications. Estimated Blood Loss:     Estimated blood loss was minimal. Procedure:                Pre-Anesthesia Assessment:                           - Prior to the procedure, a History and Physical                            was performed, and patient medications and                            allergies were reviewed. The patient's tolerance of                            previous anesthesia was also reviewed. The risks                            and benefits of the procedure and the sedation                            options and risks were discussed with the patient.                            All questions were answered, and informed consent                            was obtained. Prior Anticoagulants: The patient has                            taken no previous anticoagulant or antiplatelet                            agents. ASA Grade Assessment: III - A patient with  severe systemic disease. After reviewing the risks                            and benefits, the patient was deemed in                            satisfactory condition to undergo the procedure.                           After obtaining informed consent, the endoscope was                            passed under direct vision. Throughout the   procedure, the patient's blood pressure, pulse, and                            oxygen saturations were monitored continuously. The                            [id number]-H190 ([id number]) Olympus gastroscope was                            introduced through the mouth, and advanced to the                            second part of duodenum. The upper GI endoscopy was                            accomplished without difficulty. The patient                            tolerated the procedure well. Scope In: Scope Out: Findings:      Grade II varices were found in the middle third of the esophagus and in       the lower third of the esophagus. They were 6 mm in largest diameter.       There were 3 areas of high grade stigmat of recent bleeding (red wale       signs), to include one active bleeding varix. A total of five bands were       successfully placed with complete eradication, resulting in deflation of       varices and cessation of bleeding. There was no bleeding at the end of       the procedure.      The Z-line was regular and was found 39 cm from the incisors.      The upper third of the esophagus was normal.      Mild portal hypertensive gastropathy was found in the gastric fundus and       in the gastric body.      The gastric antrum and pylorus were normal.      The duodenal bulb, first portion of the duodenum and second portion of       the duodenum were normal. Impression:               - Grade II esophageal varices with active bleeding  from 1 column and 2 additional red wale signs                            noted. Completely eradicated. 5 bands placed.                           - Z-line regular, 39 cm from the incisors.                           - Normal upper third of esophagus.                           - Portal hypertensive gastropathy.                           - Normal antrum and pylorus.                           - Normal duodenal bulb, first portion of  the                            duodenum and second portion of the duodenum.                           - No specimens collected. Recommendation:           - Return patient to hospital ward for ongoing care.                           - Clear liquid diet today.                           - Continue present medications.                           - Resume high dose PPI to promote mucosal healing.                           - Resume Octreotide.                           - Resume antibiotics- start Ceftriaxone.                           - Repeat upper endoscopy in 3-8 weeks for                            retreatment and eradication of varices.                           - Plan to start beta blocker on this admission and                            titrate to goal HR 55-60 for dual treatment of  bleeding esophageal varices. Procedure Code(s):        --- Professional ---                           [phone], Esophagogastroduodenoscopy, flexible,                            transoral; with band ligation of esophageal/gastric                            varices Diagnosis Code(s):        --- Professional ---                           I85.00, Esophageal varices without bleeding                           K76.6, Portal hypertension                           K31.89, Other diseases of stomach and duodenum                           D62, Acute posthemorrhagic anemia                           K92.1, Melena (includes Hematochezia) CPT copyright 2018 [employer]. All rights reserved. The codes documented in this report are preliminary and upon coder review may  be revised to meet current compliance requirements. Gerrit Heck, MD 04/30/2018 9:56:43 AM Number of Addenda: 0

## 2018-04-30 NOTE — Anesthesia Postprocedure Evaluation (Signed)
Anesthesia Post Note  Patient: Gary Ingram.  Procedure(s) Performed: ESOPHAGOGASTRODUODENOSCOPY (EGD) WITH PROPOFOL (N/A ) ESOPHAGEAL BANDING     Patient location during evaluation: PACU Anesthesia Type: MAC Level of consciousness: awake and alert Pain management: pain level controlled Vital Signs Assessment: post-procedure vital signs reviewed and stable Respiratory status: spontaneous breathing, nonlabored ventilation and respiratory function stable Cardiovascular status: stable and blood pressure returned to baseline Postop Assessment: no apparent nausea or vomiting Anesthetic complications: no    Last Vitals:  Vitals:   04/30/18 0800 04/30/18 0801  BP:  (!) 107/30  Pulse: 85 86  Resp: (!) 21 18  Temp:  36.8 C  SpO2: 98% 99%    Last Pain:  Vitals:   04/30/18 0801  TempSrc: Oral  PainSc: 0-No pain                 Brennan Bailey

## 2018-04-30 NOTE — Progress Notes (Signed)
Patient received from ED with second unit of blood transfusing. When signed and held orders released, now showing due for 2 more units. Discussed with TRH and per conversation only to give patient 2 units and then recheck levels prior to transfusing more blood. Will share information with day shift.   Milford Cage, RN

## 2018-04-30 NOTE — Transfer of Care (Signed)
Immediate Anesthesia Transfer of Care Note  Patient: Gary Ingram.  Procedure(s) Performed: ESOPHAGOGASTRODUODENOSCOPY (EGD) WITH PROPOFOL (N/A ) ESOPHAGEAL BANDING  Patient Location: Endoscopy Unit  Anesthesia Type:MAC  Level of Consciousness: awake, alert  and oriented  Airway & Oxygen Therapy: Patient Spontanous Breathing and Patient connected to nasal cannula oxygen  Post-op Assessment: Report given to RN and Post -op Vital signs reviewed and stable  Post vital signs: Reviewed and stable  Last Vitals:  Vitals Value Taken Time  BP 125/80   Temp    Pulse 78   Resp 14   SpO2 100     Last Pain:  Vitals:   04/30/18 0801  TempSrc: Oral  PainSc: 0-No pain         Complications: No apparent anesthesia complications

## 2018-04-30 NOTE — Progress Notes (Signed)
Following EGD, pt complains of nausea and vomited a small amount of dark red emesis. 6.25mg  of IV phenergan. Notified anesthesiologist and gastroenterologist.

## 2018-04-30 NOTE — Progress Notes (Signed)
PROGRESS NOTE    Gary Arch.  DJT:[id number] DOB: November 24, 1932 DOA: 04/29/2018 PCP: Marton Redwood, MD   Brief Narrative:   85 year old with history of previous GI bleed secondary to colonic AVM and esophageal varices, essential hypertension, colonic polyp, CKD stage III, diabetes mellitus type 2, hyperlipidemia, TIA, portal hypertension came to the hospital with complains of progressive worsening of shortness of breath over the course of past 2 weeks.  He also reported of some dark melanotic stools during this time.  In the ER he was noted to have hemoglobin of 5.8.  2 units of PRBC were transfused which improved his hemoglobin to 8.0.  He was taken for endoscopy the following day which ended up showing bleeding esophageal varices requiring band.  Assessment & Plan:   Principal Problem:   Symptomatic anemia Active Problems:   Esophageal varices (HCC)   AVM (arteriovenous malformation) of colon without hemorrhage   IDA (iron deficiency anemia)   DM2 (diabetes mellitus, type 2) (HCC)   Hyperlipidemia   Cirrhosis of liver without ascites (HCC)  Acute blood loss anemia secondary to esophageal variceal bleeding -Status post endoscopy showing active variceal bleeding status post banding 04/30/18 - Continue Protonix and octreotide at this time. -Closely monitor hemoglobin levels, transfuse if less than 7.0. -We will start patient on Rocephin as well. - 2 g salt diet, should be started on propranolol slowly as well as heart rate tolerates.  Hyperkalemia - This was done prior to the procedure and the transfusion.  We will repeat lab work at this time, will intervene accordingly.  Iron deficiency anemia, microcytic -Iron studies show significantly low ferritin and TIBC saturation.  1 dose of IV iron today followed by oral supplements tomorrow.  Diabetes mellitus type 2, insulin-dependent -Currently he is on insulin sliding scale and Accu-Cheks.  Fungal infection of his foot -Continue IV  Diflucan  Chronic kidney disease stage IV - This is likely combination of uncontrolled diabetes mellitus.  Baseline in 2017 was 1.9.  This time it is around 2.3.  Wonder if this is slowly increased.  Needs to follow-up outpatient with nephrology.  DVT prophylaxis: SCDs Code Status: Full code Family Communication: Son and wife at bedside Disposition Plan: Maintain inpatient stay for closer monitoring.  If hemodynamically remained stable, we can transfer him out from stepdown unit  Consultants:   Gastroenterology  Procedures:   Upper endoscopy 04/30/2018  Antimicrobials:   Rocephin day 1   Subjective: Patient did have melanotic stools overnight.  This morning tolerated EGD well.  No active bleeding at the moment  Review of Systems Otherwise negative except as per HPI, including: General: Denies fever, chills, night sweats or unintended weight loss. Resp: Denies cough, wheezing, shortness of breath. Cardiac: Denies chest pain, palpitations, orthopnea, paroxysmal nocturnal dyspnea. GI: Denies abdominal pain, nausea, vomiting, diarrhea or constipation GU: Denies dysuria, frequency, hesitancy or incontinence MS: Denies muscle aches, joint pain or swelling Neuro: Denies headache, neurologic deficits (focal weakness, numbness, tingling), abnormal gait Psych: Denies anxiety, depression, SI/HI/AVH Skin: Denies new rashes or lesions ID: Denies sick contacts, exotic exposures, travel  Objective: Vitals:   04/30/18 0945 04/30/18 0950 04/30/18 0955 04/30/18 1005  BP: (!) 126/36  (!) 110/33 (!) 131/40  Pulse: 82 81 85 91  Resp: 18  15 14   Temp:      TempSrc:      SpO2: 100% 100% 100% 100%  Weight:      Height:        Intake/Output Summary (Last 24  hours) at 04/30/2018 1139 Last data filed at 04/30/2018 1000 Gross per 24 hour  Intake 1480.93 ml  Output -  Net 1480.93 ml   Filed Weights   04/29/18 1914 04/30/18 0801  Weight: 90.7 kg 90.7 kg    Examination:  General exam:  Elderly frail-appearing Respiratory system: Clear to auscultation. Respiratory effort normal. Cardiovascular system: S1 & S2 heard, RRR. No JVD, murmurs, rubs, gallops or clicks. No pedal edema. Gastrointestinal system: Abdomen is nondistended, soft and nontender. No organomegaly or masses felt. Normal bowel sounds heard. Central nervous system: Alert and oriented. No focal neurological deficits. Extremities: Symmetric 4 x 5 power. Skin: Ulceration and dryness of the right toe concerning for fungal infection. Psychiatry: Judgement and insight appear normal. Mood & affect appropriate.     Data Reviewed:   CBC: Recent Labs  Lab 04/29/18 1930 04/30/18 0656 04/30/18 1104  WBC 8.6 6.6 7.5  HGB 5.8* 8.0* 8.1*  HCT 19.7* 25.4* 25.8*  MCV 103.1* 94.1 94.5  PLT 229 110* 941*   Basic Metabolic Panel: Recent Labs  Lab 04/29/18 1930 04/30/18 0656  NA 138 140  K 5.9* 5.7*  CL 113* 113*  CO2 19* 20*  GLUCOSE 117* 111*  BUN 53* 57*  CREATININE 2.30* 2.50*  CALCIUM 8.3* 8.4*   GFR: Estimated Creatinine Clearance: 21.4 mL/min (A) (by C-G formula based on SCr of 2.5 mg/dL (H)). Liver Function Tests: Recent Labs  Lab 04/29/18 1930 04/30/18 0656  AST 56* 95*  ALT 19 32  ALKPHOS 40 37*  BILITOT 0.3 0.7  PROT 5.1* 5.0*  ALBUMIN 2.4* 2.4*   No results for input(s): LIPASE, AMYLASE in the last 168 hours. No results for input(s): AMMONIA in the last 168 hours. Coagulation Profile: Recent Labs  Lab 04/29/18 2055  INR 1.70   Cardiac Enzymes: No results for input(s): CKTOTAL, CKMB, CKMBINDEX, TROPONINI in the last 168 hours. BNP (last 3 results) No results for input(s): PROBNP in the last 8760 hours. HbA1C: No results for input(s): HGBA1C in the last 72 hours. CBG: Recent Labs  Lab 04/30/18 0327 04/30/18 0816  GLUCAP 99 88   Lipid Profile: No results for input(s): CHOL, HDL, LDLCALC, TRIG, CHOLHDL, LDLDIRECT in the last 72 hours. Thyroid Function Tests: No results for  input(s): TSH, T4TOTAL, FREET4, T3FREE, THYROIDAB in the last 72 hours. Anemia Panel: Recent Labs    04/29/18 2055  VITAMINB12 370  FOLATE 13.6  FERRITIN 15*  TIBC 409  IRON 20*  RETICCTPCT 2.9   Sepsis Labs: No results for input(s): PROCALCITON, LATICACIDVEN in the last 168 hours.  Recent Results (from the past 240 hour(s))  MRSA PCR Screening     Status: None   Collection Time: 04/30/18  3:21 AM  Result Value Ref Range Status   MRSA by PCR NEGATIVE NEGATIVE Final    Comment:        The GeneXpert MRSA Assay (FDA approved for NASAL specimens only), is one component of a comprehensive MRSA colonization surveillance program. It is not intended to diagnose MRSA infection nor to guide or monitor treatment for MRSA infections. Performed at [facility], [place] [address] [place], [place] [address]          Radiology Studies: No results found.      Scheduled Meds: . sodium chloride   Intravenous Once  . chlorhexidine  15 mL Mouth Rinse BID  . insulin aspart  0-9 Units Subcutaneous TID WC  . mouth rinse  15 mL Mouth Rinse q12n4p  . [  START ON 05/03/2018] pantoprazole  40 mg Intravenous Q12H   Continuous Infusions: . cefTRIAXone (ROCEPHIN)  IV    . fluconazole (DIFLUCAN) IV Stopped (04/30/18 0455)  . octreotide  (SANDOSTATIN)    IV infusion 50 mcg/hr (04/30/18 0900)  . pantoprozole (PROTONIX) infusion 8 mg/hr (04/30/18 0900)     LOS: 1 day   Time spent= 40 mins    Gary Cancro Arsenio Loader, MD [facility] Pager [phone]   If 7PM-7AM, please contact night-coverage [email or web address] Password [facility] 04/30/2018, 11:39 AM

## 2018-04-30 NOTE — Interval H&P Note (Signed)
History and Physical Interval Note:  04/30/2018 9:08 AM  Gary Ingram.  has presented today for surgery, with the diagnosis of GI bleed  The various methods of treatment have been discussed with the patient and family. After consideration of risks, benefits and other options for treatment, the patient has consented to  Procedure(s): ESOPHAGOGASTRODUODENOSCOPY (EGD) WITH PROPOFOL (N/A) as a surgical intervention .  The patient's history has been reviewed, patient examined, no change in status, stable for surgery.  I have reviewed the patient's chart and labs.  Questions were answered to the patient's satisfaction.     [name]

## 2018-04-30 NOTE — Consult Note (Signed)
Consultation  Referring Provider:     Leanna Battles, MD Primary Care Physician:  Marton Redwood, MD Primary Gastroenterologist:      [facility] (previously Dr. Elicia Lamp) Reason for Consultation:     Melena, anemia, fatigue         HPI:   Gary Hubers. is a 85 y.o. male with a history of cirrhosis (unclear etiology) complicated by esophageal varices and portal hypertensive gastropathy, hypertension, CKD stage III, diabetes, history of TIA presenting with a 1 to 2-week history of fatigue and melena.  Per family he is additionally been having soft/watery stools for the last month or so.  He denies any abdominal pain.  He denies any nausea, vomiting, fever, chills.  He does have a history of iron deficiency anemia evaluated in 2015 by Dr. Maurene Capes with EGD and colonoscopy.  Otherwise no history of overt GI blood loss.  EGD at that time notable for 3 columns of grade 2 esophageal varices without high risk stigmata (that was his index diagnosis of cirrhosis), H. pylori gastritis, and normal duodenum.  Colonoscopy with a subcentimeter benign polyp in the descending colon which was resected in 2 small cecal AVMs treated with APC.  H. pylori treated with triple therapy, recommended to start beta-blocker for the esophageal varices and consideration of VCE to rule out small bowel AVMs, which was never completed.  He is not on any systemic anticoagulation and not prescribed any antiplatelet therapy, and he is unclear of whether or not he takes an over-the-counter aspirin.  On arrival to the emergency department, hemoglobin/hematocrit 5.8/19.7 with MCV/RDW 103.1/15.9.  Last H/H for comparison was 12.2/35.1 in 08/2015 with MCV/RDW 98/13.7 at that time.  BUN/creatinine 53/2.3, both elevated from baseline 2 years ago.  He was transfused 2 units PRBCs overnight with repeat hemoglobin 8 this morning.  INR was 1.7, albumin 2.4, AST/ALT 56/19, T bili 0.3.  B12/folate 370/13 but significant iron deficiency with  ferritin 15, iron 20, TIBC 409, iron saturation 5%.  Past Medical History:  Diagnosis Date  . Adenomatous colon polyp   . Anemia   . AVM (arteriovenous malformation) of colon   . Chronic kidney disease, stage III (moderate) (HCC)   . Diabetes ([place])   . Dyslipidemia   . Esophageal varices ([place])   . GI bleed   . Helicobacter pylori gastritis   . Hypertension   . Iron deficiency anemia   . Obesity   . Peripheral neuropathy   . Portal hypertension ([place])   . Splenomegaly   . Thrombocytopenia ([place])   . Thrombocytopenia ([place])   . TIA (transient ischemic attack)    ?  Marland Kitchen Vertigo   . Vertigo     Past Surgical History:  Procedure Laterality Date  . COLONOSCOPY N/A 06/10/2013   Procedure: COLONOSCOPY;  Surgeon: Lafayette Dragon, MD;  Location: [facility] END[facility]COPY;  Service: Endoscopy;  Laterality: N/A;  . ESOPHAGOGASTRODUODENOSCOPY N/A 06/10/2013   Procedure: ESOPHAGOGASTRODUODENOSCOPY (EGD);  Surgeon: Lafayette Dragon, MD;  Location: [facility];  Service: Endoscopy;  Laterality: N/A;  . TONSILLECTOMY AND ADENOIDECTOMY      Family History  Problem Relation Age of Onset  . Cancer Sister        x 2  . Diabetes Son   . Colon cancer Neg Hx      Social History   Tobacco Use  . Smoking status: Former Smoker    Last attempt to quit: 06/10/1952    Years since quitting: 65.9  .  Smokeless tobacco: Never Used  . Tobacco comment: never used tobacco  Substance Use Topics  . Alcohol use: No    Alcohol/week: 0.0 standard drinks  . Drug use: No    Prior to Admission medications   Medication Sig Start Date End Date Taking? Authorizing Provider  fenofibrate 160 MG tablet Take 160 mg by mouth daily.   Yes [provider]  losartan-hydrochlorothiazide (HYZAAR) 100-25 MG tablet Take 1 tablet by mouth daily.  01/20/13  Yes [provider]  metFORMIN (GLUCOPHAGE) 1000 MG tablet Take 1,000 mg by mouth 2 (two) times daily with a meal.    Yes [provider]  metoprolol  (LOPRESSOR) 50 MG tablet 50 mg 2 (two) times daily.  07/13/11  Yes [provider]  Multiple Vitamins-Minerals (CENTRUM ADULTS) TABS Take by mouth every morning.   Yes [provider]  Multiple Vitamins-Minerals (PRESERVISION AREDS 2 PO) Take 1 tablet by mouth daily.   Yes [provider]  POLY-IRON 150 150 MG capsule Take 150 mg by mouth daily.  08/15/14  Yes [provider]  simvastatin (ZOCOR) 20 MG tablet Take 20 mg by mouth daily at 6 PM.  08/15/14  Yes [provider]  ONE TOUCH ULTRA TEST test strip every morning. 05/13/11   [provider]  traMADol (ULTRAM) 50 MG tablet Take 1 tablet (50 mg total) by mouth every 6 (six) hours as needed. Patient not taking: Reported on 04/29/2018 09/09/14   Volanda Napoleon, MD    Current Facility-Administered Medications  Medication Dose Route Frequency Provider Last Rate Last Dose  . [MAR Hold] 0.9 %  sodium chloride infusion (Manually program via Guardrails IV Fluids)   Intravenous Once Gala Romney L, MD      . chlorhexidine (PERIDEX) 0.12 % solution 15 mL  15 mL Mouth Rinse BID Amin, Ankit Chirag, MD      . Doug Sou Hold] fluconazole (DIFLUCAN) IVPB 100 mg  100 mg Intravenous Q24H Elwyn Reach, MD   Stopped at 04/30/18 0455  . [MAR Hold] insulin aspart (novoLOG) injection 0-9 Units  0-9 Units Subcutaneous TID WC Garba, Mohammad L, MD      . MEDLINE mouth rinse  15 mL Mouth Rinse q12n4p Amin, Ankit Chirag, MD      . octreotide (SANDOSTATIN) 500 mcg in sodium chloride 0.9 % 250 mL (2 mcg/mL) infusion  50 mcg/hr Intravenous Continuous Elwyn Reach, MD 25 mL/hr at 04/30/18 0747 50 mcg/hr at 04/30/18 0747  . [MAR Hold] ondansetron (ZOFRAN) tablet 4 mg  4 mg Oral Q6H PRN Elwyn Reach, MD       Or  . Doug Sou Hold] ondansetron (ZOFRAN) injection 4 mg  4 mg Intravenous Q6H PRN Gala Romney L, MD      . pantoprazole (PROTONIX) 80 mg in sodium chloride 0.9 % 250 mL (0.32 mg/mL) infusion  8 mg/hr  Intravenous Continuous Elwyn Reach, MD 25 mL/hr at 04/30/18 0746 8 mg/hr at 04/30/18 0746  . [MAR Hold] pantoprazole (PROTONIX) injection 40 mg  40 mg Intravenous Q12H Elwyn Reach, MD        Allergies as of 04/29/2018  . (No Known Allergies)     Review of Systems:    As per HPI, otherwise negative    Physical Exam:  Vital signs in last 24 hours: Temp:  [97.5 F (36.4 C)-98.7 F (37.1 C)] 98.2 F (36.8 C) (01/02 0801) Pulse Rate:  [64-86] 86 (01/02 0801) Resp:  [16-24] 18 (01/02 0801) BP: (  95-120)/(30-58) 107/30 (01/02 0801) SpO2:  [91 %-100 %] 99 % (01/02 0801) Weight:  [90.7 kg] 90.7 kg (01/02 0801) Last BM Date: 04/30/18 General:   Pleasant male in NAD Head:  Normocephalic and atraumatic. Eyes:   No icterus.   Conjunctiva pink. Ears:  Normal auditory acuity. Neck:  Supple Lungs:  Respirations even and unlabored. Lungs clear to auscultation bilaterally.   No wheezes, crackles, or rhonchi.  Heart:  Regular rate and rhythm; no MRG Abdomen:  Soft, nondistended, nontender. Normal bowel sounds. No appreciable masses or hepatomegaly.  Rectal:  Not performed.  Msk:  Symmetrical without gross deformities.  Extremities:  Without edema. Neurologic:  Alert and  oriented x4;  grossly normal neurologically. Skin:  Intact without significant lesions or rashes. Psych:  Alert and cooperative. Normal affect.  LAB RESULTS: Recent Labs    04/29/18 1930 04/30/18 0656  WBC 8.6 6.6  HGB 5.8* 8.0*  HCT 19.7* 25.4*  PLT 229 110*   BMET Recent Labs    04/29/18 1930 04/30/18 0656  NA 138 140  K 5.9* 5.7*  CL 113* 113*  CO2 19* 20*  GLUCOSE 117* 111*  BUN 53* 57*  CREATININE 2.30* 2.50*  CALCIUM 8.3* 8.4*   LFT Recent Labs    04/30/18 0656  PROT 5.0*  ALBUMIN 2.4*  AST 95*  ALT 32  ALKPHOS 37*  BILITOT 0.7   PT/INR Recent Labs    04/29/18 2055  LABPROT 19.7*  INR 1.70    STUDIES: No results found.   PREVIOUS ENDOSCOPIES:            -EGD  (05/2013, Dr. Maurene Capes, for iron deficiency anemia): 3 columns of grade 2 esophageal varices without high risk stigmata, H. pylori gastritis, normal duodenum -Colonoscopy (05/2013, Dr. Maurene Capes, for iron deficiency anemia): Subcentimeter benign polyp in the descending colon, 2 small cecal AVMs treated with APC   Impression / Plan:   85 year old male with a history of cirrhosis of unclear etiology (?  NASH) complicated by esophageal varices, coagulopathy, hypoalbuminemia, presenting with melena, fatigue, anemia with presenting hemoglobin 5.8.  Discussed clinical presentation with potential etiologies with the patient, his wife and his daughter at bedside, will plan on urgent EGD for diagnostic and therapeutic intent this morning.  - Treated with PRBCs overnight.  Will resume serial H/H checks - Started on high-dose PPI as well as octreotide for bleed in the setting of cirrhosis.   - Start ceftriaxone - Urgent EGD for diagnostic and therapeutic intent this morning  The indications, risks, and benefits of EGD were explained to the patient in detail. Risks include but are not limited to bleeding, perforation, adverse reaction to medications, and cardiopulmonary compromise. Sequelae include but are not limited to the possibility of surgery, hositalization, and mortality. The patient verbalized understanding and wished to proceed. All questions answered. Further recommendations pending results of the exam.   Gerrit Heck, DO, [facility]   LOS: 1 day   Lavena Bullion  04/30/2018, 8:56 AM

## 2018-04-30 NOTE — ED Notes (Signed)
Paper consent form was completed at bedside. Completed another consent for electronic record at this time, signed by son.

## 2018-04-30 NOTE — Anesthesia Procedure Notes (Signed)
Procedure Name: MAC Date/Time: 04/30/2018 9:00 AM Performed by: Alain Marion, CRNA Pre-anesthesia Checklist: Patient identified, Emergency Drugs available, Suction available and Patient being monitored Oxygen Delivery Method: Simple face mask Placement Confirmation: positive ETCO2

## 2018-04-30 NOTE — Progress Notes (Signed)
Pts K still elevated @ 5.7 this AM. Relayed to Dr. Reesa Chew. Plan to redraw labs when pt back from EGD

## 2018-04-30 NOTE — H&P (View-Only) (Signed)
Consultation  Referring Provider:     Leanna Battles, MD Primary Care Physician:  Marton Redwood, MD Primary Gastroenterologist:      [facility] (previously Dr. Elicia Lamp) Reason for Consultation:     Melena, anemia, fatigue         HPI:   Gary Justen. is a 85 y.o. male with a history of cirrhosis (unclear etiology) complicated by esophageal varices and portal hypertensive gastropathy, hypertension, CKD stage III, diabetes, history of TIA presenting with a 1 to 2-week history of fatigue and melena.  Per family he is additionally been having soft/watery stools for the last month or so.  He denies any abdominal pain.  He denies any nausea, vomiting, fever, chills.  He does have a history of iron deficiency anemia evaluated in 2015 by Dr. Maurene Capes with EGD and colonoscopy.  Otherwise no history of overt GI blood loss.  EGD at that time notable for 3 columns of grade 2 esophageal varices without high risk stigmata (that was his index diagnosis of cirrhosis), H. pylori gastritis, and normal duodenum.  Colonoscopy with a subcentimeter benign polyp in the descending colon which was resected in 2 small cecal AVMs treated with APC.  H. pylori treated with triple therapy, recommended to start beta-blocker for the esophageal varices and consideration of VCE to rule out small bowel AVMs, which was never completed.  He is not on any systemic anticoagulation and not prescribed any antiplatelet therapy, and he is unclear of whether or not he takes an over-the-counter aspirin.  On arrival to the emergency department, hemoglobin/hematocrit 5.8/19.7 with MCV/RDW 103.1/15.9.  Last H/H for comparison was 12.2/35.1 in 08/2015 with MCV/RDW 98/13.7 at that time.  BUN/creatinine 53/2.3, both elevated from baseline 2 years ago.  He was transfused 2 units PRBCs overnight with repeat hemoglobin 8 this morning.  INR was 1.7, albumin 2.4, AST/ALT 56/19, T bili 0.3.  B12/folate 370/13 but significant iron deficiency with  ferritin 15, iron 20, TIBC 409, iron saturation 5%.  Past Medical History:  Diagnosis Date  . Adenomatous colon polyp   . Anemia   . AVM (arteriovenous malformation) of colon   . Chronic kidney disease, stage III (moderate) (HCC)   . Diabetes ([place])   . Dyslipidemia   . Esophageal varices ([place])   . GI bleed   . Helicobacter pylori gastritis   . Hypertension   . Iron deficiency anemia   . Obesity   . Peripheral neuropathy   . Portal hypertension ([place])   . Splenomegaly   . Thrombocytopenia ([place])   . Thrombocytopenia ([place])   . TIA (transient ischemic attack)    ?  Marland Kitchen Vertigo   . Vertigo     Past Surgical History:  Procedure Laterality Date  . COLONOSCOPY N/A 06/10/2013   Procedure: COLONOSCOPY;  Surgeon: Lafayette Dragon, MD;  Location: [facility];  Service: Endoscopy;  Laterality: N/A;  . ESOPHAGOGASTRODUODENOSCOPY N/A 06/10/2013   Procedure: ESOPHAGOGASTRODUODENOSCOPY (EGD);  Surgeon: Lafayette Dragon, MD;  Location: [facility];  Service: Endoscopy;  Laterality: N/A;  . TONSILLECTOMY AND ADENOIDECTOMY      Family History  Problem Relation Age of Onset  . Cancer Sister        x 2  . Diabetes Son   . Colon cancer Neg Hx      Social History   Tobacco Use  . Smoking status: Former Smoker    Last attempt to quit: 06/10/1952    Years since quitting: 65.9  .  Smokeless tobacco: Never Used  . Tobacco comment: never used tobacco  Substance Use Topics  . Alcohol use: No    Alcohol/week: 0.0 standard drinks  . Drug use: No    Prior to Admission medications   Medication Sig Start Date End Date Taking? Authorizing Provider  fenofibrate 160 MG tablet Take 160 mg by mouth daily.   Yes [provider]  losartan-hydrochlorothiazide (HYZAAR) 100-25 MG tablet Take 1 tablet by mouth daily.  01/20/13  Yes [provider]  metFORMIN (GLUCOPHAGE) 1000 MG tablet Take 1,000 mg by mouth 2 (two) times daily with a meal.    Yes [provider]  metoprolol  (LOPRESSOR) 50 MG tablet 50 mg 2 (two) times daily.  07/13/11  Yes [provider]  Multiple Vitamins-Minerals (CENTRUM ADULTS) TABS Take by mouth every morning.   Yes [provider]  Multiple Vitamins-Minerals (PRESERVISION AREDS 2 PO) Take 1 tablet by mouth daily.   Yes [provider]  POLY-IRON 150 150 MG capsule Take 150 mg by mouth daily.  08/15/14  Yes [provider]  simvastatin (ZOCOR) 20 MG tablet Take 20 mg by mouth daily at 6 PM.  08/15/14  Yes [provider]  ONE TOUCH ULTRA TEST test strip every morning. 05/13/11   [provider]  traMADol (ULTRAM) 50 MG tablet Take 1 tablet (50 mg total) by mouth every 6 (six) hours as needed. Patient not taking: Reported on 04/29/2018 09/09/14   Volanda Napoleon, MD    Current Facility-Administered Medications  Medication Dose Route Frequency Provider Last Rate Last Dose  . [MAR Hold] 0.9 %  sodium chloride infusion (Manually program via Guardrails IV Fluids)   Intravenous Once Gala Romney L, MD      . chlorhexidine (PERIDEX) 0.12 % solution 15 mL  15 mL Mouth Rinse BID Amin, Ankit Chirag, MD      . Doug Sou Hold] fluconazole (DIFLUCAN) IVPB 100 mg  100 mg Intravenous Q24H Elwyn Reach, MD   Stopped at 04/30/18 0455  . [MAR Hold] insulin aspart (novoLOG) injection 0-9 Units  0-9 Units Subcutaneous TID WC Garba, Mohammad L, MD      . MEDLINE mouth rinse  15 mL Mouth Rinse q12n4p Amin, Ankit Chirag, MD      . octreotide (SANDOSTATIN) 500 mcg in sodium chloride 0.9 % 250 mL (2 mcg/mL) infusion  50 mcg/hr Intravenous Continuous Elwyn Reach, MD 25 mL/hr at 04/30/18 0747 50 mcg/hr at 04/30/18 0747  . [MAR Hold] ondansetron (ZOFRAN) tablet 4 mg  4 mg Oral Q6H PRN Elwyn Reach, MD       Or  . Doug Sou Hold] ondansetron (ZOFRAN) injection 4 mg  4 mg Intravenous Q6H PRN Gala Romney L, MD      . pantoprazole (PROTONIX) 80 mg in sodium chloride 0.9 % 250 mL (0.32 mg/mL) infusion  8 mg/hr  Intravenous Continuous Elwyn Reach, MD 25 mL/hr at 04/30/18 0746 8 mg/hr at 04/30/18 0746  . [MAR Hold] pantoprazole (PROTONIX) injection 40 mg  40 mg Intravenous Q12H Elwyn Reach, MD        Allergies as of 04/29/2018  . (No Known Allergies)     Review of Systems:    As per HPI, otherwise negative    Physical Exam:  Vital signs in last 24 hours: Temp:  [97.5 F (36.4 C)-98.7 F (37.1 C)] 98.2 F (36.8 C) (01/02 0801) Pulse Rate:  [64-86] 86 (01/02 0801) Resp:  [16-24] 18 (01/02 0801) BP: (  95-120)/(30-58) 107/30 (01/02 0801) SpO2:  [91 %-100 %] 99 % (01/02 0801) Weight:  [90.7 kg] 90.7 kg (01/02 0801) Last BM Date: 04/30/18 General:   Pleasant male in NAD Head:  Normocephalic and atraumatic. Eyes:   No icterus.   Conjunctiva pink. Ears:  Normal auditory acuity. Neck:  Supple Lungs:  Respirations even and unlabored. Lungs clear to auscultation bilaterally.   No wheezes, crackles, or rhonchi.  Heart:  Regular rate and rhythm; no MRG Abdomen:  Soft, nondistended, nontender. Normal bowel sounds. No appreciable masses or hepatomegaly.  Rectal:  Not performed.  Msk:  Symmetrical without gross deformities.  Extremities:  Without edema. Neurologic:  Alert and  oriented x4;  grossly normal neurologically. Skin:  Intact without significant lesions or rashes. Psych:  Alert and cooperative. Normal affect.  LAB RESULTS: Recent Labs    04/29/18 1930 04/30/18 0656  WBC 8.6 6.6  HGB 5.8* 8.0*  HCT 19.7* 25.4*  PLT 229 110*   BMET Recent Labs    04/29/18 1930 04/30/18 0656  NA 138 140  K 5.9* 5.7*  CL 113* 113*  CO2 19* 20*  GLUCOSE 117* 111*  BUN 53* 57*  CREATININE 2.30* 2.50*  CALCIUM 8.3* 8.4*   LFT Recent Labs    04/30/18 0656  PROT 5.0*  ALBUMIN 2.4*  AST 95*  ALT 32  ALKPHOS 37*  BILITOT 0.7   PT/INR Recent Labs    04/29/18 2055  LABPROT 19.7*  INR 1.70    STUDIES: No results found.   PREVIOUS ENDOSCOPIES:            -EGD  (05/2013, Dr. Maurene Capes, for iron deficiency anemia): 3 columns of grade 2 esophageal varices without high risk stigmata, H. pylori gastritis, normal duodenum -Colonoscopy (05/2013, Dr. Maurene Capes, for iron deficiency anemia): Subcentimeter benign polyp in the descending colon, 2 small cecal AVMs treated with APC   Impression / Plan:   85 year old male with a history of cirrhosis of unclear etiology (?  NA[facility]) complicated by esophageal varices, coagulopathy, hypoalbuminemia, presenting with melena, fatigue, anemia with presenting hemoglobin 5.8.  Discussed clinical presentation with potential etiologies with the patient, his wife and his daughter at bedside, will plan on urgent EGD for diagnostic and therapeutic intent this morning.  - Treated with PRBCs overnight.  Will resume serial H/H checks - Started on high-dose PPI as well as octreotide for bleed in the setting of cirrhosis.   - Start ceftriaxone - Urgent EGD for diagnostic and therapeutic intent this morning  The indications, risks, and benefits of EGD were explained to the patient in detail. Risks include but are not limited to bleeding, perforation, adverse reaction to medications, and cardiopulmonary compromise. Sequelae include but are not limited to the possibility of surgery, hositalization, and mortality. The patient verbalized understanding and wished to proceed. All questions answered. Further recommendations pending results of the exam.   Gary Heck, DO, [facility]   LOS: 1 day   Lavena Bullion  04/30/2018, 8:56 AM

## 2018-05-01 LAB — CBC
HCT: 21.9 % — ABNORMAL LOW (ref 39.0–52.0)
HCT: 23.5 % — ABNORMAL LOW (ref 39.0–52.0)
Hemoglobin: 7 g/dL — ABNORMAL LOW (ref 13.0–17.0)
Hemoglobin: 7.4 g/dL — ABNORMAL LOW (ref 13.0–17.0)
MCH: 29.5 pg (ref 26.0–34.0)
MCH: 30.7 pg (ref 26.0–34.0)
MCHC: 31.5 g/dL (ref 30.0–36.0)
MCHC: 32 g/dL (ref 30.0–36.0)
MCV: 93.6 fL (ref 80.0–100.0)
MCV: 96.1 fL (ref 80.0–100.0)
Platelets: 100 10*3/uL — ABNORMAL LOW (ref 150–400)
Platelets: 106 10*3/uL — ABNORMAL LOW (ref 150–400)
RBC: 2.28 MIL/uL — ABNORMAL LOW (ref 4.22–5.81)
RBC: 2.51 MIL/uL — ABNORMAL LOW (ref 4.22–5.81)
RDW: 17.5 % — AB (ref 11.5–15.5)
RDW: 17.9 % — ABNORMAL HIGH (ref 11.5–15.5)
WBC: 5.3 10*3/uL (ref 4.0–10.5)
WBC: 6 10*3/uL (ref 4.0–10.5)
nRBC: 0 % (ref 0.0–0.2)
nRBC: 0 % (ref 0.0–0.2)

## 2018-05-01 LAB — GLUCOSE, CAPILLARY
Glucose-Capillary: 101 mg/dL — ABNORMAL HIGH (ref 70–99)
Glucose-Capillary: 130 mg/dL — ABNORMAL HIGH (ref 70–99)
Glucose-Capillary: 140 mg/dL — ABNORMAL HIGH (ref 70–99)
Glucose-Capillary: 139 mg/dL — ABNORMAL HIGH (ref 70–99)

## 2018-05-01 LAB — BASIC METABOLIC PANEL
Anion gap: 6 (ref 5–15)
BUN: 51 mg/dL — ABNORMAL HIGH (ref 8–23)
CALCIUM: 8 mg/dL — AB (ref 8.9–10.3)
CO2: 17 mmol/L — ABNORMAL LOW (ref 22–32)
Chloride: 116 mmol/L — ABNORMAL HIGH (ref 98–111)
Creatinine, Ser: 2.47 mg/dL — ABNORMAL HIGH (ref 0.61–1.24)
GFR calc Af Amer: 27 mL/min — ABNORMAL LOW (ref >60)
GFR calc non Af Amer: 23 mL/min — ABNORMAL LOW (ref >60)
GLUCOSE: 118 mg/dL — AB (ref 70–99)
Potassium: 5.1 mmol/L (ref 3.5–5.1)
Sodium: 139 mmol/L (ref 135–145)

## 2018-05-01 LAB — HEMOGLOBIN AND HEMATOCRIT, BLOOD
HCT: 26.5 % — ABNORMAL LOW (ref 39.0–52.0)
Hemoglobin: 8.2 g/dL — ABNORMAL LOW (ref 13.0–17.0)

## 2018-05-01 LAB — PREPARE RBC (CROSSMATCH)

## 2018-05-01 LAB — MAGNESIUM: Magnesium: 1.8 mg/dL (ref 1.7–2.4)

## 2018-05-01 MED ORDER — SODIUM CHLORIDE 0.9 % IV BOLUS
500.0000 mL | Freq: Once | INTRAVENOUS | Status: AC
  Administered 2018-05-01: 500 mL via INTRAVENOUS

## 2018-05-01 MED ORDER — SODIUM CHLORIDE 0.9% IV SOLUTION
Freq: Once | INTRAVENOUS | Status: AC
  Administered 2018-05-01: 12:00:00 via INTRAVENOUS

## 2018-05-01 MED ORDER — FLUCONAZOLE 100 MG PO TABS
100.0000 mg | ORAL_TABLET | Freq: Every day | ORAL | Status: DC
  Administered 2018-05-01 – 2018-05-03 (×3): 100 mg via ORAL
  Filled 2018-05-01 (×3): qty 1

## 2018-05-01 NOTE — Progress Notes (Signed)
Pt stating nausea, not vomiting. Will wait until tomorrow to advance diet further. Will keep pt on full liquids overnight.

## 2018-05-01 NOTE — Consult Note (Signed)
[facility] ROUNDING NOTE   Subjective: EGD completed yesterday and notable for 3 columns of esophageal varices with red wale sign and one active bleeding varix, treated with 5 bands.  No subsequent bleeding overnight.  Hypotension this morning treated with IV fluids with plan for an additional unit of PRBCs.  Otherwise, he states that he feels well and tolerating clears.  No BM in the last 24 hours.  Hemoglobin 7.0 this morning from 7.4 yesterday.   Objective: Vital signs in last 24 hours: Temp:  [97.8 F (36.6 C)-99.1 F (37.3 C)] 97.8 F (36.6 C) (01/03 1445) Pulse Rate:  [76-90] 84 (01/03 1445) Resp:  [0-23] 19 (01/03 1445) BP: (76-134)/(29-94) 105/52 (01/03 1445) SpO2:  [94 %-99 %] 97 % (01/03 1445) Last BM Date: 04/30/18 General: NAD Lungs: CTA Heart: RRR, no m/r/g Abdomen: Soft, nondistended, nontender Ext: no c/c/e    Intake/Output from previous day: 01/02 0701 - 01/03 0700 In: 2061.6 [I.V.:1311.4; IV Piggyback:750.2] Out: 750 [Urine:750] Intake/Output this shift: Total I/O In: 967.1 [P.O.:100; I.V.:344.6; Blood:422.5; IV Piggyback:100] Out: 425 [Urine:425]   Lab Results: Recent Labs    04/30/18 1549 04/30/18 2341 05/01/18 0816  WBC 7.2 6.0 5.3  HGB 7.9* 7.4* 7.0*  PLT PLATELET CLUMPS NOTED ON SMEAR, UNABLE TO ESTIMATE 106* 100*  MCV 95.1 93.6 96.1   BMET Recent Labs    04/30/18 0656 04/30/18 1104 05/01/18 0816  NA 140 138 139  K 5.7* 5.4* 5.1  CL 113* 113* 116*  CO2 20* 16* 17*  GLUCOSE 111* 110* 118*  BUN 57* 55* 51*  CREATININE 2.50* 2.44* 2.47*  CALCIUM 8.4* 8.3* 8.0*   LFT Recent Labs    04/29/18 1930 04/30/18 0656  PROT 5.1* 5.0*  ALBUMIN 2.4* 2.4*  AST 56* 95*  ALT 19 32  ALKPHOS 40 37*  BILITOT 0.3 0.7   PT/INR Recent Labs    04/29/18 2055  INR 1.70      Imaging/Other results: No results found.    Assessment   85 year old male with a history of cirrhosis (unclear etiology) complicated by esophageal  varices and portal hypertensive gastropathy admitted with acute upper GI bleed secondary to esophageal varices now status post EGD with EVL on 04/30/2018.  - Resume clears for now.  If no overt evidence of ongoing bleeding, can advance tomorrow -Resume octreotide through today and pending clinical stability can likely stop tomorrow -Continue ceftriaxone through today, and can also likely stop or transition to p.o. antibiotics tomorrow for a total of 5 to 7 days -Resume high-dose acid suppression therapy.  Convert to Protonix 40 mg p.o. twice daily tomorrow - Plan for repeat EGD in 3 to 8 weeks for repeat banding for variceal eradication - Can alternatively consider TIPS in select patient populations after acute variceal bleeding - Recommend starting beta-blocker prior to hospital discharge for dual therapy -Agree with additional unit PRBCs today with posttransfusion and serial H/H checks    Lavena Bullion, DO  05/01/2018, 3:37 PM [facility] Pager [phone]

## 2018-05-01 NOTE — Consult Note (Signed)
[facility] Nurse wound consult note Reason for Consult: toe ulceration Wound type: callous Unclear, but family reports no one treating on outpatient basis Pressure Injury POA: NA Measurement:2cmx 1cm x 0cm  Wound bed: hard, hyperkeratotic callous  Drainage (amount, consistency, odor) none Periwound:intact, weak distal pulses  Dressing procedure/placement/frequency: Paint with betadine daily. Allow to air dry. Follow up with primary care or podietrist of choice to pare unless auto amputates its self.    Discussed POC with patient and bedside nurse.  Re consult if needed, will not follow at this time. Thanks  Sonny Poth [employer], RN,CWOCN, CNS, [place] [phone])

## 2018-05-01 NOTE — Progress Notes (Signed)
12:44 AM Patient with dropping BP. MAP in upper 40's to low 60's, BP cuff adjusted and changed. Given patient's recent GI bleeding and banding, paged [facility] night coverage. Patient's Hgb is 7.4. Awaiting orders.   2:47 AM Orders received to give 539ml bolus of NS. Will continue to monitor.   Milford Cage, RN

## 2018-05-01 NOTE — Progress Notes (Addendum)
[facility] TEAM 1 - Stepdown/ICU TEAM  Gary Arch.  ZOX:[id number] DOB: 12/29/1932 DOA: 04/29/2018 PCP: Marton Redwood, MD    Brief Narrative:  85yo M w/ a hx of GIB due to colonic AVMs and esophageal varices, cryptogenic cirrhosis, HTN, colonic polyp, CKD stage III, DM2, HLD, TIA, and portal HTN who came to the hospital with shortness of breath developing over 2 weeks w/ melanotic stools.  In the ER he was found to have a Hgb of 5.8.  Endoscopy the following day revealed bleeding esophageal varices requiring banding.   Significant Events: 1/2 admit 1/2 EGD w/ banding of varices   Subjective: Significant hypotension was encountered earlier this morning and treated with a volume bolus.  Blood pressure appears stable at this time.  Hemoglobin appears to be slowly drifting down.  He is tolerating his clear liquid diet without difficulty and is quite hungry.  He denies dizziness lightheadedness chest pain or shortness of breath.  He has not had a bowel movement in approximately 24 hours.  Assessment & Plan:  UGIB - esophageal variceal bleeding -EGD noted active variceal bleeding status post banding 04/30/18 -Continue Protonix and octreotide -Clinically suspect no further active bleeding  Acute blood loss anemia  S/p 2U PRBC thus far - IV Fe infused -doubt current active blood loss but will transfuse 1 additional unit as patient is equilibrating right at transfusion threshold and was hypotensive last night  Hyperkalemia Mild -follow without specific treatment at this time  DM2 CBGs well controlled  Tinea pedis  Continue Diflucan  Acute kidney injury on Chronic kidney disease stage IV baseline crt in 2017 was 1.9 -volume expanded and follow  DVT prophylaxis: SCDs Code Status: FULL CODE Family Communication: Spoke with wife at bedside Disposition Plan: Stepdown bed  Consultants:  GI  Antimicrobials:  Rocephin 1/2 >  Objective: Blood pressure 121/60, pulse 89,  temperature 98.9 F (37.2 C), temperature source Oral, resp. rate 20, height 5\' 1"  (1.549 m), weight 90.7 kg, SpO2 98 %.  Intake/Output Summary (Last 24 hours) at 05/01/2018 1032 Last data filed at 05/01/2018 0900 Gross per 24 hour  Intake 1855.69 ml  Output 900 ml  Net 955.69 ml   Filed Weights   04/29/18 1914 04/30/18 0801  Weight: 90.7 kg 90.7 kg    Examination: General: No acute respiratory distress Lungs: Clear to auscultation bilaterally without wheezes or crackles Cardiovascular: Regular rate and rhythm without murmur gallop or rub normal S1 and S2 Abdomen: Nontender, nondistended, soft, bowel sounds positive, no rebound, no ascites, no appreciable mass Extremities: No significant cyanosis, clubbing, or edema bilateral lower extremities  CBC: Recent Labs  Lab 04/30/18 1549 04/30/18 2341 05/01/18 0816  WBC 7.2 6.0 5.3  HGB 7.9* 7.4* 7.0*  HCT 25.0* 23.5* 21.9*  MCV 95.1 93.6 96.1  PLT PLATELET CLUMPS NOTED ON SMEAR, UNABLE TO ESTIMATE 106* 811*   Basic Metabolic Panel: Recent Labs  Lab 04/30/18 0656 04/30/18 1104 05/01/18 0816  NA 140 138 139  K 5.7* 5.4* 5.1  CL 113* 113* 116*  CO2 20* 16* 17*  GLUCOSE 111* 110* 118*  BUN 57* 55* 51*  CREATININE 2.50* 2.44* 2.47*  CALCIUM 8.4* 8.3* 8.0*  MG  --   --  1.8   GFR: Estimated Creatinine Clearance: 21.7 mL/min (A) (by C-G formula based on SCr of 2.47 mg/dL (H)).  Liver Function Tests: Recent Labs  Lab 04/29/18 1930 04/30/18 0656  AST 56* 95*  ALT 19 32  ALKPHOS 40 37*  BILITOT  0.3 0.7  PROT 5.1* 5.0*  ALBUMIN 2.4* 2.4*    Coagulation Profile: Recent Labs  Lab 04/29/18 2055  INR 1.70    CBG: Recent Labs  Lab 04/30/18 0816 04/30/18 1238 04/30/18 1602 04/30/18 2158 05/01/18 0748  GLUCAP 88 107* 130* 128* 101*    Recent Results (from the past 240 hour(s))  MRSA PCR Screening     Status: None   Collection Time: 04/30/18  3:21 AM  Result Value Ref Range Status   MRSA by PCR NEGATIVE  NEGATIVE Final    Comment:        The GeneXpert MRSA Assay (FDA approved for NASAL specimens only), is one component of a comprehensive MRSA colonization surveillance program. It is not intended to diagnose MRSA infection nor to guide or monitor treatment for MRSA infections. Performed at [facility], [place] [address] [place], [place] [address]      Scheduled Meds: . chlorhexidine  15 mL Mouth Rinse BID  . ferrous sulfate  325 mg Oral TID WC  . insulin aspart  0-9 Units Subcutaneous TID WC  . mouth rinse  15 mL Mouth Rinse q12n4p  . [START ON 05/03/2018] pantoprazole  40 mg Intravenous Q12H   Continuous Infusions: . cefTRIAXone (ROCEPHIN)  IV 1 g (04/30/18 1250)  . fluconazole (DIFLUCAN) IV Stopped (05/01/18 0044)  . octreotide  (SANDOSTATIN)    IV infusion 50 mcg/hr (05/01/18 0600)  . pantoprozole (PROTONIX) infusion 8 mg/hr (05/01/18 1024)     LOS: 2 days   Gary Altes, MD [facility] Office  [phone] Pager - Text Page per Amion  If 7PM-7AM, please contact night-coverage per Amion 05/01/2018, 10:32 AM

## 2018-05-02 LAB — COMPREHENSIVE METABOLIC PANEL
ALT: 37 U/L (ref 0–44)
ANION GAP: 5 (ref 5–15)
AST: 107 U/L — ABNORMAL HIGH (ref 15–41)
Albumin: 2.4 g/dL — ABNORMAL LOW (ref 3.5–5.0)
Alkaline Phosphatase: 38 U/L (ref 38–126)
BUN: 44 mg/dL — AB (ref 8–23)
CHLORIDE: 116 mmol/L — AB (ref 98–111)
CO2: 19 mmol/L — ABNORMAL LOW (ref 22–32)
CREATININE: 2.41 mg/dL — AB (ref 0.61–1.24)
Calcium: 8 mg/dL — ABNORMAL LOW (ref 8.9–10.3)
GFR calc Af Amer: 28 mL/min — ABNORMAL LOW (ref >60)
GFR calc non Af Amer: 24 mL/min — ABNORMAL LOW (ref >60)
Glucose, Bld: 128 mg/dL — ABNORMAL HIGH (ref 70–99)
Potassium: 4.6 mmol/L (ref 3.5–5.1)
Sodium: 140 mmol/L (ref 135–145)
Total Bilirubin: 0.4 mg/dL (ref 0.3–1.2)
Total Protein: 5.1 g/dL — ABNORMAL LOW (ref 6.5–8.1)

## 2018-05-02 LAB — MAGNESIUM: Magnesium: 1.7 mg/dL (ref 1.7–2.4)

## 2018-05-02 LAB — CBC
HCT: 26.1 % — ABNORMAL LOW (ref 39.0–52.0)
HEMOGLOBIN: 8.3 g/dL — AB (ref 13.0–17.0)
MCH: 30.5 pg (ref 26.0–34.0)
MCHC: 31.8 g/dL (ref 30.0–36.0)
MCV: 96 fL (ref 80.0–100.0)
NRBC: 0 % (ref 0.0–0.2)
PLATELETS: 93 10*3/uL — AB (ref 150–400)
RBC: 2.72 MIL/uL — ABNORMAL LOW (ref 4.22–5.81)
RDW: 17.9 % — ABNORMAL HIGH (ref 11.5–15.5)
WBC: 5.4 10*3/uL (ref 4.0–10.5)

## 2018-05-02 LAB — GLUCOSE, CAPILLARY
GLUCOSE-CAPILLARY: 108 mg/dL — AB (ref 70–99)
Glucose-Capillary: 135 mg/dL — ABNORMAL HIGH (ref 70–99)
Glucose-Capillary: 131 mg/dL — ABNORMAL HIGH (ref 70–99)

## 2018-05-02 LAB — PHOSPHORUS: Phosphorus: 3.2 mg/dL (ref 2.5–4.6)

## 2018-05-02 MED ORDER — SODIUM CHLORIDE 0.9 % IV SOLN
50.0000 ug/h | INTRAVENOUS | Status: DC
  Filled 2018-05-02: qty 1

## 2018-05-02 MED ORDER — CIPROFLOXACIN HCL 250 MG PO TABS
250.0000 mg | ORAL_TABLET | Freq: Two times a day (BID) | ORAL | Status: DC
  Filled 2018-05-02 (×3): qty 1

## 2018-05-02 MED ORDER — METOPROLOL TARTRATE 25 MG PO TABS
25.0000 mg | ORAL_TABLET | Freq: Two times a day (BID) | ORAL | Status: DC
  Administered 2018-05-02 – 2018-05-03 (×3): 25 mg via ORAL
  Filled 2018-05-02 (×3): qty 1

## 2018-05-02 MED ORDER — METOPROLOL TARTRATE 50 MG PO TABS: 50.0000 mg | ORAL_TABLET | Freq: Two times a day (BID) | ORAL | Status: DC

## 2018-05-02 NOTE — Consult Note (Signed)
 GASTROENTEROLOGY ROUNDING NOTE   Subjective: He reports no acute events overnight and feeling better today. Tolerating PO intake and no ongoing e/o bleeding. 1U pRBCs yesterday for hypotension and Hgb 7, with repeat 8.2 and 8.3 and downtrending BUN to 44. Asking to advance his diet a bit to soft foods.    Objective: Vital signs in last 24 hours: Temp:  [97.7 F (36.5 C)-97.8 F (36.6 C)] 97.7 F (36.5 C) (01/04 1223) Pulse Rate:  [70-86] 80 (01/04 1300) Resp:  [14-22] 18 (01/04 1300) BP: (83-148)/(44-92) 128/65 (01/04 1300) SpO2:  [90 %-99 %] 97 % (01/04 1300) Last BM Date: 04/30/18 General: NAD Lungs: CTA b/l, no w/r/r Heart: RRR, no m/r/g Abdomen: Soft, NT, ND, +BS Ext: No c/c/e    Intake/Output from previous day: 01/03 0701 - 01/04 0700 In: 1592.2 [P.O.:300; I.V.:1192.2; IV Piggyback:100] Out: 1200 [Urine:1200] Intake/Output this shift: Total I/O In: 395.1 [I.V.:299.6; IV Piggyback:95.5] Out: 250 [Urine:250]   Lab Results: Recent Labs    04/30/18 2341 05/01/18 0816 05/01/18 1641 05/02/18 0242  WBC 6.0 5.3  --  5.4  HGB 7.4* 7.0* 8.2* 8.3*  PLT 106* 100*  --  93*  MCV 93.6 96.1  --  96.0   BMET Recent Labs    04/30/18 1104 05/01/18 0816 05/02/18 0242  NA 138 139 140  K 5.4* 5.1 4.6  CL 113* 116* 116*  CO2 16* 17* 19*  GLUCOSE 110* 118* 128*  BUN 55* 51* 44*  CREATININE 2.44* 2.47* 2.41*  CALCIUM 8.3* 8.0* 8.0*   LFT Recent Labs    04/29/18 1930 04/30/18 0656 05/02/18 0242  PROT 5.1* 5.0* 5.1*  ALBUMIN 2.4* 2.4* 2.4*  AST 56* 95* 107*  ALT 19 32 37  ALKPHOS 40 37* 38  BILITOT 0.3 0.7 0.4   PT/INR Recent Labs    04/29/18 2055  INR 1.70      Imaging/Other results: No results found.    Assessment and Plan:  85 year old male with a history of cirrhosis (unclear etiology) complicated by esophageal varices and portal hypertensive gastropathy admitted with acute upper GI bleed secondary to esophageal varices now status post  EGD with EVL on 04/30/2018.  - Ok to advance diet to soft foods today. - Ok to complete octreotide today and stop -Okay to transition antibiotics to p.o. and complete 5 days - Okay to change to Protonix 40 mg p.o. twice daily.  We will plan for 6 weeks of high-dose acid suppression therapy, then can titrate to lowest effective dose from there - Did discuss guideline considerations for TIPS in patients after acute variceal bleeding.  Discussed with the patient and his family and he does not wish to entertain TIPS at this time. - Plan for repeat EGD as an outpatient in 3 to 8 weeks for repeat banding for variceal eradication - Was on metoprolol prior to admission.  Recommend continued beta-blocker prior to hospital discharge for dual therapy -Daily H&H checks    Lavena Bullion, DO  05/02/2018, 2:11 PM [facility] Pager [phone]

## 2018-05-02 NOTE — Progress Notes (Signed)
[name] - Stepdown/ICU TEAM  Gary Ingram.  OZD:[id number] DOB: 1932/11/22 DOA: 04/29/2018 PCP: Marton Redwood, MD    Brief Narrative:  85yo M w/ a hx of GIB due to colonic AVMs and esophageal varices, cryptogenic cirrhosis, HTN, colonic polyp, CKD stage III, DM2, HLD, TIA, and portal HTN who came to the hospital with shortness of breath developing over 2 weeks w/ melanotic stools.  In the ER he was found to have a Hgb of 5.8.  Endoscopy the following day revealed bleeding esophageal varices requiring banding.  Significant Events: 1/2 admit 1/2 EGD w/ banding of varices   Subjective: No new complaints today. Tolerating and enjoying his advanced diet. Denies any further obvious bleeding. No cp, sob, n/v, or abdom pain.   Assessment & Plan:  UGIB - esophageal variceal bleeding -EGD noted active variceal bleeding status post banding 04/30/18 -Continue Protonix at high dose for 6 weeks - octreotide to stop after current bag infused  -Clinically suspect no further active bleeding -transition abx to oral and stop after 5 days of tx -resume oral BB   Acute blood loss anemia  S/p 3U PRBC thus far - IV Fe infused - Hgb appears stable at this time - f/u CBC in AM  Hyperkalemia Resolved   DM2 CBGs well controlled  Tinea pedis w/ callous Continue Diflucan - WOC has seen   Acute kidney injury on Chronic kidney disease stage IV baseline crt in 2017 was 1.9 - crt slowly improving w/ volume resuscitation   DVT prophylaxis: SCDs Code Status: FULL CODE Family Communication: Spoke with wife at bedside Disposition Plan: stable for tele bed - begin PT/OT - watch Hgb and renal fxn - ?D/C ~48hrs  Consultants:  GI  Antimicrobials:  Rocephin 1/2 >  Objective: Blood pressure 128/65, pulse 80, temperature 97.7 F (36.5 C), temperature source Oral, resp. rate 18, height 5\' 1"  (1.549 m), weight 90.7 kg, SpO2 97 %.  Intake/Output Summary (Last 24 hours) at 05/02/2018 1400 Last  data filed at 05/02/2018 1300 Gross per 24 hour  Intake 1442.8 ml  Output 1125 ml  Net 317.8 ml   Filed Weights   04/29/18 1914 04/30/18 0801  Weight: 90.7 kg 90.7 kg    Examination: General: No acute respiratory distress Lungs: Clear to auscultation bilaterally w/o wheezing  Cardiovascular: RRR - no M or rub  Abdomen: Nontender, overweight, soft, bowel sounds positive, no rebound, no ascites, no appreciable mass Extremities: No C/C/E B LE   CBC: Recent Labs  Lab 04/30/18 2341 05/01/18 0816 05/01/18 1641 05/02/18 0242  WBC 6.0 5.3  --  5.4  HGB 7.4* 7.0* 8.2* 8.3*  HCT 23.5* 21.9* 26.5* 26.1*  MCV 93.6 96.1  --  96.0  PLT 106* 100*  --  93*   Basic Metabolic Panel: Recent Labs  Lab 04/30/18 1104 05/01/18 0816 05/02/18 0242  NA 138 139 140  K 5.4* 5.1 4.6  CL 113* 116* 116*  CO2 16* 17* 19*  GLUCOSE 110* 118* 128*  BUN 55* 51* 44*  CREATININE 2.44* 2.47* 2.41*  CALCIUM 8.3* 8.0* 8.0*  MG  --  1.8 1.7  PHOS  --   --  3.2   GFR: Estimated Creatinine Clearance: 22.2 mL/min (A) (by C-G formula based on SCr of 2.41 mg/dL (H)).  Liver Function Tests: Recent Labs  Lab 04/29/18 1930 04/30/18 0656 05/02/18 0242  AST 56* 95* 107*  ALT 19 32 37  ALKPHOS 40 37* 38  BILITOT 0.3 0.7 0.4  PROT 5.1* 5.0* 5.1*  ALBUMIN 2.4* 2.4* 2.4*    Coagulation Profile: Recent Labs  Lab 04/29/18 2055  INR 1.70    CBG: Recent Labs  Lab 05/01/18 1114 05/01/18 1528 05/01/18 2214 05/02/18 0811 05/02/18 1223  GLUCAP 130* 140* 139* 108* 135*    Recent Results (from the past 240 hour(s))  MRSA PCR Screening     Status: None   Collection Time: 04/30/18  3:21 AM  Result Value Ref Range Status   MRSA by PCR NEGATIVE NEGATIVE Final    Comment:        The GeneXpert MRSA Assay (FDA approved for NASAL specimens only), is one component of a comprehensive MRSA colonization surveillance program. It is not intended to diagnose MRSA infection nor to guide or monitor  treatment for MRSA infections. Performed at [facility], [place] [address], [place], [place] [address]      Scheduled Meds: . chlorhexidine  15 mL Mouth Rinse BID  . ferrous sulfate  325 mg Oral TID WC  . fluconazole  100 mg Oral Daily  . insulin aspart  0-9 Units Subcutaneous TID WC  . mouth rinse  15 mL Mouth Rinse q12n4p  . [START ON 05/03/2018] pantoprazole  40 mg Intravenous Q12H   Continuous Infusions: . cefTRIAXone (ROCEPHIN)  IV 200 mL/hr at 05/02/18 1300  . octreotide  (SANDOSTATIN)    IV infusion 50 mcg/hr (05/02/18 1339)  . pantoprozole (PROTONIX) infusion 8 mg/hr (05/02/18 1300)     LOS: 3 days   Cherene Altes, MD [facility] Office  [phone] Pager - Text Page per Amion  If 7PM-7AM, please contact night-coverage per Amion 05/02/2018, 2:00 PM

## 2018-05-02 NOTE — Evaluation (Signed)
Physical Therapy Evaluation Patient Details Name: Gary Ingram. MRN: [id number] DOB: [birth year] Today's Date: 05/02/2018   History of Present Illness  85yo M w/ a hx of GIB due to colonic AVMs and esophageal varices, cryptogenic cirrhosis, HTN, colonic polyp, CKD stage III, DM2, HLD, TIA, and portal HTN who came to the hospital with shortness of breath developing over 2 weeks w/ melanotic stools. Underwent EGD with bleeding varices and banding on 05/01/18.  Clinical Impression  Pt admitted with above diagnosis. Pt currently with functional limitations due to the deficits listed below (see PT Problem List). Pt needing mod A +2 to stand from bed and chair and min A +2 for ambulation safety. Discussed with family that pt is appropriate for SNF but at this time pt's son wants to take off work and take pt home with [facility] services. Informed pt's family that he is not safe to be up alone and they understand.  Pt will benefit from skilled PT to increase their independence and safety with mobility to allow discharge to the venue listed below.       Follow Up Recommendations Home health PT;Supervision for mobility/OOB    Equipment Recommendations  None recommended by PT    Recommendations for Other Services       Precautions / Restrictions Precautions Precautions: Fall Precaution Comments: pt reports that he got dizzy and fell in the bathroom PTA, son reports inner ear issues at times Restrictions Weight Bearing Restrictions: No      Mobility  Bed Mobility Overal bed mobility: Needs Assistance Bed Mobility: Supine to Sit     Supine to sit: Mod assist     General bed mobility comments: mod HHA to elevate trunk into sitting, able to scoot hips to EOB once sitting  Transfers Overall transfer level: Needs assistance Equipment used: Rolling walker (2 wheeled) Transfers: Sit to/from Stand Sit to Stand: Mod assist;+2 physical assistance         General transfer comment: mod A +2 for  power up. Pt crosses ankles to scoot fwd and then uncrosses but barely (1 foot partially on other foot depite vc's) and feet sliding out from under him.    Ambulation/Gait Ambulation/Gait assistance: Min assist;+2 safety/equipment Gait Distance (Feet): 15 Feet Assistive device: Rolling walker (2 wheeled) Gait Pattern/deviations: Step-through pattern;Trunk flexed;Narrow base of support;Shuffle Gait velocity: decreased Gait velocity interpretation: <1.31 ft/sec, indicative of household ambulator General Gait Details: vc's for posture  Stairs            Wheelchair Mobility    Modified Rankin (Stroke Patients Only)       Balance Overall balance assessment: Needs assistance;History of Falls Sitting-balance support: No upper extremity supported Sitting balance-Leahy Scale: Fair     Standing balance support: Bilateral upper extremity supported Standing balance-Leahy Scale: Poor Standing balance comment: unable to stand safely without UE support                             Pertinent Vitals/Pain Pain Assessment: No/denies pain    Home Living Family/patient expects to be discharged to:: Private residence Living Arrangements: Spouse/significant other;Children Available Help at Discharge: Family;Available 24 hours/day Type of Home: House Home Access: Stairs to enter Entrance Stairs-Rails: Right;Left;Can reach both Entrance Stairs-Number of Steps: 4 Home Layout: One level Home Equipment: Walker - 2 wheels;Cane - single point;Bedside commode Additional Comments: pt's son lives with pr and wife but works in the day. However, he is planning to take  a week off when dad d/c to be there 24/7    Prior Function Level of Independence: Independent with assistive device(s)         Comments: ambulates with canes, drives and grocery shops     Hand Dominance        Extremity/Trunk Assessment   Upper Extremity Assessment Upper Extremity Assessment: Generalized  weakness;Defer to OT evaluation(decreased grip strength)    Lower Extremity Assessment Lower Extremity Assessment: Generalized weakness;RLE deficits/detail;LLE deficits/detail RLE Deficits / Details: hip flex 3/5, knee ext 3/5, wound on toe with skin hanging off, covered for mobility, swelling noted BLE's RLE Sensation: decreased light touch;decreased proprioception RLE Coordination: decreased gross motor LLE Deficits / Details: hip flex 3/5, knee ext 3/5 LLE Sensation: decreased light touch;decreased proprioception LLE Coordination: decreased fine motor;decreased gross motor    Cervical / Trunk Assessment Cervical / Trunk Assessment: Kyphotic  Communication   Communication: No difficulties  Cognition Arousal/Alertness: Awake/alert Behavior During Therapy: WFL for tasks assessed/performed Overall Cognitive Status: Within Functional Limits for tasks assessed                                        General Comments General comments (skin integrity, edema, etc.): BP before treatment 125/53, HR up to 111 bpm with ambulation and back down to 92 bpm after. BP 142/63 after ambulation. Pt mildly lightheaded with initial sitting and standing, reports that he takes his time with transitions.     Exercises     Assessment/Plan    PT Assessment Patient needs continued PT services  PT Problem List Decreased strength;Decreased activity tolerance;Decreased balance;Decreased mobility;Decreased coordination;Decreased knowledge of precautions;Decreased skin integrity;Impaired sensation       PT Treatment Interventions DME instruction;Gait training;Stair training;Functional mobility training;Therapeutic activities;Therapeutic exercise;Balance training;Patient/family education    PT Goals (Current goals can be found in the Care Plan section)  Acute Rehab PT Goals Patient Stated Goal: return home PT Goal Formulation: With patient/family Time For Goal Achievement:  05/16/18 Potential to Achieve Goals: Good    Frequency Min 3X/week   Barriers to discharge        Co-evaluation               AM-PAC PT "6 Clicks" Mobility  Outcome Measure Help needed turning from your back to your side while in a flat bed without using bedrails?: A Little Help needed moving from lying on your back to sitting on the side of a flat bed without using bedrails?: A Little Help needed moving to and from a bed to a chair (including a wheelchair)?: A Lot Help needed standing up from a chair using your arms (e.g., wheelchair or bedside chair)?: A Lot Help needed to walk in hospital room?: A Lot Help needed climbing 3-5 steps with a railing? : A Lot 6 Click Score: 14    End of Session Equipment Utilized During Treatment: Gait belt Activity Tolerance: Patient tolerated treatment well Patient left: in chair;with call bell/phone within reach;with chair alarm set;with family/visitor present Nurse Communication: Mobility status PT Visit Diagnosis: Unsteadiness on feet (R26.81);Muscle weakness (generalized) (M62.81);Repeated falls (R29.6);Difficulty in walking, not elsewhere classified (R26.2)    Time: 7096-2836 PT Time Calculation (min) (ACUTE ONLY): 34 min   Charges:   PT Evaluation $PT Eval Moderate Complexity: 1 Mod PT Treatments $Gait Training: 8-22 mins        Leighton Roach, [place]  Pager  [place] 05/02/2018, 10:57 AM

## 2018-05-03 ENCOUNTER — Encounter (HOSPITAL_COMMUNITY): Payer: Self-pay | Admitting: Gastroenterology

## 2018-05-03 LAB — TYPE AND SCREEN
ABO/RH(D): O POS
Antibody Screen: NEGATIVE
UNIT DIVISION: 0
Unit division: 0
Unit division: 0
Unit division: 0
Unit division: 0
Unit division: 0

## 2018-05-03 LAB — BASIC METABOLIC PANEL
Anion gap: 5 (ref 5–15)
BUN: 40 mg/dL — ABNORMAL HIGH (ref 8–23)
CO2: 18 mmol/L — ABNORMAL LOW (ref 22–32)
CREATININE: 2.24 mg/dL — AB (ref 0.61–1.24)
Calcium: 8.1 mg/dL — ABNORMAL LOW (ref 8.9–10.3)
Chloride: 116 mmol/L — ABNORMAL HIGH (ref 98–111)
GFR calc Af Amer: 30 mL/min — ABNORMAL LOW (ref >60)
GFR calc non Af Amer: 26 mL/min — ABNORMAL LOW (ref >60)
GLUCOSE: 115 mg/dL — AB (ref 70–99)
Potassium: 4.8 mmol/L (ref 3.5–5.1)
Sodium: 139 mmol/L (ref 135–145)

## 2018-05-03 LAB — CBC
HCT: 28.3 % — ABNORMAL LOW (ref 39.0–52.0)
Hemoglobin: 8.7 g/dL — ABNORMAL LOW (ref 13.0–17.0)
MCH: 30.6 pg (ref 26.0–34.0)
MCHC: 30.7 g/dL (ref 30.0–36.0)
MCV: 99.6 fL (ref 80.0–100.0)
Platelets: 81 10*3/uL — ABNORMAL LOW (ref 150–400)
RBC: 2.84 MIL/uL — ABNORMAL LOW (ref 4.22–5.81)
RDW: 17.6 % — ABNORMAL HIGH (ref 11.5–15.5)
WBC: 5.1 10*3/uL (ref 4.0–10.5)
nRBC: 0 % (ref 0.0–0.2)

## 2018-05-03 LAB — BPAM RBC
BLOOD PRODUCT EXPIRATION DATE: 202001292359
BLOOD PRODUCT EXPIRATION DATE: 202001292359
Blood Product Expiration Date: 202001292359
Blood Product Expiration Date: 202001292359
Blood Product Expiration Date: 202001292359
Blood Product Expiration Date: 202001292359
ISSUE DATE / TIME: 202001012151
ISSUE DATE / TIME: 202001020118
ISSUE DATE / TIME: 202001031140
Unit Type and Rh: 5100
Unit Type and Rh: 5100
Unit Type and Rh: 5100
Unit Type and Rh: 5100
Unit Type and Rh: 5100
Unit Type and Rh: 5100

## 2018-05-03 LAB — GLUCOSE, CAPILLARY: Glucose-Capillary: 105 mg/dL — ABNORMAL HIGH (ref 70–99)

## 2018-05-03 MED ORDER — PANTOPRAZOLE SODIUM 40 MG PO TBEC: 40.0000 mg | DELAYED_RELEASE_TABLET | Freq: Two times a day (BID) | ORAL | 0 refills | Status: DC

## 2018-05-03 MED ORDER — PANTOPRAZOLE SODIUM 40 MG PO TBEC: 40.0000 mg | DELAYED_RELEASE_TABLET | Freq: Two times a day (BID) | ORAL | 0 refills | Status: AC

## 2018-05-03 MED ORDER — FLUCONAZOLE 100 MG PO TABS: 100.0000 mg | ORAL_TABLET | Freq: Every day | ORAL | 0 refills | Status: DC

## 2018-05-03 NOTE — Discharge Summary (Signed)
Gary Ingram. WNI:[id number] DOB: [birth year] DOA: 04/29/2018  PCP: Marton Redwood, MD  Admit date: 04/29/2018  Discharge date: 05/03/2018  Admitted From: Home   Disposition:  Home   Recommendations for Outpatient Follow-up:   Follow up with PCP in 1-2 weeks  PCP Please obtain BMP/CBC, 2 view CXR in 1week,  (see Discharge instructions)   PCP Please follow up on the following pending results: Monitor Toe nail infection, CBC and BMP   Home Health: PT,TN   Equipment/Devices: has cane at hime Consultations: GI Discharge Condition: Stable   CODE STATUS: Full   Diet Recommendation: Heart Healthy Soft    Chief Complaint  Patient presents with  . GI Bleeding     Brief history of present illness from the day of admission and additional interim summary    85yo M w/ a hx of GIB due to colonic AVMs and esophageal varices, cryptogenic cirrhosis, HTN, colonic polyp, CKD stage III, DM2, HLD, TIA, and portal HTN who came to the hospital with shortness of breath developing over 2 weeks w/ melanotic stools.  In the ER he was found to have a Hgb of 5.8.  Endoscopy the following day revealed bleeding esophageal varices requiring banding.                                                                  Hospital Course   No new complaints today. Tolerating and enjoying his advanced diet. Denies any further obvious bleeding. No cp, sob, n/v, or abdom pain.   Assessment & Plan:  UGIB - esophageal variceal bleeding, EGD noted active variceal bleeding status post banding 04/30/18, on IV PPI and octreotide both stopped, transition to twice daily PPI, GI to taper down.  Clinically no further bleeding. Transitioned abx to oral and finished 5 days today.  Resume home dose beta-blocker, symptom-free will be discharged home with PCP  and GI outpatient follow-up and monitoring of his hemoglobin and hematocrit.   Acute blood loss anemia - S/p 3U PRBC thus far - IV Fe infused - Hgb appears stable at this time -, symptom-free and eager to go home, will be discharged home with PCP to monitor his CBC and BMP.Hyperkalemia Resolved   DM2 - CBGs well controlled, continue home regimen on Glucophage.  Due to his age PCP can consider switching off Glucophage.  Tinea pedis w/ callous - Continue Diflucan - WOC has seen, PCP to monitor.  Acute kidney injury on Chronic kidney disease stage IV -baseline creatinine close to 2.2, close to baseline now, home dose diuretic and ACE inhibitor resumed, PCP to monitor.     Discharge diagnosis     Principal Problem:   Symptomatic anemia Active Problems:   Esophageal varices (HCC)   AVM (arteriovenous malformation) of colon without hemorrhage   IDA (iron  deficiency anemia)   DM2 (diabetes mellitus, type 2) (HCC)   Hyperlipidemia   Cirrhosis of liver without ascites [facility])    Discharge instructions    Discharge Instructions    Discharge instructions   Complete by:  As directed    Follow with Primary MD Marton Redwood, MD in 7 days   Get CBC, CMP checked  by Primary MD  in 5-7 days   Activity: As tolerated with Full fall precautions use walker/cane & assistance as needed  Disposition Home    Diet: Heart Healthy  - Soft  For Heart failure patients - Check your Weight same time everyday, if you gain over 2 pounds, or you develop in leg swelling, experience more shortness of breath or chest pain, call your Primary MD immediately. Follow Cardiac Low Salt Diet and 1.5 lit/day fluid restriction.  Special Instructions: If you have smoked or chewed Tobacco  in the last 2 yrs please stop smoking, stop any regular Alcohol  and or any Recreational drug use.  On your next visit with your primary care physician please Get Medicines reviewed and adjusted.  Please request your Prim.MD to  go over all Hospital Tests and Procedure/Radiological results at the follow up, please get all Hospital records sent to your Prim MD by signing hospital release before you go home.  If you experience worsening of your admission symptoms, develop shortness of breath, life threatening emergency, suicidal or homicidal thoughts you must seek medical attention immediately by calling 911 or calling your MD immediately  if symptoms less severe.  You Must read complete instructions/literature along with all the possible adverse reactions/side effects for all the Medicines you take and that have been prescribed to you. Take any new Medicines after you have completely understood and accpet all the possible adverse reactions/side effects.   Increase activity slowly   Complete by:  As directed       Discharge Medications   Allergies as of 05/03/2018   No Known Allergies     Medication List    TAKE these medications   CENTRUM ADULTS Tabs Take by mouth every morning.   PRESERVISION AREDS 2 PO Take 1 tablet by mouth daily.   fenofibrate 160 MG tablet Take 160 mg by mouth daily.   fluconazole 100 MG tablet Commonly known as:  DIFLUCAN Take 1 tablet (100 mg total) by mouth daily. Start taking on:  May 04, 2018   losartan-hydrochlorothiazide 100-25 MG tablet Commonly known as:  HYZAAR Take 1 tablet by mouth daily.   metFORMIN 1000 MG tablet Commonly known as:  GLUCOPHAGE Take 1,000 mg by mouth 2 (two) times daily with a meal.   metoprolol tartrate 50 MG tablet Commonly known as:  LOPRESSOR 50 mg 2 (two) times daily.   ONE TOUCH ULTRA TEST test strip Generic drug:  glucose blood every morning.   pantoprazole 40 MG tablet Commonly known as:  PROTONIX Take 1 tablet (40 mg total) by mouth 2 (two) times daily.   POLY-IRON 150 150 MG capsule Generic drug:  iron polysaccharides Take 150 mg by mouth daily.   simvastatin 20 MG tablet Commonly known as:  ZOCOR Take 20 mg by mouth  daily at 6 PM.   traMADol 50 MG tablet Commonly known as:  ULTRAM Take 1 tablet (50 mg total) by mouth every 6 (six) hours as needed.       Follow-up Information    Marton Redwood, MD. Schedule an appointment as soon as possible for a visit in 1  week(s).   Specialty:  Internal Medicine Contact information: [place] [address] [place], [place], [place]. Schedule an appointment as soon as possible for a visit in 1 week(s).   Specialty:  Gastroenterology Contact information: [place] [address] [phone]           Major procedures and Radiology Reports - PLEASE review detailed and final reports thoroughly  -     1/2 EGD w/ banding of varices     No results found.  Micro Results     Recent Results (from the past 240 hour(s))  MRSA PCR Screening     Status: None   Collection Time: 04/30/18  3:21 AM  Result Value Ref Range Status   MRSA by PCR NEGATIVE NEGATIVE Final    Comment:        The GeneXpert MRSA Assay (FDA approved for NASAL specimens only), is one component of a comprehensive MRSA colonization surveillance program. It is not intended to diagnose MRSA infection nor to guide or monitor treatment for MRSA infections. Performed at [facility], [place] [address] [place], [place] [address]     Today   Subjective    Gary Ingram today has no headache,no chest abdominal pain,no new weakness tingling or numbness, feels much better wants to go home today.     Objective   Blood pressure (!) 125/54, pulse 77, temperature 97.7 F (36.5 C), temperature source Oral, resp. rate 19, height 5\' 1"  (1.549 m), weight 90.7 kg, SpO2 97 %.   Intake/Output Summary (Last 24 hours) at 05/03/2018 0939 Last data filed at 05/02/2018 1847 Gross per 24 hour  Intake 1246.77 ml  Output 150 ml  Net 1096.77 ml    Exam  Awake Alert, Oriented x 3, No new F.N deficits, Normal  affect [place].AT,PERRAL Supple Neck,No JVD, No cervical lymphadenopathy appriciated.  Symmetrical Chest wall movement, Good air movement bilaterally, CTAB RRR,No Gallops,Rubs or new Murmurs, No Parasternal Heave +ve B.Sounds, Abd Soft, Non tender, No organomegaly appriciated, No rebound -guarding or rigidity. No Cyanosis, Clubbing or edema, No new Rash or bruise   Data Review   CBC w Diff:  Lab Results  Component Value Date   WBC 5.1 05/03/2018   HGB 8.7 (L) 05/03/2018   HGB 12.2 (L) 09/15/2015   HGB 12.3 (L) 11/18/2008   HCT 28.3 (L) 05/03/2018   HCT 35.1 (L) 09/15/2015   HCT 37.7 (L) 11/18/2008   PLT 81 (L) 05/03/2018   PLT 78 Platelet count consistent in citrate (L) 09/15/2015   PLT 109 (L) 11/18/2008   LYMPHOPCT 13.8 (L) 09/15/2015   LYMPHOPCT 17.0 11/18/2008   MONOPCT 8.4 09/15/2015   MONOPCT 6.6 11/18/2008   EOSPCT 2.8 09/15/2015   EOSPCT 1.8 11/18/2008   BASOPCT 0.2 09/15/2015   BASOPCT 0.3 11/18/2008    CMP:  Lab Results  Component Value Date   NA 139 05/03/2018   NA 140 09/15/2015   K 4.8 05/03/2018   K 4.5 09/15/2015   CL 116 (H) 05/03/2018   CL 106 06/04/2013   CO2 18 (L) 05/03/2018   CO2 22 09/15/2015   BUN 40 (H) 05/03/2018   BUN 34.8 (H) 09/15/2015   CREATININE 2.24 (H) 05/03/2018   CREATININE 1.9 (H) 09/15/2015   PROT 5.1 (L) 05/02/2018   PROT 7.4 09/15/2015   ALBUMIN 2.4 (L) 05/02/2018   ALBUMIN 4.2 09/15/2015   BILITOT 0.4 05/02/2018  BILITOT 0.60 09/15/2015   ALKPHOS 38 05/02/2018   ALKPHOS 37 (L) 09/15/2015   AST 107 (H) 05/02/2018   AST 34 09/15/2015   ALT 37 05/02/2018   ALT 17 09/15/2015  .   Total Time in preparing paper work, data evaluation and todays exam - 27 minutes  Lala Lund M.D on 05/03/2018 at 9:39 AM  [facility]   Office  [phone]

## 2018-05-03 NOTE — Progress Notes (Signed)
Discharge teaching complete. Meds, diet, activity, follow up appointments reviewed and all questions answered. Copy of instructions and prescriptions given to patient. Patient discharged home via wheelchair with son and wife.

## 2018-05-03 NOTE — Evaluation (Signed)
Occupational Therapy Evaluation and Discharge Patient Details Name: Gary Ingram. MRN: [id number] DOB: 05-22-1932 Today's Date: 05/03/2018    History of Present Illness 85yo M w/ a hx of GIB due to colonic AVMs and esophageal varices, cryptogenic cirrhosis, HTN, colonic polyp, CKD stage III, DM2, HLD, TIA, and portal HTN who came to the hospital with shortness of breath developing over 2 weeks w/ melanotic stools. Underwent EGD with bleeding varices and banding on 05/01/18.   Clinical Impression   This 85 yo male admitted with above presents to acute OT with increased need for A over baseline for mobility and self care. I have discussed the patient's current level of function related to ADLs with the patient and son and wife.  They acknowledge understanding of this and feel they can provide the level of care the patient will need at home. Pt will benefit from [facility] and I have recommended this. HHPT was recommended on their eval yesterday, since pt is D/C'ing HHOT may have not been set up, but HHPT can recommend this if they feel necessary once they see him.       Follow Up Recommendations  Home health OT;Supervision/Assistance - 24 hour    Equipment Recommendations  None recommended by OT       Precautions / Restrictions Precautions Precautions: Fall Precaution Comments: pt reports that he got dizzy and fell in the bathroom PTA, son reports pt has intermittent vertigo Restrictions Weight Bearing Restrictions: No      Mobility Bed Mobility Overal bed mobility: Needs Assistance Bed Mobility: Supine to Sit     Supine to sit: Min assist        Transfers Overall transfer level: Needs assistance Equipment used: Rolling walker (2 wheeled) Transfers: Sit to/from Stand Sit to Stand: Min assist         General transfer comment: min A sit<>stand with increased time with tendency for posterior lean (gave pt a gait belt to take home with him)    Balance Overall balance  assessment: Needs assistance;History of Falls Sitting-balance support: No upper extremity supported Sitting balance-Leahy Scale: Good     Standing balance support: Bilateral upper extremity supported Standing balance-Leahy Scale: Poor Standing balance comment: initally needs A of RW and additional external support, then only A of RW and minguard A                            ADL either performed or assessed with clinical judgement   ADL Overall ADL's : Needs assistance/impaired Eating/Feeding: Independent;Sitting Eating/Feeding Details (indicate cue type and reason): EOB Grooming: Set up;Sitting Grooming Details (indicate cue type and reason): EOB Upper Body Bathing: Set up;Sitting Upper Body Bathing Details (indicate cue type and reason): EOB Lower Body Bathing: Minimal assistance Lower Body Bathing Details (indicate cue type and reason): min A sit<>stand with increased time Upper Body Dressing : Set up;Sitting Upper Body Dressing Details (indicate cue type and reason): EOB Lower Body Dressing: Moderate assistance Lower Body Dressing Details (indicate cue type and reason): min A sit<>stand with increased time Toilet Transfer: Minimal assistance;Ambulation;RW Toilet Transfer Details (indicate cue type and reason): bed>out into hallway 15 feet>back to room 15 feet>sit on bed Toileting- Clothing Manipulation and Hygiene: Moderate assistance Toileting - Clothing Manipulation Details (indicate cue type and reason): min A sit<>stand with increased time   Tub/Shower Transfer Details (indicate cue type and reason): pt sponge baths with A of wife prn  Vision Patient Visual Report: No change from baseline              Pertinent Vitals/Pain Pain Assessment: No/denies pain     Hand Dominance Right   Extremity/Trunk Assessment Upper Extremity Assessment Upper Extremity Assessment: Overall WFL for tasks assessed           Communication  Communication Communication: No difficulties   Cognition Arousal/Alertness: Awake/alert Behavior During Therapy: WFL for tasks assessed/performed Overall Cognitive Status: Within Functional Limits for tasks assessed                                                Home Living Family/patient expects to be discharged to:: Private residence Living Arrangements: Spouse/significant other;Children Available Help at Discharge: Family;Available 24 hours/day Type of Home: House Home Access: Stairs to enter [employer] of Steps: 4 Entrance Stairs-Rails: Right;Left;Can reach both Home Layout: One level     Bathroom Shower/Tub: (takes sponge baths)   Bathroom Toilet: Standard     Home Equipment: Walker - 2 wheels;Cane - single point;Bedside commode   Additional Comments: pt's son lives with pr and wife but works in the day. However, son is planning to take a week off when dad d/c to be there 24/7; son is aware he need to be with his dad anytime he is up on his feet      Prior Functioning/Environment Level of Independence: Needs assistance  Gait / Transfers Assistance Needed: Independent with assisitve devices ADL's / Homemaking Assistance Needed: Wife A him prn   Comments: ambulates with canes, drives and grocery shops        OT Problem List: Decreased strength;Decreased range of motion;Impaired balance (sitting and/or standing)         OT Goals(Current goals can be found in the care plan section) Acute Rehab OT Goals Patient Stated Goal: go home today  OT Frequency:                AM-PAC OT "6 Clicks" Daily Activity     Outcome Measure Help from another person eating meals?: None Help from another person taking care of personal grooming?: A Little Help from another person toileting, which includes using toliet, bedpan, or urinal?: A Lot Help from another person bathing (including washing, rinsing, drying)?: A Little Help from another person  to put on and taking off regular upper body clothing?: A Little Help from another person to put on and taking off regular lower body clothing?: A Lot 6 Click Score: 17   End of Session Equipment Utilized During Treatment: Gait belt;Rolling walker  Activity Tolerance: Patient tolerated treatment well Patient left: (sitting EOB with RN going over DC instructions)  OT Visit Diagnosis: Unsteadiness on feet (R26.81);Repeated falls (R29.6)                Time: 2694-8546 OT Time Calculation (min): 42 min Charges:  OT General Charges $OT Visit: 1 Visit OT Evaluation $OT Eval Moderate Complexity: 1 Mod OT Treatments $Self Care/Home Management : 23-37 mins  [place], OTR/L Acute [employer] Pager [phone] Office [phone]     Almon Register 05/03/2018, 11:16 AM

## 2018-05-03 NOTE — Discharge Instructions (Signed)
Follow with Primary MD Marton Redwood, MD in 7 days   Get CBC, CMP checked  by Primary MD  in 5-7 days   Activity: As tolerated with Full fall precautions use walker/cane & assistance as needed  Disposition Home    Diet: Heart Healthy  - Soft  For Heart failure patients - Check your Weight same time everyday, if you gain over 2 pounds, or you develop in leg swelling, experience more shortness of breath or chest pain, call your Primary MD immediately. Follow Cardiac Low Salt Diet and 1.5 lit/day fluid restriction.  Special Instructions: If you have smoked or chewed Tobacco  in the last 2 yrs please stop smoking, stop any regular Alcohol  and or any Recreational drug use.  On your next visit with your primary care physician please Get Medicines reviewed and adjusted.  Please request your Prim.MD to go over all Hospital Tests and Procedure/Radiological results at the follow up, please get all Hospital records sent to your Prim MD by signing hospital release before you go home.  If you experience worsening of your admission symptoms, develop shortness of breath, life threatening emergency, suicidal or homicidal thoughts you must seek medical attention immediately by calling 911 or calling your MD immediately  if symptoms less severe.  You Must read complete instructions/literature along with all the possible adverse reactions/side effects for all the Medicines you take and that have been prescribed to you. Take any new Medicines after you have completely understood and accpet all the possible adverse reactions/side effects.

## 2018-05-03 NOTE — Care Management Note (Signed)
Case Management Note  Patient Details  Name: Taite Schoeppner. MRN: [id number] Date of Birth: [birth year]  Subjective/Objective:                 Pt to return home with wife and assistance of son and other family members.  Son at [facility] states they have all necessary DME.  Wife has use [employer] in the past and would like to use again.   Action/Plan: Referral called to [facility] with [facility]    Expected Discharge Date:  05/03/18               Expected Discharge Plan:  [place]  In-House Referral:  NA  Discharge planning Services  CM Consult  Post Acute Care Choice:  Home Health Choice offered to:  Adult Children  DME Arranged:  N/A(pt has all DME needed) DME Agency:     HH Arranged:  RN, PT, Nurse's Aide [place] Agency:  [place]  Status of Service:  Completed, signed off  If discussed at [place] of Stay Meetings, dates discussed:    Additional Comments:  Claudie Leach, RN 05/03/2018, 10:39 AM

## 2018-05-15 ENCOUNTER — Encounter: Payer: Self-pay | Admitting: Podiatry

## 2018-05-15 ENCOUNTER — Ambulatory Visit: Payer: Medicare Other | Admitting: Podiatry

## 2018-05-15 ENCOUNTER — Ambulatory Visit: Payer: Medicare Other

## 2018-05-15 VITALS — BP 121/64 | HR 50 | Resp 16

## 2018-05-15 DIAGNOSIS — L97511 Non-pressure chronic ulcer of other part of right foot limited to breakdown of skin: Secondary | ICD-10-CM

## 2018-05-15 DIAGNOSIS — I872 Venous insufficiency (chronic) (peripheral): Secondary | ICD-10-CM | POA: Diagnosis not present

## 2018-05-15 DIAGNOSIS — L97828 Non-pressure chronic ulcer of other part of left lower leg with other specified severity: Secondary | ICD-10-CM

## 2018-05-17 NOTE — Progress Notes (Signed)
Subjective:  Patient ID: Gary Ingram., male    DOB: 1932-05-14,  MRN: [id number]  Chief Complaint  Patient presents with  . Toe Pain    Plantar hallux right - wound for at least 3 weeks, son is unsure how long wound has been there, started as thick callus, "doc cut it", bleeds occasionally, PC rx'd doxy and mupirocin and covering with bandaid  . New Patient (Initial Visit)    85 y.o. male presents for wound care.  History as above for the callus of the right foot.  States that he has pain at the area.  Also complains of a draining sore to the left leg but he does not show this to [place] today  Review of Systems: Negative except as noted in the HPI. Denies N/V/F/Ch.  Past Medical History:  Diagnosis Date  . Adenomatous colon polyp   . Anemia   . AVM (arteriovenous malformation) of colon   . Chronic kidney disease, stage III (moderate) (HCC)   . Diabetes ([place])   . Dyslipidemia   . Esophageal varices ([place])   . GI bleed   . Helicobacter pylori gastritis   . Hypertension   . Iron deficiency anemia   . Obesity   . Peripheral neuropathy   . Portal hypertension ([place])   . Splenomegaly   . Thrombocytopenia ([place])   . Thrombocytopenia ([place])   . TIA (transient ischemic attack)    ?  Marland Kitchen Vertigo   . Vertigo     Current Outpatient Medications:  .  doxycycline (VIBRA-TABS) 100 MG tablet, TK 1 T PO BID FOR 10 DAYS, Disp: , Rfl:  .  fenofibrate 160 MG tablet, Take 160 mg by mouth daily., Disp: , Rfl:  .  fluconazole (DIFLUCAN) 100 MG tablet, Take 1 tablet (100 mg total) by mouth daily., Disp: 10 tablet, Rfl: 0 .  furosemide (LASIX) 40 MG tablet, , Disp: , Rfl:  .  losartan-hydrochlorothiazide (HYZAAR) 100-25 MG tablet, Take 1 tablet by mouth daily. , Disp: , Rfl:  .  metFORMIN (GLUCOPHAGE) 1000 MG tablet, Take 1,000 mg by mouth 2 (two) times daily with a meal. , Disp: , Rfl:  .  metoprolol (LOPRESSOR) 50 MG tablet, 50 mg 2 (two) times daily. , Disp: , Rfl:  .  Multiple Vitamins-Minerals  (CENTRUM ADULTS) TABS, Take by mouth every morning., Disp: , Rfl:  .  Multiple Vitamins-Minerals (PRESERVISION AREDS 2 PO), Take 1 tablet by mouth daily., Disp: , Rfl:  .  mupirocin ointment (BACTROBAN) 2 %, APP TO RIGHT GREAT TOE BID AND COVER WITH BANDAGE, Disp: , Rfl:  .  ondansetron (ZOFRAN-ODT) 8 MG disintegrating tablet, DIS 1 T ON THE TONGUE Q 8 H PRF NAUSEA, Disp: , Rfl:  .  ONE TOUCH ULTRA TEST test strip, every morning., Disp: , Rfl:  .  pantoprazole (PROTONIX) 40 MG tablet, Take 1 tablet (40 mg total) by mouth 2 (two) times daily., Disp: 60 tablet, Rfl: 0 .  POLY-IRON 150 150 MG capsule, Take 150 mg by mouth daily. , Disp: , Rfl: 11 .  simvastatin (ZOCOR) 20 MG tablet, Take 20 mg by mouth daily at 6 PM. , Disp: , Rfl: 11 .  traMADol (ULTRAM) 50 MG tablet, Take 1 tablet (50 mg total) by mouth every 6 (six) hours as needed. (Patient not taking: Reported on 04/29/2018), Disp: 60 tablet, Rfl: 1 .  triamcinolone cream (KENALOG) 0.1 %, APP TO LEGS BID FOR REDNESS, Disp: , Rfl:   Social History   Tobacco  Use  Smoking Status Former Smoker  . Last attempt to quit: 06/10/1952  . Years since quitting: 65.9  Smokeless Tobacco Never Used  Tobacco Comment   never used tobacco    No Known Allergies Objective:   Vitals:   05/15/18 1022  BP: 121/64  Pulse: (!) 50  Resp: 16   There is no height or weight on file to calculate BMI. Constitutional Well developed. Well nourished.  Vascular Dorsalis pedis pulses palpable bilaterally. Posterior tibial pulses palpable bilaterally. Capillary refill normal to all digits.  No cyanosis or clubbing noted. Pedal hair growth normal.  Neurologic Normal speech. Oriented to person, place, and time. Protective sensation absent  Dermatologic Wound Location: Right hallux plantarly Wound Base: Granular/Healthy Peri-wound: Calloused Exudate: None: wound tissue dry Wound Measurements: -0.5 x 0.5 post debridement  Orthopedic: No pain to palpation  either foot.   Radiographs: Unable to x-rays today Assessment:   1. Skin ulcer of right great toe, limited to breakdown of skin (HCC)   2. Venous stasis ulcer of other part of left lower leg with other ulcer severity without varicose veins ([place])    Plan:  Patient was evaluated and treated and all questions answered.  Ulcer right hallux -Debridement as below. -Dressed with Silvadene, DSD. -Continue off-loading with surgical shoe.  Procedure: Excisional Debridement of Wound Rationale: Removal of non-viable soft tissue from the wound to promote healing.  Anesthesia: none Pre-Debridement Wound Measurements: overlying hyperkeratosis Post-Debridement Wound Measurements: 0.5 cm x 0.5 cm x 0.1 cm  Type of Debridement: Sharp Excisional Tissue Removed: Non-viable soft tissue Depth of Debridement: subcutaneous tissue. Technique: Sharp excisional debridement to bleeding, viable wound base.  Dressing: Dry, sterile, compression dressing. Disposition: Patient tolerated procedure well. Patient to return in 1 week for follow-up.  Left leg venous leg ulceration -Unable to assess today -Would benefit from referral to wound care center for comprehensive care  No follow-ups on file.

## 2018-05-18 ENCOUNTER — Telehealth: Payer: Self-pay | Admitting: *Deleted

## 2018-05-18 DIAGNOSIS — L97511 Non-pressure chronic ulcer of other part of right foot limited to breakdown of skin: Secondary | ICD-10-CM

## 2018-05-18 DIAGNOSIS — I872 Venous insufficiency (chronic) (peripheral): Secondary | ICD-10-CM

## 2018-05-18 DIAGNOSIS — L97828 Non-pressure chronic ulcer of other part of left lower leg with other specified severity: Secondary | ICD-10-CM

## 2018-05-18 NOTE — Telephone Encounter (Signed)
faxed required form, clinicals and demographics to [facility].

## 2018-05-18 NOTE — Telephone Encounter (Signed)
-----   Message from Evelina Bucy, DPM sent at 05/17/2018  3:42 PM EST ----- Can we refer him to the wound care center please?

## 2018-05-19 ENCOUNTER — Emergency Department (HOSPITAL_COMMUNITY): Payer: Medicare Other

## 2018-05-19 ENCOUNTER — Other Ambulatory Visit: Payer: Self-pay

## 2018-05-19 ENCOUNTER — Inpatient Hospital Stay (HOSPITAL_COMMUNITY)
Admission: EM | Admit: 2018-05-19 | Discharge: 2018-05-28 | DRG: 432 | Disposition: A | Discharge status: Skilled Nursing Facility | Payer: Medicare Other | Attending: Internal Medicine | Admitting: Internal Medicine

## 2018-05-19 DIAGNOSIS — Z7984 Long term (current) use of oral hypoglycemic drugs: Secondary | ICD-10-CM

## 2018-05-19 DIAGNOSIS — E119 Type 2 diabetes mellitus without complications: Secondary | ICD-10-CM

## 2018-05-19 DIAGNOSIS — R600 Localized edema: Secondary | ICD-10-CM

## 2018-05-19 DIAGNOSIS — E43 Unspecified severe protein-calorie malnutrition: Secondary | ICD-10-CM | POA: Diagnosis present

## 2018-05-19 DIAGNOSIS — E1142 Type 2 diabetes mellitus with diabetic polyneuropathy: Secondary | ICD-10-CM | POA: Diagnosis present

## 2018-05-19 DIAGNOSIS — K7460 Unspecified cirrhosis of liver: Secondary | ICD-10-CM | POA: Diagnosis present

## 2018-05-19 DIAGNOSIS — N184 Chronic kidney disease, stage 4 (severe): Secondary | ICD-10-CM | POA: Diagnosis present

## 2018-05-19 DIAGNOSIS — K7469 Other cirrhosis of liver: Secondary | ICD-10-CM | POA: Diagnosis not present

## 2018-05-19 DIAGNOSIS — Z87891 Personal history of nicotine dependence: Secondary | ICD-10-CM

## 2018-05-19 DIAGNOSIS — I8500 Esophageal varices without bleeding: Secondary | ICD-10-CM | POA: Diagnosis present

## 2018-05-19 DIAGNOSIS — D61818 Other pancytopenia: Secondary | ICD-10-CM | POA: Diagnosis present

## 2018-05-19 DIAGNOSIS — D500 Iron deficiency anemia secondary to blood loss (chronic): Secondary | ICD-10-CM | POA: Diagnosis present

## 2018-05-19 DIAGNOSIS — E875 Hyperkalemia: Secondary | ICD-10-CM | POA: Diagnosis present

## 2018-05-19 DIAGNOSIS — R161 Splenomegaly, not elsewhere classified: Secondary | ICD-10-CM | POA: Diagnosis present

## 2018-05-19 DIAGNOSIS — I130 Hypertensive heart and chronic kidney disease with heart failure and stage 1 through stage 4 chronic kidney disease, or unspecified chronic kidney disease: Secondary | ICD-10-CM | POA: Diagnosis present

## 2018-05-19 DIAGNOSIS — I8510 Secondary esophageal varices without bleeding: Secondary | ICD-10-CM | POA: Diagnosis present

## 2018-05-19 DIAGNOSIS — E1122 Type 2 diabetes mellitus with diabetic chronic kidney disease: Secondary | ICD-10-CM | POA: Diagnosis present

## 2018-05-19 DIAGNOSIS — E871 Hypo-osmolality and hyponatremia: Secondary | ICD-10-CM | POA: Diagnosis present

## 2018-05-19 DIAGNOSIS — Z9181 History of falling: Secondary | ICD-10-CM

## 2018-05-19 DIAGNOSIS — Z515 Encounter for palliative care: Secondary | ICD-10-CM | POA: Diagnosis not present

## 2018-05-19 DIAGNOSIS — N179 Acute kidney failure, unspecified: Secondary | ICD-10-CM | POA: Diagnosis present

## 2018-05-19 DIAGNOSIS — R627 Adult failure to thrive: Secondary | ICD-10-CM | POA: Diagnosis present

## 2018-05-19 DIAGNOSIS — Z7189 Other specified counseling: Secondary | ICD-10-CM

## 2018-05-19 DIAGNOSIS — E11649 Type 2 diabetes mellitus with hypoglycemia without coma: Secondary | ICD-10-CM | POA: Diagnosis present

## 2018-05-19 DIAGNOSIS — Z79899 Other long term (current) drug therapy: Secondary | ICD-10-CM

## 2018-05-19 DIAGNOSIS — E785 Hyperlipidemia, unspecified: Secondary | ICD-10-CM | POA: Diagnosis present

## 2018-05-19 DIAGNOSIS — R1312 Dysphagia, oropharyngeal phase: Secondary | ICD-10-CM | POA: Diagnosis present

## 2018-05-19 DIAGNOSIS — Z6831 Body mass index (BMI) 31.0-31.9, adult: Secondary | ICD-10-CM

## 2018-05-19 DIAGNOSIS — I509 Heart failure, unspecified: Secondary | ICD-10-CM | POA: Diagnosis present

## 2018-05-19 DIAGNOSIS — E039 Hypothyroidism, unspecified: Secondary | ICD-10-CM | POA: Diagnosis present

## 2018-05-19 DIAGNOSIS — K766 Portal hypertension: Secondary | ICD-10-CM | POA: Diagnosis present

## 2018-05-19 DIAGNOSIS — Z833 Family history of diabetes mellitus: Secondary | ICD-10-CM

## 2018-05-19 DIAGNOSIS — D638 Anemia in other chronic diseases classified elsewhere: Secondary | ICD-10-CM | POA: Diagnosis present

## 2018-05-19 DIAGNOSIS — R601 Generalized edema: Secondary | ICD-10-CM | POA: Diagnosis present

## 2018-05-19 DIAGNOSIS — Z66 Do not resuscitate: Secondary | ICD-10-CM | POA: Diagnosis not present

## 2018-05-19 DIAGNOSIS — E669 Obesity, unspecified: Secondary | ICD-10-CM | POA: Diagnosis present

## 2018-05-19 LAB — TYPE AND SCREEN
ABO/RH(D): O POS
Antibody Screen: NEGATIVE

## 2018-05-19 LAB — COMPREHENSIVE METABOLIC PANEL
ALT: 26 U/L (ref 0–44)
AST: 64 U/L — ABNORMAL HIGH (ref 15–41)
Albumin: 2.5 g/dL — ABNORMAL LOW (ref 3.5–5.0)
Alkaline Phosphatase: 52 U/L (ref 38–126)
Anion gap: 8 (ref 5–15)
BILIRUBIN TOTAL: 0.6 mg/dL (ref 0.3–1.2)
BUN: 42 mg/dL — ABNORMAL HIGH (ref 8–23)
CO2: 17 mmol/L — ABNORMAL LOW (ref 22–32)
Calcium: 8.6 mg/dL — ABNORMAL LOW (ref 8.9–10.3)
Chloride: 107 mmol/L (ref 98–111)
Creatinine, Ser: 3.44 mg/dL — ABNORMAL HIGH (ref 0.61–1.24)
GFR calc Af Amer: 18 mL/min — ABNORMAL LOW (ref >60)
GFR calc non Af Amer: 16 mL/min — ABNORMAL LOW (ref >60)
GLUCOSE: 79 mg/dL (ref 70–99)
Potassium: 5.2 mmol/L — ABNORMAL HIGH (ref 3.5–5.1)
Sodium: 132 mmol/L — ABNORMAL LOW (ref 135–145)
Total Protein: 5.2 g/dL — ABNORMAL LOW (ref 6.5–8.1)

## 2018-05-19 LAB — PROTIME-INR
INR: 1.48
Prothrombin Time: 17.8 seconds — ABNORMAL HIGH (ref 11.4–15.2)

## 2018-05-19 LAB — CBC
HEMATOCRIT: 29.2 % — AB (ref 39.0–52.0)
HEMOGLOBIN: 9.3 g/dL — AB (ref 13.0–17.0)
MCH: 30.6 pg (ref 26.0–34.0)
MCHC: 31.8 g/dL (ref 30.0–36.0)
MCV: 96.1 fL (ref 80.0–100.0)
Platelets: 52 10*3/uL — ABNORMAL LOW (ref 150–400)
RBC: 3.04 MIL/uL — ABNORMAL LOW (ref 4.22–5.81)
RDW: 19.3 % — AB (ref 11.5–15.5)
WBC: 4.8 10*3/uL (ref 4.0–10.5)
nRBC: 0 % (ref 0.0–0.2)

## 2018-05-19 MED ORDER — OXYMETAZOLINE HCL 0.05 % NA SOLN
1.0000 | Freq: Once | NASAL | Status: DC
  Filled 2018-05-19: qty 15

## 2018-05-19 NOTE — ED Notes (Signed)
Patient transported to X-ray 

## 2018-05-19 NOTE — ED Notes (Signed)
RN called lab, will add on BNP

## 2018-05-19 NOTE — ED Triage Notes (Addendum)
Pt coming from home where he lives and is seen by home health nurses. Per EMS, pt had mechanical fall this morning with some dizziness after. Pt denies loss of consciousness, current dizziness, or pain. EMS denies thinners. Pt has hx of alzheimer's, but is oriented x4 at this time. Pt has CHF and pitting edema to legs and crackles per EMS. Pt denies SOB. Skin tear present to left elbow.

## 2018-05-19 NOTE — ED Provider Notes (Signed)
[facility] EMERGENCY DEPARTMENT Provider Note   CSN: [id number] Arrival date & time: 05/19/18  2040     History   Chief Complaint Chief Complaint  Patient presents with  . Fall    HPI Gary Ingram. is a 85 y.o. male.  HPI Patient is an 85 year old male with a history of esophageal varices, cirrhosis, recent GI bleed requiring several units of blood transfusion.  He was hospitalized in the first part of January 2020.  He has been at home.  Home health was set up.  He had a fall today where he became lightheaded and dizzy.  He was able to get up with assistance from family.  Home health nurse present and found the patient to be hypertensive complaining of cough shortness of breath and worsening lower extremity edema and was encouraged that he come to the emergency department for evaluation.  Patient denies shortness of breath at this time.  He does admit to worsening swelling of his lower extremities.  He thinks that he may have had vertigo as a cause of his fall.  He has had some generalized weakness.  No other complaints at this time.   Past Medical History:  Diagnosis Date  . Adenomatous colon polyp   . Anemia   . AVM (arteriovenous malformation) of colon   . Chronic kidney disease, stage III (moderate) (HCC)   . Diabetes ([place])   . Dyslipidemia   . Esophageal varices ([place])   . GI bleed   . Helicobacter pylori gastritis   . Hypertension   . Iron deficiency anemia   . Obesity   . Peripheral neuropathy   . Portal hypertension ([place])   . Splenomegaly   . Thrombocytopenia ([place])   . Thrombocytopenia ([place])   . TIA (transient ischemic attack)    ?  Marland Kitchen Vertigo   . Vertigo     Patient Active Problem List   Diagnosis Date Noted  . Cirrhosis of liver without ascites ([place])   . Symptomatic anemia 04/29/2018  . DM2 (diabetes mellitus, type 2) ([place]) 04/29/2018  . Hyperlipidemia 04/29/2018  . IDA (iron deficiency anemia) 09/14/2015  . Esophageal varices  ([place]) 06/10/2013  . AVM (arteriovenous malformation) of colon without hemorrhage 06/10/2013  . Benign neoplasm of colon 06/10/2013  . Thrombocytopenia ([place]) 05/31/2011    Past Surgical History:  Procedure Laterality Date  . COLONOSCOPY N/A 06/10/2013   Procedure: COLONOSCOPY;  Surgeon: Lafayette Dragon, MD;  Location: [facility];  Service: Endoscopy;  Laterality: N/A;  . ESOPHAGEAL BANDING  04/30/2018   Procedure: ESOPHAGEAL BANDING;  Surgeon: Lavena Bullion, DO;  Location: [facility];  Service: Gastroenterology;;  . ESOPHAGOGASTRODUODENOSCOPY N/A 06/10/2013   Procedure: ESOPHAGOGASTRODUODENOSCOPY (EGD);  Surgeon: Lafayette Dragon, MD;  Location: [facility];  Service: Endoscopy;  Laterality: N/A;  . ESOPHAGOGASTRODUODENOSCOPY (EGD) WITH PROPOFOL N/A 04/30/2018   Procedure: ESOPHAGOGASTRODUODENOSCOPY (EGD) WITH PROPOFOL;  Surgeon: Lavena Bullion, DO;  Location: [facility];  Service: Gastroenterology;  Laterality: N/A;  . TONSILLECTOMY AND ADENOIDECTOMY          Home Medications    Prior to Admission medications   Medication Sig Start Date End Date Taking? Authorizing Provider  doxycycline (VIBRA-TABS) 100 MG tablet TK 1 T PO BID FOR 10 DAYS 05/08/18   [provider]  fenofibrate 160 MG tablet Take 160 mg by mouth daily.    [provider]  fluconazole (DIFLUCAN) 100 MG tablet Take 1 tablet (100 mg total) by mouth daily. 05/04/18  Thurnell Lose, MD  furosemide (LASIX) 40 MG tablet  05/14/18   [provider]  losartan-hydrochlorothiazide (HYZAAR) 100-25 MG tablet Take 1 tablet by mouth daily.  01/20/13   [provider]  metFORMIN (GLUCOPHAGE) 1000 MG tablet Take 1,000 mg by mouth 2 (two) times daily with a meal.     [provider]  metoprolol (LOPRESSOR) 50 MG tablet 50 mg 2 (two) times daily.  07/13/11   [provider]  Multiple Vitamins-Minerals (CENTRUM ADULTS) TABS Take by mouth every morning.    [provider]    Multiple Vitamins-Minerals (PRESERVISION AREDS 2 PO) Take 1 tablet by mouth daily.    [provider]  mupirocin ointment (BACTROBAN) 2 % APP TO RIGHT GREAT TOE BID AND COVER WITH BANDAGE 05/08/18   [provider]  ondansetron (ZOFRAN-ODT) 8 MG disintegrating tablet DIS 1 T ON THE TONGUE Q 8 H PRF NAUSEA 04/20/18   [provider]  ONE TOUCH ULTRA TEST test strip every morning. 05/13/11   [provider]  pantoprazole (PROTONIX) 40 MG tablet Take 1 tablet (40 mg total) by mouth 2 (two) times daily. 05/03/18   Thurnell Lose, MD  POLY-IRON 150 150 MG capsule Take 150 mg by mouth daily.  08/15/14   [provider]  simvastatin (ZOCOR) 20 MG tablet Take 20 mg by mouth daily at 6 PM.  08/15/14   [provider]  traMADol (ULTRAM) 50 MG tablet Take 1 tablet (50 mg total) by mouth every 6 (six) hours as needed. Patient not taking: Reported on 04/29/2018 09/09/14   Volanda Napoleon, MD  triamcinolone cream (KENALOG) 0.1 % APP TO LEGS BID FOR REDNESS 04/20/18   [provider]    Family History Family History  Problem Relation Age of Onset  . Cancer Sister        x 2  . Diabetes Son   . Colon cancer Neg Hx     Social History Social History   Tobacco Use  . Smoking status: Former Smoker    Last attempt to quit: 06/10/1952    Years since quitting: 65.9  . Smokeless tobacco: Never Used  . Tobacco comment: never used tobacco  Substance Use Topics  . Alcohol use: No    Alcohol/week: 0.0 standard drinks  . Drug use: No     Allergies   Patient has no known allergies.   Review of Systems Review of Systems  All other systems reviewed and are negative.    Physical Exam Updated Vital Signs BP 115/87   Pulse (!) 50   Resp 16   SpO2 100%   Physical Exam Vitals signs and nursing note reviewed.  Constitutional:      Appearance: He is well-developed.  HENT:     Head: Normocephalic and atraumatic.  Neck:      Musculoskeletal: Normal range of motion.  Cardiovascular:     Rate and Rhythm: Normal rate and regular rhythm.     Heart sounds: Normal heart sounds.  Pulmonary:     Effort: Pulmonary effort is normal. No respiratory distress.     Breath sounds: Normal breath sounds.  Abdominal:     General: There is no distension.     Palpations: Abdomen is soft.     Tenderness: There is no abdominal tenderness.  Musculoskeletal: Normal range of motion.        General: Swelling present.     Right lower leg: Edema present.     Left lower  leg: Edema present.  Skin:    General: Skin is warm and dry.  Neurological:     Mental Status: He is alert and oriented to person, place, and time.  Psychiatric:        Judgment: Judgment normal.      ED Treatments / Results  Labs (all labs ordered are listed, but only abnormal results are displayed) Labs Reviewed  CBC - Abnormal; Notable for the following components:      Result Value   RBC 3.04 (*)    Hemoglobin 9.3 (*)    HCT 29.2 (*)    RDW 19.3 (*)    Platelets 52 (*)    All other components within normal limits  COMPREHENSIVE METABOLIC PANEL - Abnormal; Notable for the following components:   Sodium 132 (*)    Potassium 5.2 (*)    CO2 17 (*)    BUN 42 (*)    Creatinine, Ser 3.44 (*)    Calcium 8.6 (*)    Total Protein 5.2 (*)    Albumin 2.5 (*)    AST 64 (*)    GFR calc non Af Amer 16 (*)    GFR calc Af Amer 18 (*)    All other components within normal limits  PROTIME-INR - Abnormal; Notable for the following components:   Prothrombin Time 17.8 (*)    All other components within normal limits  BRAIN NATRIURETIC PEPTIDE - Abnormal; Notable for the following components:   B Natriuretic Peptide 550.2 (*)    All other components within normal limits  TYPE AND SCREEN    BUN  Date Value Ref Range Status  05/19/2018 42 (H) 8 - 23 mg/dL Final  05/03/2018 40 (H) 8 - 23 mg/dL Final  05/02/2018 44 (H) 8 - 23 mg/dL Final  05/01/2018 51 (H) 8  - 23 mg/dL Final  09/15/2015 34.8 (H) 7.0 - 26.0 mg/dL Final  03/10/2015 20.5 7.0 - 26.0 mg/dL Final   BUN, Bld  Date Value Ref Range Status  06/04/2013 26 (H) 7 - 22 mg/dL Final   Creatinine  Date Value Ref Range Status  09/15/2015 1.9 (H) 0.7 - 1.3 mg/dL Final  03/10/2015 1.2 0.7 - 1.3 mg/dL Final   Creat  Date Value Ref Range Status  06/04/2013 1.1 0.6 - 1.2 mg/dl Final   Creatinine, Ser  Date Value Ref Range Status  05/19/2018 3.44 (H) 0.61 - 1.24 mg/dL Final  05/03/2018 2.24 (H) 0.61 - 1.24 mg/dL Final  05/02/2018 2.41 (H) 0.61 - 1.24 mg/dL Final  05/01/2018 2.47 (H) 0.61 - 1.24 mg/dL Final     EKG None  Radiology Dg Chest 2 View  Result Date: 05/19/2018 CLINICAL DATA:  Chronic productive cough EXAM: CHEST - 2 VIEW COMPARISON:  08/27/2006 FINDINGS: Small left effusion, new since 2008. No overt pulmonary edema. Left basilar atelectasis is identified. Stable mild cardiomegaly with uncoiled thoracic aorta. Osteoarthritis of the [facility] and glenohumeral joints. IMPRESSION: Small left effusion, new since 2008. Left basilar atelectasis. Electronically Signed   By: Ashley Royalty M.D.   On: 05/19/2018 21:41    Procedures Procedures (including critical care time)  Medications Ordered in ED Medications  furosemide (LASIX) injection 40 mg (has no administration in time range)     Initial Impression / Assessment and Plan / ED Course  I have reviewed the triage vital signs and the nursing notes.  Pertinent labs & imaging results that were available during my care of the patient were reviewed by me and considered  in my medical decision making (see chart for details).     Increasing generalized weakness.  Signs to suggest congestive heart failure with cough, new pleural effusion, ongoing lower extremity edema.  This is despite IV Lasix.  Bladder scan now.  Worsening acute kidney injury.  May be low output heart failure as a cause.  Mechanical fall today.  No obvious syncope.  Full  range of motion of major joints.  No closed head injury.  Patient be admitted to hospital for gentle IV diuresis and recheck of his renal function.  Bladder scan now.  Admit to [facility]ad hospitalist  Final Clinical Impressions(s) / ED Diagnoses   Final diagnoses:  None    ED Discharge Orders    None       Jola Schmidt, MD 05/20/18 [phone]

## 2018-05-20 ENCOUNTER — Other Ambulatory Visit: Payer: Self-pay

## 2018-05-20 ENCOUNTER — Inpatient Hospital Stay (HOSPITAL_COMMUNITY): Payer: Medicare Other

## 2018-05-20 ENCOUNTER — Encounter (HOSPITAL_COMMUNITY): Payer: Self-pay | Admitting: Internal Medicine

## 2018-05-20 DIAGNOSIS — N179 Acute kidney failure, unspecified: Secondary | ICD-10-CM

## 2018-05-20 DIAGNOSIS — Z6831 Body mass index (BMI) 31.0-31.9, adult: Secondary | ICD-10-CM | POA: Diagnosis not present

## 2018-05-20 DIAGNOSIS — E871 Hypo-osmolality and hyponatremia: Secondary | ICD-10-CM | POA: Diagnosis present

## 2018-05-20 DIAGNOSIS — R627 Adult failure to thrive: Secondary | ICD-10-CM | POA: Diagnosis present

## 2018-05-20 DIAGNOSIS — I8510 Secondary esophageal varices without bleeding: Secondary | ICD-10-CM | POA: Diagnosis present

## 2018-05-20 DIAGNOSIS — E039 Hypothyroidism, unspecified: Secondary | ICD-10-CM | POA: Diagnosis present

## 2018-05-20 DIAGNOSIS — D638 Anemia in other chronic diseases classified elsewhere: Secondary | ICD-10-CM | POA: Diagnosis present

## 2018-05-20 DIAGNOSIS — R600 Localized edema: Secondary | ICD-10-CM

## 2018-05-20 DIAGNOSIS — E1122 Type 2 diabetes mellitus with diabetic chronic kidney disease: Secondary | ICD-10-CM | POA: Diagnosis present

## 2018-05-20 DIAGNOSIS — I509 Heart failure, unspecified: Secondary | ICD-10-CM

## 2018-05-20 DIAGNOSIS — D500 Iron deficiency anemia secondary to blood loss (chronic): Secondary | ICD-10-CM | POA: Diagnosis present

## 2018-05-20 DIAGNOSIS — R601 Generalized edema: Secondary | ICD-10-CM | POA: Diagnosis present

## 2018-05-20 DIAGNOSIS — K7469 Other cirrhosis of liver: Secondary | ICD-10-CM | POA: Diagnosis present

## 2018-05-20 DIAGNOSIS — E43 Unspecified severe protein-calorie malnutrition: Secondary | ICD-10-CM | POA: Diagnosis present

## 2018-05-20 DIAGNOSIS — Z515 Encounter for palliative care: Secondary | ICD-10-CM | POA: Diagnosis not present

## 2018-05-20 DIAGNOSIS — N184 Chronic kidney disease, stage 4 (severe): Secondary | ICD-10-CM | POA: Diagnosis present

## 2018-05-20 DIAGNOSIS — Z7189 Other specified counseling: Secondary | ICD-10-CM | POA: Diagnosis not present

## 2018-05-20 DIAGNOSIS — D61818 Other pancytopenia: Secondary | ICD-10-CM | POA: Diagnosis present

## 2018-05-20 DIAGNOSIS — R1312 Dysphagia, oropharyngeal phase: Secondary | ICD-10-CM | POA: Diagnosis present

## 2018-05-20 DIAGNOSIS — E11649 Type 2 diabetes mellitus with hypoglycemia without coma: Secondary | ICD-10-CM | POA: Diagnosis present

## 2018-05-20 DIAGNOSIS — K766 Portal hypertension: Secondary | ICD-10-CM | POA: Diagnosis present

## 2018-05-20 DIAGNOSIS — Z66 Do not resuscitate: Secondary | ICD-10-CM | POA: Diagnosis not present

## 2018-05-20 DIAGNOSIS — E875 Hyperkalemia: Secondary | ICD-10-CM | POA: Diagnosis present

## 2018-05-20 DIAGNOSIS — E785 Hyperlipidemia, unspecified: Secondary | ICD-10-CM | POA: Diagnosis present

## 2018-05-20 DIAGNOSIS — I130 Hypertensive heart and chronic kidney disease with heart failure and stage 1 through stage 4 chronic kidney disease, or unspecified chronic kidney disease: Secondary | ICD-10-CM | POA: Diagnosis present

## 2018-05-20 DIAGNOSIS — E669 Obesity, unspecified: Secondary | ICD-10-CM | POA: Diagnosis present

## 2018-05-20 DIAGNOSIS — E1142 Type 2 diabetes mellitus with diabetic polyneuropathy: Secondary | ICD-10-CM | POA: Diagnosis present

## 2018-05-20 DIAGNOSIS — K7460 Unspecified cirrhosis of liver: Secondary | ICD-10-CM | POA: Diagnosis not present

## 2018-05-20 LAB — CBC WITH DIFFERENTIAL/PLATELET
Abs Immature Granulocytes: 0.01 10*3/uL (ref 0.00–0.07)
Basophils Absolute: 0 10*3/uL (ref 0.0–0.1)
Basophils Relative: 1 %
Eosinophils Absolute: 0.1 10*3/uL (ref 0.0–0.5)
Eosinophils Relative: 1 %
HCT: 27.9 % — ABNORMAL LOW (ref 39.0–52.0)
Hemoglobin: 8.6 g/dL — ABNORMAL LOW (ref 13.0–17.0)
IMMATURE GRANULOCYTES: 0 %
Lymphocytes Relative: 5 %
Lymphs Abs: 0.2 10*3/uL — ABNORMAL LOW (ref 0.7–4.0)
MCH: 29.7 pg (ref 26.0–34.0)
MCHC: 30.8 g/dL (ref 30.0–36.0)
MCV: 96.2 fL (ref 80.0–100.0)
Monocytes Absolute: 0.4 10*3/uL (ref 0.1–1.0)
Monocytes Relative: 9 %
Neutro Abs: 3.3 10*3/uL (ref 1.7–7.7)
Neutrophils Relative %: 84 %
Platelets: 41 10*3/uL — ABNORMAL LOW (ref 150–400)
RBC: 2.9 MIL/uL — ABNORMAL LOW (ref 4.22–5.81)
RDW: 19.5 % — ABNORMAL HIGH (ref 11.5–15.5)
WBC: 3.9 10*3/uL — ABNORMAL LOW (ref 4.0–10.5)
nRBC: 0 % (ref 0.0–0.2)

## 2018-05-20 LAB — TROPONIN I: Troponin I: <0.03 ng/mL (ref <0.03)

## 2018-05-20 LAB — GLUCOSE, CAPILLARY
GLUCOSE-CAPILLARY: 51 mg/dL — AB (ref 70–99)
GLUCOSE-CAPILLARY: 84 mg/dL (ref 70–99)
Glucose-Capillary: 73 mg/dL (ref 70–99)
Glucose-Capillary: 67 mg/dL — ABNORMAL LOW (ref 70–99)
Glucose-Capillary: 90 mg/dL (ref 70–99)
Glucose-Capillary: 67 mg/dL — ABNORMAL LOW (ref 70–99)
Glucose-Capillary: 82 mg/dL (ref 70–99)
Glucose-Capillary: 69 mg/dL — ABNORMAL LOW (ref 70–99)

## 2018-05-20 LAB — BASIC METABOLIC PANEL
Anion gap: 9 (ref 5–15)
BUN: 43 mg/dL — ABNORMAL HIGH (ref 8–23)
CO2: 16 mmol/L — ABNORMAL LOW (ref 22–32)
Calcium: 8.6 mg/dL — ABNORMAL LOW (ref 8.9–10.3)
Chloride: 108 mmol/L (ref 98–111)
Creatinine, Ser: 3.46 mg/dL — ABNORMAL HIGH (ref 0.61–1.24)
GFR calc Af Amer: 18 mL/min — ABNORMAL LOW (ref >60)
GFR calc non Af Amer: 15 mL/min — ABNORMAL LOW (ref >60)
Glucose, Bld: 53 mg/dL — ABNORMAL LOW (ref 70–99)
POTASSIUM: 5 mmol/L (ref 3.5–5.1)
Sodium: 133 mmol/L — ABNORMAL LOW (ref 135–145)

## 2018-05-20 LAB — HEPATIC FUNCTION PANEL
ALT: 24 U/L (ref 0–44)
AST: 63 U/L — ABNORMAL HIGH (ref 15–41)
Albumin: 2.4 g/dL — ABNORMAL LOW (ref 3.5–5.0)
Alkaline Phosphatase: 44 U/L (ref 38–126)
Bilirubin, Direct: 0.3 mg/dL — ABNORMAL HIGH (ref 0.0–0.2)
Indirect Bilirubin: 0.8 mg/dL (ref 0.3–0.9)
Total Bilirubin: 1.1 mg/dL (ref 0.3–1.2)
Total Protein: 4.8 g/dL — ABNORMAL LOW (ref 6.5–8.1)

## 2018-05-20 LAB — URINALYSIS, ROUTINE W REFLEX MICROSCOPIC
Bilirubin Urine: NEGATIVE
Glucose, UA: NEGATIVE mg/dL
Hgb urine dipstick: NEGATIVE
Ketones, ur: NEGATIVE mg/dL
Leukocytes, UA: NEGATIVE
NITRITE: NEGATIVE
Protein, ur: NEGATIVE mg/dL
Specific Gravity, Urine: 1.004 — ABNORMAL LOW (ref 1.005–1.030)
pH: 5 (ref 5.0–8.0)

## 2018-05-20 LAB — CREATININE, URINE, RANDOM: CREATININE, URINE: 25.54 mg/dL

## 2018-05-20 LAB — MAGNESIUM: Magnesium: 1.5 mg/dL — ABNORMAL LOW (ref 1.7–2.4)

## 2018-05-20 LAB — TSH: TSH: 7.368 u[IU]/mL — ABNORMAL HIGH (ref 0.350–4.500)

## 2018-05-20 LAB — SODIUM, URINE, RANDOM: Sodium, Ur: 78 mmol/L

## 2018-05-20 LAB — BRAIN NATRIURETIC PEPTIDE: B Natriuretic Peptide: 550.2 pg/mL — ABNORMAL HIGH (ref 0.0–100.0)

## 2018-05-20 MED ORDER — INSULIN ASPART 100 UNIT/ML ~~LOC~~ SOLN: 0.0000 [IU] | Freq: Three times a day (TID) | SUBCUTANEOUS | Status: DC

## 2018-05-20 MED ORDER — ALBUMIN HUMAN 25 % IV SOLN
50.0000 g | Freq: Once | INTRAVENOUS | Status: AC
  Administered 2018-05-20: 50 g via INTRAVENOUS
  Filled 2018-05-20: qty 200

## 2018-05-20 MED ORDER — METOPROLOL TARTRATE 50 MG PO TABS
50.0000 mg | ORAL_TABLET | Freq: Two times a day (BID) | ORAL | Status: DC
  Filled 2018-05-20: qty 1

## 2018-05-20 MED ORDER — DOXYCYCLINE HYCLATE 100 MG PO TABS
100.0000 mg | ORAL_TABLET | Freq: Two times a day (BID) | ORAL | Status: DC
  Administered 2018-05-20 – 2018-05-22 (×7): 100 mg via ORAL
  Filled 2018-05-20 (×7): qty 1

## 2018-05-20 MED ORDER — PANTOPRAZOLE SODIUM 40 MG PO TBEC
40.0000 mg | DELAYED_RELEASE_TABLET | Freq: Two times a day (BID) | ORAL | Status: DC
  Administered 2018-05-20 – 2018-05-28 (×17): 40 mg via ORAL
  Filled 2018-05-20 (×17): qty 1

## 2018-05-20 MED ORDER — ADULT MULTIVITAMIN W/MINERALS CH
1.0000 | ORAL_TABLET | Freq: Every morning | ORAL | Status: DC
  Administered 2018-05-20 – 2018-05-28 (×8): 1 via ORAL
  Filled 2018-05-20 (×8): qty 1

## 2018-05-20 MED ORDER — FUROSEMIDE 10 MG/ML IJ SOLN
40.0000 mg | Freq: Once | INTRAMUSCULAR | Status: AC
  Administered 2018-05-20: 40 mg via INTRAVENOUS
  Filled 2018-05-20: qty 4

## 2018-05-20 MED ORDER — MIDODRINE HCL 5 MG PO TABS
5.0000 mg | ORAL_TABLET | Freq: Three times a day (TID) | ORAL | Status: DC
  Administered 2018-05-20: 5 mg via ORAL
  Filled 2018-05-20: qty 1

## 2018-05-20 MED ORDER — MIDODRINE HCL 5 MG PO TABS
5.0000 mg | ORAL_TABLET | Freq: Three times a day (TID) | ORAL | Status: DC
  Administered 2018-05-21 – 2018-05-28 (×19): 5 mg via ORAL
  Filled 2018-05-20 (×19): qty 1

## 2018-05-20 MED ORDER — ALBUMIN HUMAN 25 % IV SOLN
12.5000 g | Freq: Four times a day (QID) | INTRAVENOUS | Status: DC
  Filled 2018-05-20: qty 50

## 2018-05-20 MED ORDER — POLYSACCHARIDE IRON COMPLEX 150 MG PO CAPS
150.0000 mg | ORAL_CAPSULE | Freq: Every day | ORAL | Status: DC
  Administered 2018-05-20 – 2018-05-23 (×4): 150 mg via ORAL
  Filled 2018-05-20 (×5): qty 1

## 2018-05-20 MED ORDER — ALBUMIN HUMAN 25 % IV SOLN
25.0000 g | Freq: Four times a day (QID) | INTRAVENOUS | Status: DC
  Administered 2018-05-20: 25 g via INTRAVENOUS
  Filled 2018-05-20 (×2): qty 100

## 2018-05-20 MED ORDER — ALBUMIN HUMAN 25 % IV SOLN
100.0000 g | Freq: Every day | INTRAVENOUS | Status: DC
  Filled 2018-05-20: qty 400

## 2018-05-20 MED ORDER — ACETAMINOPHEN 325 MG PO TABS: 650.0000 mg | ORAL_TABLET | Freq: Four times a day (QID) | ORAL | Status: DC | PRN

## 2018-05-20 MED ORDER — ONDANSETRON HCL 4 MG PO TABS: 4.0000 mg | ORAL_TABLET | Freq: Four times a day (QID) | ORAL | Status: DC | PRN

## 2018-05-20 MED ORDER — SIMVASTATIN 20 MG PO TABS
20.0000 mg | ORAL_TABLET | Freq: Every day | ORAL | Status: DC
  Administered 2018-05-20 – 2018-05-27 (×8): 20 mg via ORAL
  Filled 2018-05-20 (×8): qty 1

## 2018-05-20 MED ORDER — FUROSEMIDE 10 MG/ML IJ SOLN
120.0000 mg | Freq: Once | INTRAVENOUS | Status: DC
  Filled 2018-05-20: qty 12

## 2018-05-20 MED ORDER — ONDANSETRON HCL 4 MG/2ML IJ SOLN
4.0000 mg | Freq: Four times a day (QID) | INTRAMUSCULAR | Status: DC | PRN
  Administered 2018-05-20 – 2018-05-22 (×3): 4 mg via INTRAVENOUS
  Filled 2018-05-20 (×3): qty 2

## 2018-05-20 MED ORDER — ACETAMINOPHEN 650 MG RE SUPP: 650.0000 mg | Freq: Four times a day (QID) | RECTAL | Status: DC | PRN

## 2018-05-20 MED ORDER — FENOFIBRATE 160 MG PO TABS
160.0000 mg | ORAL_TABLET | Freq: Every day | ORAL | Status: DC
  Administered 2018-05-20: 160 mg via ORAL
  Filled 2018-05-20: qty 1

## 2018-05-20 NOTE — Consult Note (Signed)
Reason for Consult: Acute kidney injury on CKD stage IV Referring Physician: Domenic Polite M.D.  HPI:  85 year old Caucasian man with past medical history significant for type 2 diabetes mellitus, hypertension, pancytopenia chronic kidney disease stage IV (baseline creatinine 2.2-2.4). He also has cryptogenic cirrhosis with recent hospitalization for acute blood loss anemia secondary to variceal bleeding requiring 3 units PRBC transfusion with discharge on 05/03/2018.  He was brought to the emergency room yesterday with complaints of increasing shortness of breath, pedal edema/abdominal distention and progressive weakness with an episode of fall without any syncope/head injury.  He apparently was in his usual state of health and with "everything normal" when seen by [facility] RN on Monday.  Denies any nausea, vomiting or diarrhea but notes decreased appetite and drinking canned low-sodium tomato and chicken noodle soup for the past several days.  Denies any dysuria, flank pain, fever or chills.   04/30/2018  05/02/2018  05/03/2018  05/19/2018  05/20/2018  BUN 55 (H) 44 (H) 40 (H) 42 (H) 43 (H)  Creatinine 2.44 (H) 2.41 (H) 2.24 (H) 3.44 (H) 3.46 (H)    Past Medical History:  Diagnosis Date  . Adenomatous colon polyp   . Anemia   . AVM (arteriovenous malformation) of colon   . Chronic kidney disease, stage III (moderate) (HCC)   . Diabetes ([place])   . Dyslipidemia   . Esophageal varices ([place])   . GI bleed   . Helicobacter pylori gastritis   . Hypertension   . Iron deficiency anemia   . Obesity   . Peripheral neuropathy   . Portal hypertension ([place])   . Splenomegaly   . Thrombocytopenia ([place])   . Thrombocytopenia ([place])   . TIA (transient ischemic attack)    ?  Marland Kitchen Vertigo   . Vertigo     Past Surgical History:  Procedure Laterality Date  . COLONOSCOPY N/A 06/10/2013   Procedure: COLONOSCOPY;  Surgeon: Lafayette Dragon, MD;  Location: [facility];  Service: Endoscopy;  Laterality: N/A;  . ESOPHAGEAL  BANDING  04/30/2018   Procedure: ESOPHAGEAL BANDING;  Surgeon: Lavena Bullion, DO;  Location: [facility];  Service: Gastroenterology;;  . ESOPHAGOGASTRODUODENOSCOPY N/A 06/10/2013   Procedure: ESOPHAGOGASTRODUODENOSCOPY (EGD);  Surgeon: Lafayette Dragon, MD;  Location: [facility];  Service: Endoscopy;  Laterality: N/A;  . ESOPHAGOGASTRODUODENOSCOPY (EGD) WITH PROPOFOL N/A 04/30/2018   Procedure: ESOPHAGOGASTRODUODENOSCOPY (EGD) WITH PROPOFOL;  Surgeon: Lavena Bullion, DO;  Location: [facility];  Service: Gastroenterology;  Laterality: N/A;  . TONSILLECTOMY AND ADENOIDECTOMY      Family History  Problem Relation Age of Onset  . Cancer Sister        x 2  . Diabetes Son   . Colon cancer Neg Hx     Social History:  reports that he quit smoking about 65 years ago. He has never used smokeless tobacco. He reports that he does not drink alcohol or use drugs.  Allergies: No Known Allergies  Medications:  Scheduled: . doxycycline  100 mg Oral BID  . fenofibrate  160 mg Oral QHS  . insulin aspart  0-9 Units Subcutaneous TID WC  . iron polysaccharides  150 mg Oral Daily  . multivitamin with minerals  1 tablet Oral q morning - 10a  . pantoprazole  40 mg Oral BID  . simvastatin  20 mg Oral q1800    BMP Latest Ref Rng & Units 05/20/2018 05/19/2018 05/03/2018  Glucose 70 - 99 mg/dL 53(L) 79 115(H)  BUN 8 - 23 mg/dL 43(H)  42(H) 40(H)  Creatinine 0.61 - 1.24 mg/dL 3.46(H) 3.44(H) 2.24(H)  Sodium 135 - 145 mmol/L 133(L) 132(L) 139  Potassium 3.5 - 5.1 mmol/L 5.0 5.2(H) 4.8  Chloride 98 - 111 mmol/L 108 107 116(H)  CO2 22 - 32 mmol/L 16(L) 17(L) 18(L)  Calcium 8.9 - 10.3 mg/dL 8.6(L) 8.6(L) 8.1(L)   CBC Latest Ref Rng & Units 05/20/2018 05/19/2018 05/03/2018  WBC 4.0 - 10.5 K/uL 3.9(L) 4.8 5.1  Hemoglobin 13.0 - 17.0 g/dL 8.6(L) 9.3(L) 8.7(L)  Hematocrit 39.0 - 52.0 % 27.9(L) 29.2(L) 28.3(L)  Platelets 150 - 400 K/uL 41(L) 52(L) 81(L)   Dg Chest 2 View  Result Date: 05/19/2018 CLINICAL  DATA:  Chronic productive cough EXAM: CHEST - 2 VIEW COMPARISON:  08/27/2006 FINDINGS: Small left effusion, new since 2008. No overt pulmonary edema. Left basilar atelectasis is identified. Stable mild cardiomegaly with uncoiled thoracic aorta. Osteoarthritis of the [facility] and glenohumeral joints. IMPRESSION: Small left effusion, new since 2008. Left basilar atelectasis. Electronically Signed   By: Ashley Royalty M.D.   On: 05/19/2018 21:41    Review of Systems  Constitutional: Positive for malaise/fatigue. Negative for chills and fever.  HENT: Negative.   Eyes: Negative.   Respiratory: Positive for shortness of breath. Negative for cough and hemoptysis.   Cardiovascular: Positive for leg swelling. Negative for chest pain, palpitations and orthopnea.  Gastrointestinal: Negative for abdominal pain, diarrhea, heartburn, nausea and vomiting.  Genitourinary: Negative.   Musculoskeletal: Negative.   Skin: Negative.   Neurological: Positive for focal weakness.       Bilateral legs    Blood pressure (!) 89/49, pulse 62, temperature 98.2 F (36.8 C), temperature source Oral, resp. rate 18, height 5\' 8"  (1.727 m), weight 94.4 kg, SpO2 96 %. Physical Exam  Nursing note and vitals reviewed. Constitutional: He is oriented to person, place, and time. He appears well-developed and well-nourished.  Appears tired/lethargic  HENT:  Head: Normocephalic and atraumatic.  Dry oral mucosa  Eyes: Pupils are equal, round, and reactive to light. Conjunctivae and EOM are normal. No scleral icterus.  Neck: Normal range of motion. Neck supple. No tracheal deviation present. No thyromegaly present.  Cardiovascular: Normal rate, regular rhythm and normal heart sounds.  No murmur heard. Respiratory: He has no wheezes. He has no rales.  Poor inspiratory effort with decreased breath sounds bilaterally  GI: Soft. Bowel sounds are normal. He exhibits distension. There is no abdominal tenderness. There is no guarding.   Moderate global distention  Musculoskeletal:        General: Edema present.     Comments: 2-3+ lower extremity edema  Neurological: He is oriented to person, place, and time.  Somewhat somnolent, awakens easily to calling out his name  Skin: Skin is warm and dry. No erythema.    Assessment/Plan: 1.  Acute kidney injury on chronic kidney disease stage IV: Appears to be hemodynamically mediated and HRS type I cannot be entirely excluded at this time given paucity of information especially with regards to urine electrolytes.  Clinically, he does have third spacing with significant pedal edema but chest x-ray/lung examination findings do not reveal pulmonary edema.  I will start him on midodrine to try and improve his blood pressure along with albumin to improve renal perfusion as his oral mucosa/oropharynx appears dry and he was recently on thiazide/ARB that could contribute to hemodynamically mediated/prerenal azotemia.  I have had a lengthy discussion with the patient's wife and his son and informed them that he would not be a  candidate for chronic outpatient dialysis due to his baseline functional status and multiple comorbidities.  We will continue to avoid iodinated intravenous contrast, NSAIDs and RAS blocking agents.  Recommend placement of Foley catheter for strict input/output.  Renal ultrasound/urine electrolytes pending. 2.  Hyponatremia: Secondary to acute kidney injury and cirrhosis with free water excretion defect.  Monitor with volume expansion. 3.  Hyperkalemia: Mild secondary to acute kidney injury and unrestricted oral intake, possibly effect of recent ARB use.  Monitor with volume expansion/urine output. 4.  Anemia: Secondary to recent GI bleed and chronic illness.  We will continue to follow this closely and check iron studies. 5.  History of cryptogenic cirrhosis 6.  Malnutrition/debilitation  Melina Mosteller K. 05/20/2018, 12:42 PM

## 2018-05-20 NOTE — Progress Notes (Signed)
PROGRESS NOTE    [name].  LKJ:[id number] DOB: 1933/01/24 DOA: 05/19/2018 PCP: Marton Redwood, MD  Brief Narrative: This is an 85 year old chronically ill male with history of cryptogenic cirrhosis, chronic kidney disease stage IV, type 2 diabetes, pancytopenia, hypertension, diabetes mellitus was hospitalized 3 weeks ago with upper GI bleed secondary to variceal bleed, treated with banding and transfused 3 units of PRBC, subsequently discharged home in a stable condition, brought to the ED by family yesterday with progressive weakness and a fall, not loss of consciousness yesterday. -Upon evaluation in the emergency room he was noted to have extensive edema extending to his scrotum including abdominal wall swelling, also found to have worsening creatinine to 3.4   Assessment & Plan:   Acute on chronic kidney disease stage IV with anasarca third spacing, in the background of cryptogenic cirrhosis -Certainly could be hepato-renal syndrome, appears to be third spacing due to cirrhosis/hypoalbuminemia -Given IV Lasix x1 yesterday in the ED -We will request nephrology consultation -Check urine sodium, creatinine and urea -Renal ultrasound -Would not be a good dialysis candidate, will need high-dose diuretics with albumin -May need palliative evaluation if does not turn around  Cryptogenic cirrhosis with portal hypertension, recent variceal bleed status post recent banding -As above, continue Protonix  Sinus bradycardia -Beta-blocker on hold  Right foot great toe wound -Recent debridement, complete recent course of doxycycline, wound consult  Diabetes mellitus type 2 -Sliding scale insulin  Pancytopenia -Due to splenomegaly and splenic sequestration from cirrhosis -Monitor  DVT prophylaxis: SCDs Code Status: Full code Family Communication: Son and wife Disposition Plan: To be determined, condition is quite tenuous at this time  Consultants:   Nephrology   Procedures:     Antimicrobials:    Subjective: -Mild bradycardia overnight, did not respond much to single dose of Lasix given in the ED last night  Objective: Vitals:   05/20/18 0122 05/20/18 0400 05/20/18 0840 05/20/18 1234  BP: (!) 108/52 (!) 93/54 (!) 102/53 (!) 89/49  Pulse: (!) 53 (!) 56 67 62  Resp: 20 20  18   Temp: 97.6 F (36.4 C) 98.2 F (36.8 C)    TempSrc:  Oral    SpO2: 98% 97% 100% 96%  Weight: 94.4 kg     Height: 5\' 8"  (1.727 m)       Intake/Output Summary (Last 24 hours) at 05/20/2018 1307 Last data filed at 05/20/2018 1252 Gross per 24 hour  Intake 480 ml  Output 200 ml  Net 280 ml   Filed Weights   05/20/18 0122  Weight: 94.4 kg    Examination:  General exam: Elderly, chronically ill male sitting up in bed, no distress, alert awake oriented to self and partly to place Respiratory system: Decreased breath sounds both bases Cardiovascular system: S1 & S2 heard, RRR. Gastrointestinal system: Abdomen is soft, mildly distended, abdominal wall edema noted, nontender, scrotal edema Central nervous system: Alert and oriented, no asterixis Extremities: 2-3+ edema extending to upper thigh Skin: Right foot great toe wound with dressing Psychiatry: Flat affect    Data Reviewed:   CBC: Recent Labs  Lab 05/19/18 2056 05/20/18 0343  WBC 4.8 3.9*  NEUTROABS  --  3.3  HGB 9.3* 8.6*  HCT 29.2* 27.9*  MCV 96.1 96.2  PLT 52* 41*   Basic Metabolic Panel: Recent Labs  Lab 05/19/18 2056 05/20/18 0343  NA 132* 133*  K 5.2* 5.0  CL 107 108  CO2 17* 16*  GLUCOSE 79 53*  BUN 42* 43*  CREATININE 3.44* 3.46*  CALCIUM 8.6* 8.6*  MG  --  1.5*   GFR: Estimated Creatinine Clearance: 18 mL/min (A) (by C-G formula based on SCr of 3.46 mg/dL (H)). Liver Function Tests: Recent Labs  Lab 05/19/18 2056 05/20/18 0343  AST 64* 63*  ALT 26 24  ALKPHOS 52 44  BILITOT 0.6 1.1  PROT 5.2* 4.8*  ALBUMIN 2.5* 2.4*   No results for input(s): LIPASE, AMYLASE in the last 168  hours. No results for input(s): AMMONIA in the last 168 hours. Coagulation Profile: Recent Labs  Lab 05/19/18 2056  INR 1.48   Cardiac Enzymes: Recent Labs  Lab 05/20/18 0343  TROPONINI <0.03   BNP (last 3 results) No results for input(s): PROBNP in the last 8760 hours. HbA1C: No results for input(s): HGBA1C in the last 72 hours. CBG: Recent Labs  Lab 05/20/18 0744 05/20/18 0812 05/20/18 1136 05/20/18 1155  GLUCAP 51* 73 67* 90   Lipid Profile: No results for input(s): CHOL, HDL, LDLCALC, TRIG, CHOLHDL, LDLDIRECT in the last 72 hours. Thyroid Function Tests: Recent Labs    05/20/18 0343  TSH 7.368*   Anemia Panel: No results for input(s): VITAMINB12, FOLATE, FERRITIN, TIBC, IRON, RETICCTPCT in the last 72 hours. Urine analysis: No results found for: COLORURINE, APPEARANCEUR, LABSPEC, PHURINE, GLUCOSEU, HGBUR, BILIRUBINUR, KETONESUR, PROTEINUR, UROBILINOGEN, NITRITE, LEUKOCYTESUR Sepsis Labs: @LABRCNTIP (procalcitonin:4,lacticidven:4)  )No results found for this or any previous visit (from the past 240 hour(s)).       Radiology Studies: Dg Chest 2 View  Result Date: 05/19/2018 CLINICAL DATA:  Chronic productive cough EXAM: CHEST - 2 VIEW COMPARISON:  08/27/2006 FINDINGS: Small left effusion, new since 2008. No overt pulmonary edema. Left basilar atelectasis is identified. Stable mild cardiomegaly with uncoiled thoracic aorta. Osteoarthritis of the [facility] and glenohumeral joints. IMPRESSION: Small left effusion, new since 2008. Left basilar atelectasis. Electronically Signed   By: Ashley Royalty M.D.   On: 05/19/2018 21:41        Scheduled Meds: . doxycycline  100 mg Oral BID  . fenofibrate  160 mg Oral QHS  . insulin aspart  0-9 Units Subcutaneous TID WC  . iron polysaccharides  150 mg Oral Daily  . multivitamin with minerals  1 tablet Oral q morning - 10a  . pantoprazole  40 mg Oral BID  . simvastatin  20 mg Oral q1800   Continuous Infusions: . furosemide        LOS: 0 days    Time spent: 48min    Domenic Polite, MD [facility] Page via [email or web address], password [email or web address] After 7PM please contact night-coverage  05/20/2018, 1:07 PM

## 2018-05-20 NOTE — H&P (Addendum)
History and Physical    Gary Ingram. CVE:[id number] DOB: [birth year] DOA: 05/19/2018  PCP: Marton Redwood, MD  Patient coming from: Home.  Chief Complaint: Fall and weakness.  HPI: Gary Ingram. is a 85 y.o. male with history of cryptogenic cirrhosis, diabetes mellitus type 2, pancytopenia, hypertension who was recently admitted for GI bleed 3 weeks ago and underwent EGD and esophageal varices banding and had required 3 units of PRBC transfusion was brought to the ER after patient was feeling weak and had a fall after getting up from the commode.  Family states family was with the patient when the incident happened and patient did not hit his head or lose consciousness.  Over the last few days patient has been getting more short of breath and increasing peripheral edema and also has been having some scrotal edema and abdominal distention.  Patient also has recently followed up with podiatrist who had his right great toe wound debridement and was placed on doxycycline.  ED Course: In the ER patient on exam has significant swelling of his lower extremities scrotal edema and ascites and x-ray shows new left pleural effusion for which patient was given 1 dose of Lasix.  Labs reveal worsening of his creatinine of 2.2-3.4 at this time.  UA is pending.  Complete blood count appears to be at baseline at this time.  Patient admitted for acute on chronic renal failure with anasarca.  Review of Systems: As per HPI, rest all negative.   Past Medical History:  Diagnosis Date  . Adenomatous colon polyp   . Anemia   . AVM (arteriovenous malformation) of colon   . Chronic kidney disease, stage III (moderate) (HCC)   . Diabetes ([place])   . Dyslipidemia   . Esophageal varices ([place])   . GI bleed   . Helicobacter pylori gastritis   . Hypertension   . Iron deficiency anemia   . Obesity   . Peripheral neuropathy   . Portal hypertension ([place])   . Splenomegaly   . Thrombocytopenia ([place])   .  Thrombocytopenia ([place])   . TIA (transient ischemic attack)    ?  Marland Kitchen Vertigo   . Vertigo     Past Surgical History:  Procedure Laterality Date  . COLONOSCOPY N/A 06/10/2013   Procedure: COLONOSCOPY;  Surgeon: Lafayette Dragon, MD;  Location: [facility];  Service: Endoscopy;  Laterality: N/A;  . ESOPHAGEAL BANDING  04/30/2018   Procedure: ESOPHAGEAL BANDING;  Surgeon: Lavena Bullion, DO;  Location: [facility];  Service: Gastroenterology;;  . ESOPHAGOGASTRODUODENOSCOPY N/A 06/10/2013   Procedure: ESOPHAGOGASTRODUODENOSCOPY (EGD);  Surgeon: Lafayette Dragon, MD;  Location: [facility];  Service: Endoscopy;  Laterality: N/A;  . ESOPHAGOGASTRODUODENOSCOPY (EGD) WITH PROPOFOL N/A 04/30/2018   Procedure: ESOPHAGOGASTRODUODENOSCOPY (EGD) WITH PROPOFOL;  Surgeon: Lavena Bullion, DO;  Location: [facility];  Service: Gastroenterology;  Laterality: N/A;  . TONSILLECTOMY AND ADENOIDECTOMY       reports that he quit smoking about 65 years ago. He has never used smokeless tobacco. He reports that he does not drink alcohol or use drugs.  No Known Allergies  Family History  Problem Relation Age of Onset  . Cancer Sister        x 2  . Diabetes Son   . Colon cancer Neg Hx     Prior to Admission medications   Medication Sig Start Date End Date Taking? Authorizing Provider  doxycycline (VIBRA-TABS) 100 MG tablet Take 100 mg by mouth 2 (two) times daily.  05/08/18  Yes [provider]  fenofibrate 160 MG tablet Take 160 mg by mouth at bedtime.    Yes [provider]  furosemide (LASIX) 40 MG tablet Take 40 mg by mouth daily with supper.  05/14/18  Yes [provider]  metoprolol (LOPRESSOR) 50 MG tablet Take 50 mg by mouth 2 (two) times daily.  07/13/11  Yes [provider]  Multiple Vitamins-Minerals (CENTRUM ADULTS) TABS Take 1 tablet by mouth every morning.    Yes [provider]  Multiple Vitamins-Minerals (PRESERVISION AREDS 2 PO) Take 1 tablet by  mouth 2 (two) times daily.    Yes [provider]  mupirocin ointment (BACTROBAN) 2 % Apply 1 application topically 2 (two) times daily as needed (for toe).  05/08/18  Yes [provider]  ondansetron (ZOFRAN-ODT) 8 MG disintegrating tablet Take 8 mg by mouth every 8 (eight) hours as needed for nausea or vomiting.  04/20/18  Yes [provider]  pantoprazole (PROTONIX) 40 MG tablet Take 1 tablet (40 mg total) by mouth 2 (two) times daily. 05/03/18  Yes Thurnell Lose, MD  POLY-IRON 150 150 MG capsule Take 150 mg by mouth daily.  08/15/14  Yes [provider]  simvastatin (ZOCOR) 20 MG tablet Take 20 mg by mouth daily at 6 PM.  08/15/14  Yes [provider]  spironolactone (ALDACTONE) 25 MG tablet Take 25 mg by mouth 2 (two) times daily.   Yes [provider]  triamcinolone cream (KENALOG) 0.1 % Apply 1 application topically 2 (two) times daily as needed (rash).  04/20/18  Yes [provider]  fluconazole (DIFLUCAN) 100 MG tablet Take 1 tablet (100 mg total) by mouth daily. Patient not taking: Reported on 05/20/2018 05/04/18   Thurnell Lose, MD  traMADol (ULTRAM) 50 MG tablet Take 1 tablet (50 mg total) by mouth every 6 (six) hours as needed. Patient not taking: Reported on 04/29/2018 09/09/14   Volanda Napoleon, MD    Physical Exam: Vitals:   05/19/18 2041 05/19/18 2130 05/19/18 2230 05/20/18 0122  BP:  115/87 107/63 (!) 108/52  Pulse:  (!) 50 (!) 55 (!) 53  Resp:  16 16 20   Temp:    97.6 F (36.4 C)  SpO2: 99% 100% 98% 98%      Constitutional: Moderately built and nourished. Vitals:   05/19/18 2041 05/19/18 2130 05/19/18 2230 05/20/18 0122  BP:  115/87 107/63 (!) 108/52  Pulse:  (!) 50 (!) 55 (!) 53  Resp:  16 16 20   Temp:    97.6 F (36.4 C)  SpO2: 99% 100% 98% 98%   Eyes: Anicteric no pallor. ENMT: No discharge from the ears eyes nose or mouth. Neck: No mass felt.  No neck rigidity no JVD appreciated. Respiratory:  No rhonchi or crepitations. Cardiovascular: S1-S2 heard. Abdomen: Mildly distended nontender bowel sounds present. Musculoskeletal: Bilateral lower extremity edema 3+ with scrotal edema. Skin: Chronic skin changes with ulceration of the right foot.  Dressing done.  There is also an ulceration of the left anterior shin. Neurologic: Alert awake oriented to time place and person.  Moves all extremities. Psychiatric: Appears normal per normal affect.   Labs on Admission: I have personally reviewed following labs and imaging studies  CBC: Recent Labs  Lab 05/19/18 2056  WBC 4.8  HGB 9.3*  HCT 29.2*  MCV 96.1  PLT 52*   Basic Metabolic Panel: Recent Labs  Lab 05/19/18 2056  NA 132*  K 5.2*  CL  107  CO2 17*  GLUCOSE 79  BUN 42*  CREATININE 3.44*  CALCIUM 8.6*   GFR: CrCl cannot be calculated (Unknown ideal weight.). Liver Function Tests: Recent Labs  Lab 05/19/18 2056  AST 64*  ALT 26  ALKPHOS 52  BILITOT 0.6  PROT 5.2*  ALBUMIN 2.5*   No results for input(s): LIPASE, AMYLASE in the last 168 hours. No results for input(s): AMMONIA in the last 168 hours. Coagulation Profile: Recent Labs  Lab 05/19/18 2056  INR 1.48   Cardiac Enzymes: No results for input(s): CKTOTAL, CKMB, CKMBINDEX, TROPONINI in the last 168 hours. BNP (last 3 results) No results for input(s): PROBNP in the last 8760 hours. HbA1C: No results for input(s): HGBA1C in the last 72 hours. CBG: No results for input(s): GLUCAP in the last 168 hours. Lipid Profile: No results for input(s): CHOL, HDL, LDLCALC, TRIG, CHOLHDL, LDLDIRECT in the last 72 hours. Thyroid Function Tests: No results for input(s): TSH, T4TOTAL, FREET4, T3FREE, THYROIDAB in the last 72 hours. Anemia Panel: No results for input(s): VITAMINB12, FOLATE, FERRITIN, TIBC, IRON, RETICCTPCT in the last 72 hours. Urine analysis: No results found for: COLORURINE, APPEARANCEUR, LABSPEC, PHURINE, GLUCOSEU, HGBUR, BILIRUBINUR,  KETONESUR, PROTEINUR, UROBILINOGEN, NITRITE, LEUKOCYTESUR Sepsis Labs: @LABRCNTIP (procalcitonin:4,lacticidven:4) )No results found for this or any previous visit (from the past 240 hour(s)).   Radiological Exams on Admission: Dg Chest 2 View  Result Date: 05/19/2018 CLINICAL DATA:  Chronic productive cough EXAM: CHEST - 2 VIEW COMPARISON:  08/27/2006 FINDINGS: Small left effusion, new since 2008. No overt pulmonary edema. Left basilar atelectasis is identified. Stable mild cardiomegaly with uncoiled thoracic aorta. Osteoarthritis of the [facility] and glenohumeral joints. IMPRESSION: Small left effusion, new since 2008. Left basilar atelectasis. Electronically Signed   By: Ashley Royalty M.D.   On: 05/19/2018 21:41    EKG: Independently reviewed.  Junctional rhythm but telemetry shows normal sinus rhythm.  Assessment/Plan Principal Problem:   Acute CHF (congestive heart failure) (HCC) Active Problems:   Esophageal varices (HCC)   DM2 (diabetes mellitus, type 2) (HCC)   Cirrhosis of liver without ascites (HCC)   Lower extremity edema   AKI (acute kidney injury) ([place])    1. Acute on chronic kidney disease stage III with creatinine worsening from 2.2-3.4 with history of cryptogenic cirrhosis concerning for hepatorenal syndrome.  Patient did receive 1 dose of Lasix in the ER.  I am holding off any further diuretics including Lasix and spironolactone and have started on albumin infusion.  Likely may need octreotide or midodrine.  Consult nephrology in the morning.  Closely follow intake output and metabolic panel.  Urine studies are still pending. 2. Sinus bradycardia -holding of metoprolol.  Initial EKG showing possible junctional rhythm monitor showing sinus bradycardia.  Check TSH. 3. Cirrhosis of liver with portal hypertension requiring esophageal banding 3 weeks ago for GI bleed.  We will closely monitor for any worsening.  Abdomen does look distended from fluid but no definite signs of any infection  at this time.  Patient is on empiric doxycycline for his foot infection. 4. Right foot great toe wound with recent debridement.  On doxycycline.  Wound team consult. 5. Diabetes mellitus type 2 we will keep patient on sliding scale coverage. 6. Pancytopenia likely from cirrhosis platelets have further decrease.  Closely follow.  Will check peripheral smear studies for any schistocytes.   DVT prophylaxis: SCDs due to thrombocytopenia. Code Status: Full code. Family Communication: Patient's son and wife. Disposition Plan: To be determined. Consults called: None.  Admission status: Outpatient.   Rise Patience MD [facility] Pager [phone].  If 7PM-7AM, please contact night-coverage [email or web address] Password [facility]  05/20/2018, 3:26 AM

## 2018-05-20 NOTE — Progress Notes (Signed)
Patient Blood sugar was 51-  Orange juice given will recheck.

## 2018-05-20 NOTE — Consult Note (Signed)
[place] Nurse wound consult note Patient receiving care in [place].  Male and male visitors presents.  Assisted by patient's primary RN and NT. Reason for Consult: right great toe Wound type: The plantar aspect of the right great toe has a wound that measures 0.4 cm x 0.4 cm x 0.2 cm.  The wound base is yellow.  I cannot probe my cotton tipped applicator into the wound any further than 0.2 cm.  There is no drainage, no erythema, no induration, no edema to the toe.  The male visitor stated that it was a callus that was removed by a physician, and then had further physician cutting to it.  Today it has a foam dressing over it.  Plan of care is to continue the foam dressing; change every 3 days and prn. The patient has a fungal rash to the left groin/abdominal fold.  Twice daily application of antifungal powder (available in the green and white bottle in clean utility) is to be used to treat this area. And, the patient normally wears diapers at home.  This has contributed to MASD to the buttocks.  Twice daily application of Crit[employer] Clear (in the purple and white tube in clean utility) and avoiding incontinent briefs should help resolve this issue. Monitor the wound area(s) for worsening of condition such as: Signs/symptoms of infection,  Increase in size,  Development of or worsening of odor, Development of pain, or increased pain at the affected locations.  Notify the medical team if any of these develop.  Thank you for the consult.  Discussed plan of care with the patient and bedside nurse.  [place] nurse will not follow at this time.  Please re-consult the [place] team if needed.  Val Riles, RN, MSN, CWOCN, CNS-BC, pager [phone]

## 2018-05-20 NOTE — Evaluation (Signed)
Physical Therapy Evaluation Patient Details Name: Gary Ingram. MRN: [id number] DOB: 08-21-1932 Today's Date: 05/20/2018   History of Present Illness  Pt is an 85 y.o. male admitted 05/19/18 with progressive weakness and fall. Worked up for AKI on CKD IV with anasarca; pt also with cryptogenic cirrhosis (recent admission with variceal bleed requiring banding). PMH includes R toe wound (recent debridement), CKD, DM2, HTN, DM.    Clinical Impression  Pt presents with an overall decrease in functional mobility secondary to above. PTA, pt ambulatory with walker; lives with wife who assists with ADLs. Today, pt required mod-maxA to stand and take pivotal steps to [facility] and recliner with RW; bowel incontinence, dependent for pericare. Limited by fatigue and generalized weakness. Increased time discussing recommendation for SNF-level therapies, but pt and family hesitant to this and hopeful pt can return home. Pt currently requiring heavy assist, at high risk for falls. Pt would benefit from continued acute PT services to maximize functional mobility and independence prior to d/c to SNF.     Follow Up Recommendations SNF;Supervision for mobility/OOB    Equipment Recommendations  None recommended by PT    Recommendations for Other Services       Precautions / Restrictions Precautions Precautions: Fall Restrictions Weight Bearing Restrictions: No      Mobility  Bed Mobility Overal bed mobility: Needs Assistance Bed Mobility: Supine to Sit     Supine to sit: Mod assist;HOB elevated     General bed mobility comments: Able to initiate movement to EOB with max encouragement/repeated cues. ModA to assist bilateral hips to EOB; cues for use of bed rail  Transfers Overall transfer level: Needs assistance Equipment used: Rolling walker (2 wheeled) Transfers: Sit to/from Stand Sit to Stand: Mod assist;Max assist;From elevated surface         General transfer comment: ModA to stand from  bed with RW, max cues for sequencing. MaxA to stand from [facility] and max encouragement for pt to assist as much as possible; very limited by fatigue  Ambulation/Gait Ambulation/Gait assistance: Mod assist;Max assist Gait Distance (Feet): 2 Feet Assistive device: Rolling walker (2 wheeled)   Gait velocity: Decreased Gait velocity interpretation: <1.31 ft/sec, indicative of household ambulator General Gait Details: Min-modA to take pivotal steps from bed to [facility] with RW; assist to navigate RW and frequent cues for steps; incontinent of bowel. MaxA to take pivotal steps from [facility] to recliner with RW; maxA to push RW and prevent posterior LOB  Stairs            Wheelchair Mobility    Modified Rankin (Stroke Patients Only)       Balance Overall balance assessment: Needs assistance;History of Falls Sitting-balance support: No upper extremity supported Sitting balance-Leahy Scale: Fair     Standing balance support: Bilateral upper extremity supported Standing balance-Leahy Scale: Poor                               Pertinent Vitals/Pain Pain Assessment: No/denies pain    Home Living Family/patient expects to be discharged to:: Skilled nursing facility Living Arrangements: Spouse/significant other;Children Available Help at Discharge: Family;Available 24 hours/day Type of Home: House Home Access: Stairs to enter Entrance Stairs-Rails: Right;Left;Can reach both Entrance Stairs-Number of Steps: 4 Home Layout: One level Home Equipment: Walker - 2 wheels;Cane - single point;Bedside commode;Walker - standard Additional Comments: Lives with wife who has own physical problems. Son available for assist as needed  Prior Function Level of Independence: Needs assistance   Gait / Transfers Assistance Needed: Ambulatory with standard walker  ADL's / Homemaking Assistance Needed: Wife assists with all ADLs, including bathroom, bathing, dressing        Hand Dominance         Extremity/Trunk Assessment   Upper Extremity Assessment Upper Extremity Assessment: Generalized weakness    Lower Extremity Assessment Lower Extremity Assessment: Generalized weakness    Cervical / Trunk Assessment Cervical / Trunk Assessment: Kyphotic  Communication   Communication: No difficulties  Cognition Arousal/Alertness: Awake/alert Behavior During Therapy: Flat affect Overall Cognitive Status: Impaired/Different from baseline Area of Impairment: Attention;Following commands;Safety/judgement;Problem solving                   Current Attention Level: Sustained   Following Commands: Follows one step commands with increased time Safety/Judgement: Decreased awareness of deficits Awareness: Intellectual Problem Solving: Slow processing;Decreased initiation;Difficulty sequencing;Requires verbal cues General Comments: Pt slow to respond with quiet voice at beginning of session, becoming more alert with mobility; quiet with minimal words by end of session (potential overwhelmed by info? fatigue?). Slow to follow commands, required multimodal cues throughout and max encouragement to assist      General Comments General comments (skin integrity, edema, etc.): Wife and son present during session; increased time spent discussing recommendation for SNF, family hesitent due to some family matters complicating home situation    Exercises     Assessment/Plan    PT Assessment Patient needs continued PT services  PT Problem List Decreased strength;Decreased activity tolerance;Decreased balance;Decreased mobility;Decreased coordination;Decreased cognition;Decreased knowledge of use of DME;Decreased safety awareness       PT Treatment Interventions DME instruction;Gait training;Stair training;Functional mobility training;Therapeutic activities;Therapeutic exercise;Balance training;Patient/family education    PT Goals (Current goals can be found in the Care Plan section)   Acute Rehab PT Goals Patient Stated Goal: Pt and wife want him to return home at d/c PT Goal Formulation: With patient/family Time For Goal Achievement: 06/03/18 Potential to Achieve Goals: Poor    Frequency Min 2X/week   Barriers to discharge        Co-evaluation               AM-PAC PT "6 Clicks" Mobility  Outcome Measure   Help needed moving from lying on your back to sitting on the side of a flat bed without using bedrails?: A Lot Help needed moving to and from a bed to a chair (including a wheelchair)?: A Lot Help needed standing up from a chair using your arms (e.g., wheelchair or bedside chair)?: A Lot Help needed to walk in hospital room?: A Lot Help needed climbing 3-5 steps with a railing? : Total 6 Click Score: 9    End of Session Equipment Utilized During Treatment: Gait belt Activity Tolerance: Patient limited by fatigue Patient left: in chair;with call bell/phone within reach;with chair alarm set;with family/visitor present Nurse Communication: Mobility status;Need for lift equipment PT Visit Diagnosis: Other abnormalities of gait and mobility (R26.89);Muscle weakness (generalized) (M62.81)    Time: 6283-1517 PT Time Calculation (min) (ACUTE ONLY): 31 min   Charges:   PT Evaluation $PT Eval Moderate Complexity: 1 Mod PT Treatments $Gait Training: 8-22 mins      Mabeline Caras, PT, DPT Acute Rehabilitation Services  Pager [phone] Office [place] 05/20/2018, 5:16 PM

## 2018-05-20 NOTE — Progress Notes (Signed)
Physical Therapy Treatment Patient Details Name: Gary Ingram. MRN: [id number] DOB: Aug 21, 1932 Today's Date: 05/20/2018    History of Present Illness Pt is an 85 y.o. male admitted 05/19/18 with progressive weakness and fall. Worked up for AKI on CKD IV with anasarca; pt also with cryptogenic cirrhosis (recent admission with variceal bleed requiring banding). PMH includes R toe wound (recent debridement), CKD, DM2, HTN, DM.   PT Comments    Pt seen for additional session for return to bed from recliner; able to practice with [facility] and nursing staff. Pt requiring mod-maxA for mobility. Tolerated Stedy well. Bowel incontinence dependent for pericare. Further mobility limited secondary to arrival of transport for ultrasound. Continue to recommend SNF-level therapies.   Follow Up Recommendations  SNF;Supervision for mobility/OOB     Equipment Recommendations  None recommended by PT    Recommendations for Other Services       Precautions / Restrictions Precautions Precautions: Fall;Other (comment) Precaution Comments: Bowel incontinence Restrictions Weight Bearing Restrictions: No    Mobility  Bed Mobility Overal bed mobility: Needs Assistance Bed Mobility: Sit to Supine     Supine to sit: Mod assist;HOB elevated Sit to supine: Mod assist;+2 for physical assistance   General bed mobility comments: Pt attempting to lay across bed sideways; cues for sequencing, still attempting to lay straight back. ModA+2 to control descent and reposition in bed  Transfers Overall transfer level: Needs assistance Equipment used: Rolling walker (2 wheeled) Transfers: Sit to/from Stand Sit to Stand: Mod assist;Max assist;From elevated surface         General transfer comment: MaxA to stand with Stedy; prolonged standing with minA for balance while performing pericare and NT replaced sacral pad. Transfer back to bed via Stedy. Pt tolerated okay  Ambulation/Gait Ambulation/Gait  assistance: Mod assist;Max assist Gait Distance (Feet): 2 Feet Assistive device: Rolling walker (2 wheeled)   Gait velocity: Decreased Gait velocity interpretation: <1.31 ft/sec, indicative of household ambulator General Gait Details: Min-modA to take pivotal steps from bed to [facility] with RW; assist to navigate RW and frequent cues for steps; incontinent of bowel. MaxA to take pivotal steps from [facility] to recliner with RW; maxA to push RW and prevent posterior LOB   Stairs             Wheelchair Mobility    Modified Rankin (Stroke Patients Only)       Balance Overall balance assessment: Needs assistance;History of Falls Sitting-balance support: No upper extremity supported Sitting balance-Leahy Scale: Fair     Standing balance support: Bilateral upper extremity supported Standing balance-Leahy Scale: Poor                              Cognition Arousal/Alertness: Awake/alert Behavior During Therapy: Flat affect Overall Cognitive Status: Impaired/Different from baseline Area of Impairment: Attention;Following commands;Safety/judgement;Problem solving                   Current Attention Level: Sustained   Following Commands: Follows one step commands with increased time Safety/Judgement: Decreased awareness of deficits Awareness: Intellectual Problem Solving: Slow processing;Decreased initiation;Difficulty sequencing;Requires verbal cues General Comments: Pt slow to respond with quiet voice at beginning of session, becoming more alert with mobility; quiet with minimal words by end of session (potential overwhelmed by info? fatigue?). Slow to follow commands, required multimodal cues throughout and max encouragement to assist      Exercises      General Comments General comments (skin integrity,  edema, etc.): Wife and son present during session; increased time spent discussing recommendation for SNF, family hesitent due to some family matters  complicating home situation      Pertinent Vitals/Pain Pain Assessment: No/denies pain    Home Living Family/patient expects to be discharged to:: Skilled nursing facility Living Arrangements: Spouse/significant other;Children Available Help at Discharge: Family;Available 24 hours/day Type of Home: House Home Access: Stairs to enter Entrance Stairs-Rails: Right;Left;Can reach both Home Layout: One level Home Equipment: Environmental consultant - 2 wheels;Cane - single point;Bedside commode;Walker - standard Additional Comments: Lives with wife who has own physical problems. Son available for assist as needed    Prior Function Level of Independence: Needs assistance  Gait / Transfers Assistance Needed: Ambulatory with standard walker ADL's / Homemaking Assistance Needed: Wife assists with all ADLs, including bathroom, bathing, dressing     PT Goals (current goals can now be found in the care plan section) Acute Rehab PT Goals Patient Stated Goal: Pt and wife want him to return home at d/c PT Goal Formulation: With patient/family Time For Goal Achievement: 06/03/18 Potential to Achieve Goals: Poor Progress towards PT goals: Progressing toward goals    Frequency    Min 2X/week      PT Plan Current plan remains appropriate    Co-evaluation              AM-PAC PT "6 Clicks" Mobility   Outcome Measure  Help needed turning from your back to your side while in a flat bed without using bedrails?: A Little Help needed moving from lying on your back to sitting on the side of a flat bed without using bedrails?: A Lot Help needed moving to and from a bed to a chair (including a wheelchair)?: A Lot Help needed standing up from a chair using your arms (e.g., wheelchair or bedside chair)?: A Lot Help needed to walk in hospital room?: A Lot Help needed climbing 3-5 steps with a railing? : Total 6 Click Score: 12    End of Session Equipment Utilized During Treatment: Gait belt Activity  Tolerance: Patient limited by fatigue Patient left: in bed;with call bell/phone within reach;with family/visitor present;with nursing/sitter in room Nurse Communication: Mobility status;Need for lift equipment PT Visit Diagnosis: Other abnormalities of gait and mobility (R26.89);Muscle weakness (generalized) (M62.81)     Time: 0814-4818 PT Time Calculation (min) (ACUTE ONLY): 17 min  Charges:  $Gait Training: 8-22 mins                    Mabeline Caras, PT, DPT Acute Rehabilitation Services  Pager [phone] Office [place] 05/20/2018, 5:21 PM

## 2018-05-20 NOTE — Progress Notes (Addendum)
Patient BP low - (89/49).  Paged MD Will continue to monitor.    Ok to give lasix if BP is above 90 systolic.   Pt does not seem diaphoretic, SOB or tachycardic.  Pt is sleepy, but not confused.   Responds appropriately.    Asked MD if ok to not place internal catheter if condom cath (pouch) stays on  MD ok as long as we can get accurate I&O

## 2018-05-21 LAB — GLUCOSE, CAPILLARY
GLUCOSE-CAPILLARY: 43 mg/dL — AB (ref 70–99)
GLUCOSE-CAPILLARY: 72 mg/dL (ref 70–99)
Glucose-Capillary: 62 mg/dL — ABNORMAL LOW (ref 70–99)
Glucose-Capillary: 59 mg/dL — ABNORMAL LOW (ref 70–99)
Glucose-Capillary: 68 mg/dL — ABNORMAL LOW (ref 70–99)
Glucose-Capillary: 71 mg/dL (ref 70–99)
Glucose-Capillary: 73 mg/dL (ref 70–99)
Glucose-Capillary: 72 mg/dL (ref 70–99)

## 2018-05-21 LAB — AMMONIA: Ammonia: 38 umol/L — ABNORMAL HIGH (ref 9–35)

## 2018-05-21 LAB — CBC
HCT: 24.1 % — ABNORMAL LOW (ref 39.0–52.0)
Hemoglobin: 7.6 g/dL — ABNORMAL LOW (ref 13.0–17.0)
MCH: 29.9 pg (ref 26.0–34.0)
MCHC: 31.5 g/dL (ref 30.0–36.0)
MCV: 94.9 fL (ref 80.0–100.0)
Platelets: 30 10*3/uL — ABNORMAL LOW (ref 150–400)
RBC: 2.54 MIL/uL — ABNORMAL LOW (ref 4.22–5.81)
RDW: 19.9 % — ABNORMAL HIGH (ref 11.5–15.5)
WBC: 2.4 10*3/uL — ABNORMAL LOW (ref 4.0–10.5)
nRBC: 0 % (ref 0.0–0.2)

## 2018-05-21 LAB — COMPREHENSIVE METABOLIC PANEL
ALT: 23 U/L (ref 0–44)
AST: 53 U/L — ABNORMAL HIGH (ref 15–41)
Albumin: 3 g/dL — ABNORMAL LOW (ref 3.5–5.0)
Alkaline Phosphatase: 40 U/L (ref 38–126)
Anion gap: 7 (ref 5–15)
BUN: 41 mg/dL — AB (ref 8–23)
CO2: 19 mmol/L — ABNORMAL LOW (ref 22–32)
Calcium: 8.5 mg/dL — ABNORMAL LOW (ref 8.9–10.3)
Chloride: 108 mmol/L (ref 98–111)
Creatinine, Ser: 3.55 mg/dL — ABNORMAL HIGH (ref 0.61–1.24)
GFR calc Af Amer: 17 mL/min — ABNORMAL LOW (ref >60)
GFR calc non Af Amer: 15 mL/min — ABNORMAL LOW (ref >60)
Glucose, Bld: 56 mg/dL — ABNORMAL LOW (ref 70–99)
Potassium: 5.2 mmol/L — ABNORMAL HIGH (ref 3.5–5.1)
Sodium: 134 mmol/L — ABNORMAL LOW (ref 135–145)
Total Bilirubin: 0.8 mg/dL (ref 0.3–1.2)
Total Protein: 5.1 g/dL — ABNORMAL LOW (ref 6.5–8.1)

## 2018-05-21 LAB — UREA NITROGEN, URINE: Urea Nitrogen, Ur: 130 mg/dL

## 2018-05-21 MED ORDER — ALBUMIN HUMAN 25 % IV SOLN
25.0000 g | Freq: Once | INTRAVENOUS | Status: AC
  Administered 2018-05-21: 25 g via INTRAVENOUS
  Filled 2018-05-21: qty 100

## 2018-05-21 MED ORDER — FUROSEMIDE 10 MG/ML IJ SOLN
80.0000 mg | Freq: Once | INTRAMUSCULAR | Status: AC
  Administered 2018-05-21: 80 mg via INTRAVENOUS
  Filled 2018-05-21: qty 8

## 2018-05-21 MED ORDER — PATIROMER SORBITEX CALCIUM 8.4 G PO PACK
8.4000 g | PACK | Freq: Every day | ORAL | Status: AC
  Administered 2018-05-21: 8.4 g via ORAL
  Filled 2018-05-21: qty 1

## 2018-05-21 MED ORDER — DEXTROSE 50 % IV SOLN
INTRAVENOUS | Status: AC
  Administered 2018-05-21 (×2): 25 mL
  Filled 2018-05-21: qty 50

## 2018-05-21 MED ORDER — ENSURE ENLIVE PO LIQD
237.0000 mL | Freq: Two times a day (BID) | ORAL | Status: DC
  Administered 2018-05-22 – 2018-05-26 (×4): 237 mL via ORAL

## 2018-05-21 MED ORDER — LEVOTHYROXINE SODIUM 50 MCG PO TABS
50.0000 ug | ORAL_TABLET | Freq: Every day | ORAL | Status: DC
  Administered 2018-05-21 – 2018-05-28 (×8): 50 ug via ORAL
  Filled 2018-05-21 (×8): qty 1

## 2018-05-21 NOTE — Progress Notes (Signed)
CBG = 69 last night. Juice given. CBG = 84 upon reassessment.   Condom cath draining well.  Temp = 93.0 rectally. Multiple warm blankets placed on patient. MD paged. Instructions received for warm blankets and to recheck temp in 2 hours.   Will continue to monitor.

## 2018-05-21 NOTE — Progress Notes (Signed)
Patient ID: Gary Maya., male   DOB: [birth year], 85 y.o.   MRN: [id number]  [facility] Progress Note   Assessment/ Plan:   1.  Acute kidney injury on chronic kidney disease stage IV: Nonoliguric, suspected to be hemodynamically mediated acute kidney injury, not convincing that this is HRS based on initial urine electrolytes (may indeed have been confounded by administration of furosemide earlier).  I will try mobilizing his interstitial fluid with combination of albumin/furosemide and will give Veltassa for management of his mildly elevated potassium.  He does have some ascites on ultrasound but physical exam reveals a soft abdomen and likely not suggestive of intra-abdominal hypertension. 2.  Hyponatremia: Secondary to acute kidney injury and cirrhosis with free water excretion defect.  Monitor with diuresis. 3.  Hyperkalemia: Mild secondary to acute kidney injury and unrestricted oral intake, possibly effect of recent ARB use.    Monitor with ongoing diuresis and Veltassa. 4.  Anemia: Secondary to recent GI bleed and chronic illness.  We will continue to follow this closely and check iron studies. 5.  History of cryptogenic cirrhosis 6.  Malnutrition/debilitation  Subjective:   Reports hypoglycemia earlier today, his son is concerned about low sugar diet.   Objective:   BP (!) 112/51 (BP Location: Right Arm)   Pulse 69   Temp (!) 94.5 F (34.7 C) (Rectal)   Resp 18   Ht 5\' 8"  (1.727 m)   Wt 93.4 kg   SpO2 99%   BMI 31.31 kg/m   Intake/Output Summary (Last 24 hours) at 05/21/2018 1093 Last data filed at 05/21/2018 0920 Gross per 24 hour  Intake 1438.35 ml  Output 850 ml  Net 588.35 ml   Weight change: -0.994 kg  Physical Exam: Gen: Comfortably resting in bed, wife and son at bedside (patient is hard of hearing). CVS: Pulse regular rhythm, normal rate, S1 and S2 normal Resp: Anteriorly clear to auscultation, diminished breath sounds over bases Abd: Soft,  obese, nontender Ext: 2-3+ lower extremity edema  Imaging: Dg Chest 2 View  Result Date: 05/19/2018 CLINICAL DATA:  Chronic productive cough EXAM: CHEST - 2 VIEW COMPARISON:  08/27/2006 FINDINGS: Small left effusion, new since 2008. No overt pulmonary edema. Left basilar atelectasis is identified. Stable mild cardiomegaly with uncoiled thoracic aorta. Osteoarthritis of the [facility] and glenohumeral joints. IMPRESSION: Small left effusion, new since 2008. Left basilar atelectasis. Electronically Signed   By: Ashley Royalty M.D.   On: 05/19/2018 21:41   US Renal  Result Date: 05/20/2018 CLINICAL DATA:  Acute renal injury EXAM: RENAL / URINARY TRACT ULTRASOUND COMPLETE COMPARISON:  None. FINDINGS: Right Kidney: Renal measurements: 9.3 x 3.7 x 4.4 cm = volume: 78.8 mL . Echogenicity within normal limits. No mass or hydronephrosis visualized. Left Kidney: Renal measurements: 9.1 x 4.0 x 4.4 cm = volume: 84.0 mL. Echogenicity within normal limits. No mass or hydronephrosis visualized. Bladder: Appears normal for degree of bladder distention. IMPRESSION: 1. Nodular liver consistent with cirrhosis. Bilateral pleural effusions and significant ascites. 2. No renal abnormalities. Electronically Signed   By: Dorise Bullion III M.D   On: 05/20/2018 15:11    Labs: BMET Recent Labs  Lab 05/19/18 2056 05/20/18 0343 05/21/18 0818  NA 132* 133* 134*  K 5.2* 5.0 5.2*  CL 107 108 108  CO2 17* 16* 19*  GLUCOSE 79 53* 56*  BUN 42* 43* 41*  CREATININE 3.44* 3.46* 3.55*  CALCIUM 8.6* 8.6* 8.5*   CBC Recent Labs  Lab 05/19/18 2056 05/20/18  1610 05/21/18 0818  WBC 4.8 3.9* 2.4*  NEUTROABS  --  3.3  --   HGB 9.3* 8.6* 7.6*  HCT 29.2* 27.9* 24.1*  MCV 96.1 96.2 94.9  PLT 52* 41* 30*   Medications:    . doxycycline  100 mg Oral BID  . insulin aspart  0-9 Units Subcutaneous TID WC  . iron polysaccharides  150 mg Oral Daily  . levothyroxine  50 mcg Oral Q0600  . midodrine  5 mg Oral TID WC  . multivitamin  with minerals  1 tablet Oral q morning - 10a  . pantoprazole  40 mg Oral BID  . patiromer  8.4 g Oral Daily  . simvastatin  20 mg Oral q1800   Elmarie Shiley, MD 05/21/2018, 9:22 AM

## 2018-05-21 NOTE — Plan of Care (Signed)
  Problem: Education: Goal: Knowledge of General Education information will improve Description Including pain rating scale, medication(s)/side effects and non-pharmacologic comfort measures Outcome: Progressing   Problem: Health Behavior/Discharge Planning: Goal: Ability to manage health-related needs will improve Outcome: Progressing   

## 2018-05-21 NOTE — Progress Notes (Signed)
MEWS/VS Documentation      05/21/2018 8185 05/21/2018 1013 05/21/2018 1130 05/21/2018 1200   MEWS Score:  2  3  2  2    MEWS Score Color:  Yellow  Yellow  Yellow  Yellow   Resp:  -  18  -  15   Pulse:  -  64  -  66   BP:  -  (!) 97/54  -  (!) 99/51   Temp:  -  -  (!) 95.1 F (35.1 C)  -   O2 Device:  -  Room Air  -  Room Air   Level of Consciousness:  Alert  -  -  -

## 2018-05-21 NOTE — Progress Notes (Addendum)
Initial Nutrition Assessment  DOCUMENTATION CODES:   Not applicable  INTERVENTION:   -Downgrade diet to dysphagia 2 diet (mechanical soft) for ease of intake; pt with minimal dentures and difficulty chewing meats and hard textured foods -Ensure Enlive po BID, each supplement provides 350 kcal and 20 grams of protein -MVI with minerals daily  NUTRITION DIAGNOSIS:   Inadequate oral intake related to decreased appetite(masticatory difficulty) as evidenced by meal completion < 50%, per patient/family report.  GOAL:   Patient will meet greater than or equal to 90% of their needs  MONITOR:   PO intake, Supplement acceptance, Weight trends, Labs, Skin, I & O's  REASON FOR ASSESSMENT:   Consult Assessment of nutrition requirement/status  ASSESSMENT:   Gary Ingram. is a 85 y.o. male with history of cryptogenic cirrhosis, diabetes mellitus type 2, pancytopenia, hypertension who was recently admitted for GI bleed 3 weeks ago and underwent EGD and esophageal varices banding and had required 3 units of PRBC transfusion was brought to the ER after patient was feeling weak and had a fall after getting up from the commode.  Family states family was with the patient when the incident happened and patient did not hit his head or lose consciousness.  Over the last few days patient has been getting more short of breath and increasing peripheral edema and also has been having some scrotal edema and abdominal distention.  Patient also has recently followed up with podiatrist who had his right great toe wound debridement and was placed on doxycycline.  Pt admitted with acute CHF exacerbation and acute on chronic kidney disease.   Reviewed I/O's: +558.3 ml x 24 hours and +358.3 ml since admission  Case discussed with RN, who reports that pt has had poor oral intake and hypoglycemia. Pt ate very little at lunch (had a chicken salad), but did not eat much due to difficulty chewing (pt has only has  top dentures). Pt will drink liquids and soft foods when encouraged.   Spoke with pt and wife at bedside. Pt reports good appetite until about 3 days ago, when he started feeling poorly. He endorses he consumes about 3 meals per day (meals typically include mashed potatoes, tomato soup, and green peas or scrambled eggs for breakfast). Beverages are usually diet soda. Pt does not consumes a lot of protein, due to difficulty chewing meat- he confirms he was unable to consume chicken salad or greens due to texture, but consumed some soup and diet coke.   Pt denies any weight loss. He reports UBW is around 200#. Noted distant history of wt loss, but none over the past year. Suspect edema may be masking true weight loss and muscle depletions.   Discussed importance of good meal and supplement intake to promote healing. Pt and wife amenable to diet downgrade and supplements.   Pt with poor oral intake and would benefit from nutrient dense supplement. One Ensure Enlive supplement provides 350 kcals, 20 grams protein, and 44-45 grams of carbohydrate vs one Glucerna shake supplement, which provides 220 kcals, 10 grams of protein, and 26 grams of carbohydrate. Given pt's hx of DM, RD will continue to monitor PO intake, CBGS, and adjust supplement regimen as appropriate.   No results found for: HGBA1C PTA DM medications are none. Discussed DM control at home. Pt wife denies any hypoglycemic episodes PTA.  Labs reviewed: Na: 134, K: 5.2, CBGS: 43-68 (inpatient orders for glycemic control are 0-9 units insulin aspart TID with meals ).   NUTRITION -  FOCUSED PHYSICAL EXAM:    Most Recent Value  Orbital Region  No depletion  Upper Arm Region  Mild depletion  Thoracic and Lumbar Region  No depletion  Buccal Region  No depletion  Temple Region  No depletion  Clavicle Bone Region  Mild depletion  Clavicle and Acromion Bone Region  No depletion  Scapular Bone Region  No depletion  Dorsal Hand  Mild depletion   Patellar Region  No depletion  Anterior Thigh Region  No depletion  Posterior Calf Region  No depletion  Edema (RD Assessment)  Moderate  Hair  Reviewed  Eyes  Reviewed  Mouth  Reviewed  Skin  Reviewed  Nails  Reviewed       Diet Order:   Diet Order            DIET DYS 2 Room service appropriate? Yes; Fluid consistency: Thin; Fluid restriction: 1200 mL Fluid  Diet effective now              EDUCATION NEEDS:   Education needs have been addressed  Skin:  Skin Assessment: Skin Integrity Issues: Skin Integrity Issues:: Other (Comment) Other: MASD buttocks, plantar wound to great rt toe  Last BM:  05/21/18  Height:   Ht Readings from Last 1 Encounters:  05/20/18 5\' 8"  (1.727 m)    Weight:   Wt Readings from Last 1 Encounters:  05/21/18 93.4 kg    Ideal Body Weight:  70 kg  BMI:  Body mass index is 31.31 kg/m.  Estimated Nutritional Needs:   Kcal:  1791-5056  Protein:  85-100 grams  Fluid:  per MD    Gary Ingram, RD, LDN, CDE Pager: [phone] After hours Pager: [phone]

## 2018-05-21 NOTE — Progress Notes (Addendum)
PROGRESS NOTE    [name].  ERD:[id number] DOB: [birth year] DOA: 05/19/2018 PCP: Marton Redwood, MD  Brief Narrative: This is an 85 year old chronically ill male with history of cryptogenic cirrhosis, chronic kidney disease stage IV, type 2 diabetes, pancytopenia, hypertension, diabetes mellitus was hospitalized 3 weeks ago with upper GI bleed secondary to variceal bleed, treated with banding and transfused 3 units of PRBC, subsequently discharged home in a stable condition, brought to the ED by family yesterday with  Fall, no loss of consciousness. -Upon evaluation in the emergency room he was noted to have extensive edema extending to his scrotum including abdominal wall swelling, also found to have worsening creatinine to 3.4   Assessment & Plan:   Acute on chronic kidney disease stage IV with anasarca third spacing, in the background of cryptogenic cirrhosis -Concern that this could be hemodynamically mediated, hepatorenal syndrome appears less likely -Appreciate nephrology consult, given significant third spacing/anasarca plan to diurese with combination of albumin and Lasix today, also started midodrine yesterday -Discussed with patient and family that he would not be a good dialysis candidate should his kidney function deteriorate further, also strongly recommended consideration of DNR -Renal ultrasound without hydronephrosis -We will consider palliative care evaluation if deteriorates  Cryptogenic cirrhosis with portal hypertension, recent variceal bleed status post recent banding -As above, continue Protonix -Hb has trended down a bit, no active bleeding, check iron studies, monitor -I suspect his volume overloaded state is causing some degree of hemodilution -also check ammonia level  Sinus bradycardia -Beta-blocker on hold  Right foot great toe wound -Recent debridement, complete recent course of doxycycline, wound consult  Diabetes mellitus type 2 -Sliding scale  insulin  Pancytopenia -Due to splenomegaly and splenic sequestration from cirrhosis -Monitor  Hypothyroidism -Start Synthroid  DVT prophylaxis: SCDs Code Status: Full code, strongly recommended consideration of DNR Family Communication: Son and wife Disposition Plan: To be determined, condition is quite tenuous at this time  Consultants:   Nephrology   Procedures:   Antimicrobials:    Subjective: -No events overnight, complains of weakness, patient reports feeling sleepy all the time  Objective: Vitals:   05/21/18 0459 05/21/18 1013 05/21/18 1130 05/21/18 1200  BP:  (!) 97/54  (!) 99/51  Pulse:  64  66  Resp:  18  15  Temp: (!) 94.5 F (34.7 C)  (!) 95.1 F (35.1 C)   TempSrc: Rectal  Rectal   SpO2:  97%  97%  Weight:      Height:        Intake/Output Summary (Last 24 hours) at 05/21/2018 1320 Last data filed at 05/21/2018 1220 Gross per 24 hour  Intake 1360.44 ml  Output 1100 ml  Net 260.44 ml   Filed Weights   05/20/18 0122 05/21/18 0334  Weight: 94.4 kg 93.4 kg    Examination:  Gen: Awake, Alert, Oriented X 2, chronically ill-appearing male HEENT: PERRLA, Neck supple, no JVD Lungs: Decreased breath sounds at both bases CVS: RRR,No Gallops,Rubs or new Murmurs Abd: Soft, nontender, distended, abdominal wall edema, scrotal edema Extremities: 2-3+ edema extending to upper thigh Skin: Right foot great toe wound with dressing Psychiatry: Flat affect    Data Reviewed:   CBC: Recent Labs  Lab 05/19/18 2056 05/20/18 0343 05/21/18 0818  WBC 4.8 3.9* 2.4*  NEUTROABS  --  3.3  --   HGB 9.3* 8.6* 7.6*  HCT 29.2* 27.9* 24.1*  MCV 96.1 96.2 94.9  PLT 52* 41* 30*   Basic Metabolic Panel: Recent Labs  Lab 05/19/18 2056 05/20/18 0343 05/21/18 0818  NA 132* 133* 134*  K 5.2* 5.0 5.2*  CL 107 108 108  CO2 17* 16* 19*  GLUCOSE 79 53* 56*  BUN 42* 43* 41*  CREATININE 3.44* 3.46* 3.55*  CALCIUM 8.6* 8.6* 8.5*  MG  --  1.5*  --     GFR: Estimated Creatinine Clearance: 17.5 mL/min (A) (by C-G formula based on SCr of 3.55 mg/dL (H)). Liver Function Tests: Recent Labs  Lab 05/19/18 2056 05/20/18 0343 05/21/18 0818  AST 64* 63* 53*  ALT 26 24 23   ALKPHOS 52 44 40  BILITOT 0.6 1.1 0.8  PROT 5.2* 4.8* 5.1*  ALBUMIN 2.5* 2.4* 3.0*   No results for input(s): LIPASE, AMYLASE in the last 168 hours. Recent Labs  Lab 05/21/18 1010  AMMONIA 38*   Coagulation Profile: Recent Labs  Lab 05/19/18 2056  INR 1.48   Cardiac Enzymes: Recent Labs  Lab 05/20/18 0343  TROPONINI <0.03   BNP (last 3 results) No results for input(s): PROBNP in the last 8760 hours. HbA1C: No results for input(s): HGBA1C in the last 72 hours. CBG: Recent Labs  Lab 05/21/18 0426 05/21/18 0659 05/21/18 0734 05/21/18 1020 05/21/18 1127  GLUCAP 59* 43* 68* 71 73   Lipid Profile: No results for input(s): CHOL, HDL, LDLCALC, TRIG, CHOLHDL, LDLDIRECT in the last 72 hours. Thyroid Function Tests: Recent Labs    05/20/18 0343  TSH 7.368*   Anemia Panel: No results for input(s): VITAMINB12, FOLATE, FERRITIN, TIBC, IRON, RETICCTPCT in the last 72 hours. Urine analysis:    Component Value Date/Time   COLORURINE STRAW (A) 05/20/2018 0506   APPEARANCEUR CLEAR 05/20/2018 0506   LABSPEC 1.004 (L) 05/20/2018 0506   PHURINE 5.0 05/20/2018 0506   GLUCOSEU NEGATIVE 05/20/2018 0506   HGBUR NEGATIVE 05/20/2018 0506   BILIRUBINUR NEGATIVE 05/20/2018 0506   KETONESUR NEGATIVE 05/20/2018 0506   PROTEINUR NEGATIVE 05/20/2018 0506   NITRITE NEGATIVE 05/20/2018 0506   LEUKOCYTESUR NEGATIVE 05/20/2018 0506   Sepsis Labs: @LABRCNTIP (procalcitonin:4,lacticidven:4)  )No results found for this or any previous visit (from the past 240 hour(s)).       Radiology Studies: Dg Chest 2 View  Result Date: 05/19/2018 CLINICAL DATA:  Chronic productive cough EXAM: CHEST - 2 VIEW COMPARISON:  08/27/2006 FINDINGS: Small left effusion, new since  2008. No overt pulmonary edema. Left basilar atelectasis is identified. Stable mild cardiomegaly with uncoiled thoracic aorta. Osteoarthritis of the [facility] and glenohumeral joints. IMPRESSION: Small left effusion, new since 2008. Left basilar atelectasis. Electronically Signed   By: Ashley Royalty M.D.   On: 05/19/2018 21:41   US Renal  Result Date: 05/20/2018 CLINICAL DATA:  Acute renal injury EXAM: RENAL / URINARY TRACT ULTRASOUND COMPLETE COMPARISON:  None. FINDINGS: Right Kidney: Renal measurements: 9.3 x 3.7 x 4.4 cm = volume: 78.8 mL . Echogenicity within normal limits. No mass or hydronephrosis visualized. Left Kidney: Renal measurements: 9.1 x 4.0 x 4.4 cm = volume: 84.0 mL. Echogenicity within normal limits. No mass or hydronephrosis visualized. Bladder: Appears normal for degree of bladder distention. IMPRESSION: 1. Nodular liver consistent with cirrhosis. Bilateral pleural effusions and significant ascites. 2. No renal abnormalities. Electronically Signed   By: Dorise Bullion III M.D   On: 05/20/2018 15:11        Scheduled Meds: . doxycycline  100 mg Oral BID  . furosemide  80 mg Intravenous Once  . insulin aspart  0-9 Units Subcutaneous TID WC  . iron polysaccharides  150  mg Oral Daily  . levothyroxine  50 mcg Oral Q0600  . midodrine  5 mg Oral TID WC  . multivitamin with minerals  1 tablet Oral q morning - 10a  . pantoprazole  40 mg Oral BID  . simvastatin  20 mg Oral q1800   Continuous Infusions: . albumin human       LOS: 1 day    Time spent: 13min    Domenic Polite, MD [facility] Page via [email or web address], password [email or web address] After 7PM please contact night-coverage  05/21/2018, 1:20 PM

## 2018-05-21 NOTE — Progress Notes (Signed)
Rectal temp 95.1 F, gradually increasing, will continue to monitor. MD notified.

## 2018-05-21 NOTE — Clinical Social Work Note (Signed)
Clinical Social Work Assessment  Patient Details  Name: Gary Ingram. MRN: [id number] Date of Birth: 02-10-1933  Date of referral:  05/21/18               Reason for consult:  Facility Placement, Discharge Planning                Permission sought to share information with:  Facility Sport and exercise psychologist, Family Supports Permission granted to share information::  Yes, Verbal Permission Granted  Name::     Phineas Semen and Akai Dollard  Agency::     Relationship::  Wife and son  Contact Information:  Phineas Semen: [phone] Doug: [phone]  Housing/Transportation Living arrangements for the past 2 months:  [place] of Information:  Patient, Medical Team, Adult Children, Spouse Patient Interpreter Needed:  None Criminal Activity/Legal Involvement Pertinent to Current Situation/Hospitalization:  No - Comment as needed Significant Relationships:  Adult Children, Spouse Lives with:  Spouse, Adult Children Do you feel safe going back to the place where you live?  Yes Need for family participation in patient care:  Yes (Comment)  Care giving concerns:  PT recommending SNF once medically stable for discharge.   Social Worker assessment / plan:  CSW met with patient. Wife and son at bedside. CSW introduced role and explained that PT recommendations would be discussed. Patient and family not interested in SNF but are agreeable to home health. He was working with [facility] prior to admission and have all the equipment they need at home. Son stated he is supposed to have a meeting with a Education officer, museum with [facility] tomorrow to discuss more supervision by a nurse at home. CSW explained this is likely a private pay service. RNCM aware. No further concerns. CSW signing off as social work intervention is no longer needed.  Employment status:  Retired Nurse, adult PT Recommendations:  [place] / Referral to community  resources:  [place]  Patient/Family's Response to care:  Patient and his family prefer home health. Patient's family supportive and involved in patient's care. Patient and his family appreciated social work intervention.  Patient/Family's Understanding of and Emotional Response to Diagnosis, Current Treatment, and Prognosis:  Patient and his family have a good understanding of the reason for admission and his need for continued therapy after discharge. Patient and his family appear happy with hospital care.  Emotional Assessment Appearance:  Appears stated age Attitude/Demeanor/Rapport:  Engaged Affect (typically observed):  Appropriate, Calm, Pleasant Orientation:  Oriented to Self, Oriented to Place, Oriented to  Time, Oriented to Situation Alcohol / Substance use:  Never Used Psych involvement (Current and /or in the community):  No (Comment)  Discharge Needs  Concerns to be addressed:  Care Coordination Readmission within the last 30 days:  Yes Current discharge risk:  Dependent with Mobility Barriers to Discharge:  Continued Medical Work up   Candie Chroman, LCSW 05/21/2018, 11:43 AM

## 2018-05-22 ENCOUNTER — Other Ambulatory Visit: Payer: Self-pay | Admitting: Primary Care

## 2018-05-22 ENCOUNTER — Ambulatory Visit: Payer: Medicare Other | Admitting: Gastroenterology

## 2018-05-22 LAB — GLUCOSE, CAPILLARY
Glucose-Capillary: 93 mg/dL (ref 70–99)
Glucose-Capillary: 62 mg/dL — ABNORMAL LOW (ref 70–99)
Glucose-Capillary: 92 mg/dL (ref 70–99)
Glucose-Capillary: 70 mg/dL (ref 70–99)
Glucose-Capillary: 73 mg/dL (ref 70–99)
Glucose-Capillary: 75 mg/dL (ref 70–99)

## 2018-05-22 LAB — CBC
HCT: 24.4 % — ABNORMAL LOW (ref 39.0–52.0)
Hemoglobin: 7.6 g/dL — ABNORMAL LOW (ref 13.0–17.0)
MCH: 29.7 pg (ref 26.0–34.0)
MCHC: 31.1 g/dL (ref 30.0–36.0)
MCV: 95.3 fL (ref 80.0–100.0)
PLATELETS: 32 10*3/uL — AB (ref 150–400)
RBC: 2.56 MIL/uL — ABNORMAL LOW (ref 4.22–5.81)
RDW: 19.9 % — AB (ref 11.5–15.5)
WBC: 2.6 10*3/uL — ABNORMAL LOW (ref 4.0–10.5)
nRBC: 0 % (ref 0.0–0.2)

## 2018-05-22 LAB — IRON AND TIBC
Iron: 53 ug/dL (ref 45–182)
Saturation Ratios: 20 % (ref 17.9–39.5)
TIBC: 267 ug/dL (ref 250–450)
UIBC: 214 ug/dL

## 2018-05-22 LAB — RENAL FUNCTION PANEL
Albumin: 3.3 g/dL — ABNORMAL LOW (ref 3.5–5.0)
Anion gap: 7 (ref 5–15)
BUN: 42 mg/dL — ABNORMAL HIGH (ref 8–23)
CO2: 20 mmol/L — ABNORMAL LOW (ref 22–32)
CREATININE: 3.55 mg/dL — AB (ref 0.61–1.24)
Calcium: 9 mg/dL (ref 8.9–10.3)
Chloride: 107 mmol/L (ref 98–111)
GFR calc non Af Amer: 15 mL/min — ABNORMAL LOW (ref >60)
GFR, EST AFRICAN AMERICAN: 17 mL/min — AB (ref >60)
Glucose, Bld: 68 mg/dL — ABNORMAL LOW (ref 70–99)
Phosphorus: 4 mg/dL (ref 2.5–4.6)
Potassium: 5.3 mmol/L — ABNORMAL HIGH (ref 3.5–5.1)
Sodium: 134 mmol/L — ABNORMAL LOW (ref 135–145)

## 2018-05-22 LAB — FERRITIN: FERRITIN: 58 ng/mL (ref 24–336)

## 2018-05-22 LAB — PATHOLOGIST SMEAR REVIEW

## 2018-05-22 MED ORDER — FUROSEMIDE 10 MG/ML IJ SOLN
80.0000 mg | Freq: Once | INTRAMUSCULAR | Status: AC
  Administered 2018-05-22: 80 mg via INTRAVENOUS
  Filled 2018-05-22: qty 8

## 2018-05-22 MED ORDER — ALBUMIN HUMAN 25 % IV SOLN
25.0000 g | Freq: Once | INTRAVENOUS | Status: AC
  Administered 2018-05-22: 25 g via INTRAVENOUS
  Filled 2018-05-22: qty 100

## 2018-05-22 NOTE — Progress Notes (Signed)
Patient is active with [facility] as prior to admission; pt/ family is refusing SNF placement at this time; DCP to resume services with [facility] at discharge; CM will continue to follow for progression of care. Mindi Slicker [facility] [phone]

## 2018-05-22 NOTE — Progress Notes (Signed)
PROGRESS NOTE    Gary Ingram.  CHE:[id number] DOB: [birth year] DOA: 05/19/2018 PCP: Marton Redwood, MD  Brief Narrative: This is an 85 year old chronically ill male with history of cryptogenic cirrhosis, chronic kidney disease stage IV, type 2 diabetes, pancytopenia, hypertension, diabetes mellitus was hospitalized 3 weeks ago with upper GI bleed secondary to variceal bleed, treated with banding and transfused 3 units of PRBC, subsequently discharged home in a stable condition, brought to the ED by family yesterday with  Fall, no loss of consciousness. -Upon evaluation in the emergency room he was noted to have extensive edema extending to his scrotum including abdominal wall swelling, also found to have worsening creatinine to 3.4   Assessment & Plan:   Acute on chronic kidney disease stage IV with anasarca third spacing, in the background of cryptogenic cirrhosis -Appreciate nephrology input, suspected to be hemodynamically mediated, hepatorenal syndrome appears less likely -Being diuresed with Lasix and albumin due to significant third spacing/hypoalbuminemia -continue midodrine  -Diuresed 2 L yesterday, renal function is stable  -Discussed with the patient and family that patient would not be a good dialysis candidate, recommended consideration of DNR -Renal ultrasound without hydronephrosis -We will consider palliative care evaluation if deteriorates -Overall prognosis is poor  Cryptogenic cirrhosis with portal hypertension, recent variceal bleed status post recent banding -As above, continue Protonix -Hb has trended down a bit, no active bleeding, anemia panel suggestive of chronic disease, and recent GI bleed contributing -I also suspect his volume overloaded state is causing some degree of hemodilution -Monitor with diuresis may need transfusion if it trends down further -Ammonia level is within normal limits  Sinus bradycardia -Beta-blocker on hold  Right foot great toe  wound -Recent debridement, complete recent course of doxycycline, stop after tomorrow's dose  Diabetes mellitus type 2 -Sliding scale insulin  Pancytopenia -Due to splenomegaly and splenic sequestration from cirrhosis -Monitor  Hypothyroidism -Started Synthroid  DVT prophylaxis: SCDs Code Status: Full code, strongly recommended consideration of DNR Family Communication: Son and wife Disposition Plan: To be determined, depending on how kidney function trends  Consultants:   Nephrology   Procedures:   Antimicrobials:    Subjective: -Remains weak and tired, sleeping most of the day per family  Objective: Vitals:   05/21/18 1938 05/22/18 0056 05/22/18 0407 05/22/18 1154  BP: (!) 108/54 (!) 102/51 128/66 140/67  Pulse: 79 92 92 81  Resp: 18 18 16 18   Temp: (!) 97.3 F (36.3 C) (!) 97.4 F (36.3 C) (!) 97.3 F (36.3 C) (!) 97.4 F (36.3 C)  TempSrc: Oral Oral Oral Oral  SpO2: 100% 93% 95% 95%  Weight:   97.5 kg   Height:        Intake/Output Summary (Last 24 hours) at 05/22/2018 1311 Last data filed at 05/22/2018 5361 Gross per 24 hour  Intake 840 ml  Output 2175 ml  Net -1335 ml   Filed Weights   05/20/18 0122 05/21/18 0334 05/22/18 0407  Weight: 94.4 kg 93.4 kg 97.5 kg    Examination:  Gen: Awake, Alert, Oriented X 2, chronically Ill-appearing elderly male HEENT: PERRLA, Neck supple, no JVD Lungs: Decreased breath sounds at both bases CVS: RRR,No Gallops,Rubs or new Murmurs Abd: Soft, nontender, mildly distended, abdominal wall edema, scrotal edema extremities: 2+ edema Skin:  Right foot great toe wound with dressing Psychiatry: Flat affect    Data Reviewed:   CBC: Recent Labs  Lab 05/19/18 2056 05/20/18 0343 05/21/18 0818 05/22/18 0528  WBC 4.8 3.9* 2.4* 2.6*  NEUTROABS  --  3.3  --   --   HGB 9.3* 8.6* 7.6* 7.6*  HCT 29.2* 27.9* 24.1* 24.4*  MCV 96.1 96.2 94.9 95.3  PLT 52* 41* 30* 32*   Basic Metabolic Panel: Recent Labs  Lab  05/19/18 2056 05/20/18 0343 05/21/18 0818 05/22/18 0528  NA 132* 133* 134* 134*  K 5.2* 5.0 5.2* 5.3*  CL 107 108 108 107  CO2 17* 16* 19* 20*  GLUCOSE 79 53* 56* 68*  BUN 42* 43* 41* 42*  CREATININE 3.44* 3.46* 3.55* 3.55*  CALCIUM 8.6* 8.6* 8.5* 9.0  MG  --  1.5*  --   --   PHOS  --   --   --  4.0   GFR: Estimated Creatinine Clearance: 17.8 mL/min (A) (by C-G formula based on SCr of 3.55 mg/dL (H)). Liver Function Tests: Recent Labs  Lab 05/19/18 2056 05/20/18 0343 05/21/18 0818 05/22/18 0528  AST 64* 63* 53*  --   ALT 26 24 23   --   ALKPHOS 52 44 40  --   BILITOT 0.6 1.1 0.8  --   PROT 5.2* 4.8* 5.1*  --   ALBUMIN 2.5* 2.4* 3.0* 3.3*   No results for input(s): LIPASE, AMYLASE in the last 168 hours. Recent Labs  Lab 05/21/18 1010  AMMONIA 38*   Coagulation Profile: Recent Labs  Lab 05/19/18 2056  INR 1.48   Cardiac Enzymes: Recent Labs  Lab 05/20/18 0343  TROPONINI <0.03   BNP (last 3 results) No results for input(s): PROBNP in the last 8760 hours. HbA1C: No results for input(s): HGBA1C in the last 72 hours. CBG: Recent Labs  Lab 05/21/18 2123 05/22/18 0152 05/22/18 0750 05/22/18 0815 05/22/18 1116  GLUCAP 72 93 62* 92 70   Lipid Profile: No results for input(s): CHOL, HDL, LDLCALC, TRIG, CHOLHDL, LDLDIRECT in the last 72 hours. Thyroid Function Tests: Recent Labs    05/20/18 0343  TSH 7.368*   Anemia Panel: Recent Labs    05/22/18 0859  FERRITIN 58  TIBC 267  IRON 53   Urine analysis:    Component Value Date/Time   COLORURINE STRAW (A) 05/20/2018 0506   APPEARANCEUR CLEAR 05/20/2018 0506   LABSPEC 1.004 (L) 05/20/2018 0506   PHURINE 5.0 05/20/2018 0506   GLUCOSEU NEGATIVE 05/20/2018 0506   HGBUR NEGATIVE 05/20/2018 0506   BILIRUBINUR NEGATIVE 05/20/2018 0506   KETONESUR NEGATIVE 05/20/2018 0506   PROTEINUR NEGATIVE 05/20/2018 0506   NITRITE NEGATIVE 05/20/2018 0506   LEUKOCYTESUR NEGATIVE 05/20/2018 0506   Sepsis  Labs: @LABRCNTIP (procalcitonin:4,lacticidven:4)  )No results found for this or any previous visit (from the past 240 hour(s)).       Radiology Studies: US Renal  Result Date: 05/20/2018 CLINICAL DATA:  Acute renal injury EXAM: RENAL / URINARY TRACT ULTRASOUND COMPLETE COMPARISON:  None. FINDINGS: Right Kidney: Renal measurements: 9.3 x 3.7 x 4.4 cm = volume: 78.8 mL . Echogenicity within normal limits. No mass or hydronephrosis visualized. Left Kidney: Renal measurements: 9.1 x 4.0 x 4.4 cm = volume: 84.0 mL. Echogenicity within normal limits. No mass or hydronephrosis visualized. Bladder: Appears normal for degree of bladder distention. IMPRESSION: 1. Nodular liver consistent with cirrhosis. Bilateral pleural effusions and significant ascites. 2. No renal abnormalities. Electronically Signed   By: Dorise Bullion III M.D   On: 05/20/2018 15:11        Scheduled Meds: . doxycycline  100 mg Oral BID  . feeding supplement (ENSURE ENLIVE)  237 mL Oral BID BM  .  furosemide  80 mg Intravenous Once  . insulin aspart  0-9 Units Subcutaneous TID WC  . iron polysaccharides  150 mg Oral Daily  . levothyroxine  50 mcg Oral Q0600  . midodrine  5 mg Oral TID WC  . multivitamin with minerals  1 tablet Oral q morning - 10a  . pantoprazole  40 mg Oral BID  . simvastatin  20 mg Oral q1800   Continuous Infusions: . albumin human       LOS: 2 days    Time spent: 53min    Domenic Polite, MD [facility] Page via [email or web address], password [email or web address] After 7PM please contact night-coverage  05/22/2018, 1:11 PM

## 2018-05-22 NOTE — Progress Notes (Signed)
Physical Therapy Treatment Patient Details Name: Gary Ingram. MRN: [id number] DOB: 11/26/1932 Today's Date: 05/22/2018    History of Present Illness Pt is an 85 y.o. male admitted 05/19/18 with progressive weakness and fall. Worked up for AKI on CKD IV with anasarca; pt also with cryptogenic cirrhosis (recent admission with variceal bleed requiring banding). PMH includes R toe wound (recent debridement), CKD, DM2, HTN, DM.   PT Comments    Family and patient declining SNF. Pt currently requiring mod-maxA+2 for transfers and initiating gait with RW. Pt limited by fatigue, generalized weakness, and decreased cognition. Son present and hopeful that [facility] services will be able to provide adequate physical assist for pt's mobility at home; reports he will be providing assist at night. Son able to participate in session to see pt's current physical capabilities. I understand family's concern for SNF, but they also seem to have a poor grasp on extent of assist pt will actually require upon return home. Pt currently dependent for ADLs. If to return home, will require max HH services and DME (see below). VSS this session.  BPs  Supine 139/67  Sitting (pt symptomatic) 135/69  Post-transfer 124/61      Follow Up Recommendations  SNF;Supervision for mobility/OOB(refusing SNF, will need max HH services)     Equipment Recommendations  Hospital bed(hoyer lift)    Recommendations for Other Services       Precautions / Restrictions Precautions Precautions: Fall;Other (comment) Precaution Comments: Bowel incontinence Restrictions Weight Bearing Restrictions: No    Mobility  Bed Mobility Overal bed mobility: Needs Assistance Bed Mobility: Supine to Sit     Supine to sit: Mod assist;HOB elevated     General bed mobility comments: ModA to assist bilateral hips to EOB; significant increased time and effort, pt required cues for all sequencing including use of hand rail. Pt slow to follow  commands, required continue cues to attend to task  Transfers Overall transfer level: Needs assistance Equipment used: Rolling walker (2 wheeled) Transfers: Sit to/from Stand Sit to Stand: Mod assist;Max assist;+2 physical assistance;+2 safety/equipment         General transfer comment: ModA+2 to stand from bed with bilateral HHA to RW; maxA to block bilateral feet from sliding as pt extends knees and trunk when standing. MaxA+2 standing from recliner; pt requires multimodal cues for sequencing every detail to stand  Ambulation/Gait Ambulation/Gait assistance: Mod assist;+2 physical assistance Gait Distance (Feet): 3 Feet Assistive device: Rolling walker (2 wheeled) Gait Pattern/deviations: Step-to pattern;Trunk flexed;Leaning posteriorly Gait velocity: Decreased Gait velocity interpretation: <1.31 ft/sec, indicative of household ambulator General Gait Details: Took steps with RW from bed to recliner with RW and modA+2; multimodal cues for sequencing, including assist to move RW. Frequent cues to maintain upright posture; cues to keep from sitting prematurely. Bowel incontinence    Stairs             Wheelchair Mobility    Modified Rankin (Stroke Patients Only)       Balance Overall balance assessment: Needs assistance;History of Falls Sitting-balance support: No upper extremity supported Sitting balance-Leahy Scale: Fair     Standing balance support: Bilateral upper extremity supported Standing balance-Leahy Scale: Poor                              Cognition Arousal/Alertness: Awake/alert Behavior During Therapy: Flat affect Overall Cognitive Status: Impaired/Different from baseline Area of Impairment: Attention;Following commands;Safety/judgement;Problem solving;Awareness  Current Attention Level: Sustained   Following Commands: Follows one step commands with increased time;Follows one step commands  inconsistently Safety/Judgement: Decreased awareness of deficits;Decreased awareness of safety Awareness: Intellectual Problem Solving: Slow processing;Decreased initiation;Difficulty sequencing;Requires verbal cues        Exercises      General Comments General comments (skin integrity, edema, etc.): Wife and son present, family and pt refusing SNF. Increased time spent discussing amount of assist pt currently needing. Son thinking HH RN/aide during day will be adequate, reports he will help at night; son able to assist PT in working with pt this session in order to see how much assistance pt currently needing, son still confident he can do this with his mom's help. Wife may be able to help with some ADLs, but not able to provide physical assist      Pertinent Vitals/Pain Pain Assessment: No/denies pain    Home Living                      Prior Function            PT Goals (current goals can now be found in the care plan section) Acute Rehab PT Goals Patient Stated Goal: Pt and wife want him to return home at d/c PT Goal Formulation: With patient/family Time For Goal Achievement: 06/03/18 Potential to Achieve Goals: Poor Progress towards PT goals: Progressing toward goals    Frequency    Min 3X/week      PT Plan Frequency needs to be updated    Co-evaluation              AM-PAC PT "6 Clicks" Mobility   Outcome Measure  Help needed turning from your back to your side while in a flat bed without using bedrails?: A Little Help needed moving from lying on your back to sitting on the side of a flat bed without using bedrails?: A Lot Help needed moving to and from a bed to a chair (including a wheelchair)?: A Lot Help needed standing up from a chair using your arms (e.g., wheelchair or bedside chair)?: A Lot Help needed to walk in hospital room?: A Lot Help needed climbing 3-5 steps with a railing? : Total 6 Click Score: 12    End of Session Equipment  Utilized During Treatment: Gait belt Activity Tolerance: Patient tolerated treatment well;Patient limited by fatigue Patient left: in chair;with call bell/phone within reach;with family/visitor present;with chair alarm set Nurse Communication: Mobility status;Need for lift equipment PT Visit Diagnosis: Other abnormalities of gait and mobility (R26.89);Muscle weakness (generalized) (M62.81)     Time: 3358-2518 PT Time Calculation (min) (ACUTE ONLY): 33 min  Charges:  $Therapeutic Activity: 8-22 mins $Self Care/Home Management: 8-22                    Gary Ingram, PT, DPT Acute Rehabilitation Services  Pager [phone] Office [phone]  Gary Ingram 05/22/2018, 4:11 PM

## 2018-05-22 NOTE — Progress Notes (Signed)
Patient ID: Gary Ingram., male   DOB: 14-Jan-1933, 85 y.o.   MRN: [id number]  [facility] Progress Note   Assessment/ Plan:   1.  Acute kidney injury on chronic kidney disease stage IV: Nonoliguric, likely hemodynamically mediated acute kidney injury without clear evidence of HRS.  Overnight with decent response to combination of albumin and furosemide that I will repeat again.  Renal function essentially unchanged overnight. 2.  Hyponatremia: Secondary to acute kidney injury and cirrhosis with free water excretion defect.  Monitor with diuresis. 3.  Hyperkalemia: Mild, monitor with ongoing diuresis and restrict oral potassium intake. 4.  Anemia: Secondary to recent GI bleed and chronic illness.    Check iron studies today. 5.  History of cryptogenic cirrhosis 6.  Malnutrition/debilitation  Subjective:   His son reports that the patient slept all day and stayed awake all night.  Some dizziness noted this morning.   Objective:   BP 128/66 (BP Location: Right Arm)   Pulse 92   Temp (!) 97.3 F (36.3 C) (Oral)   Resp 16   Ht 5\' 8"  (1.727 m)   Wt 97.5 kg   SpO2 95%   BMI 32.68 kg/m   Intake/Output Summary (Last 24 hours) at 05/22/2018 0843 Last data filed at 05/22/2018 2376 Gross per 24 hour  Intake 1032.09 ml  Output 2425 ml  Net -1392.91 ml   Weight change: 4.1 kg  Physical Exam: Gen: Comfortably resting in bed, wife and son at bedside (patient is hard of hearing). CVS: Pulse regular rhythm, normal rate, S1 and S2 normal Resp: Anteriorly clear to auscultation, diminished breath sounds over bases Abd: Soft, obese, nontender Ext: 2+ lower extremity edema  Imaging: US Renal  Result Date: 05/20/2018 CLINICAL DATA:  Acute renal injury EXAM: RENAL / URINARY TRACT ULTRASOUND COMPLETE COMPARISON:  None. FINDINGS: Right Kidney: Renal measurements: 9.3 x 3.7 x 4.4 cm = volume: 78.8 mL . Echogenicity within normal limits. No mass or hydronephrosis visualized. Left  Kidney: Renal measurements: 9.1 x 4.0 x 4.4 cm = volume: 84.0 mL. Echogenicity within normal limits. No mass or hydronephrosis visualized. Bladder: Appears normal for degree of bladder distention. IMPRESSION: 1. Nodular liver consistent with cirrhosis. Bilateral pleural effusions and significant ascites. 2. No renal abnormalities. Electronically Signed   By: Dorise Bullion III M.D   On: 05/20/2018 15:11    Labs: BMET Recent Labs  Lab 05/19/18 2056 05/20/18 0343 05/21/18 0818 05/22/18 0528  NA 132* 133* 134* 134*  K 5.2* 5.0 5.2* 5.3*  CL 107 108 108 107  CO2 17* 16* 19* 20*  GLUCOSE 79 53* 56* 68*  BUN 42* 43* 41* 42*  CREATININE 3.44* 3.46* 3.55* 3.55*  CALCIUM 8.6* 8.6* 8.5* 9.0  PHOS  --   --   --  4.0   CBC Recent Labs  Lab 05/19/18 2056 05/20/18 0343 05/21/18 0818 05/22/18 0528  WBC 4.8 3.9* 2.4* 2.6*  NEUTROABS  --  3.3  --   --   HGB 9.3* 8.6* 7.6* 7.6*  HCT 29.2* 27.9* 24.1* 24.4*  MCV 96.1 96.2 94.9 95.3  PLT 52* 41* 30* 32*   Medications:    . doxycycline  100 mg Oral BID  . feeding supplement (ENSURE ENLIVE)  237 mL Oral BID BM  . furosemide  80 mg Intravenous Once  . furosemide  80 mg Intravenous Once  . insulin aspart  0-9 Units Subcutaneous TID WC  . iron polysaccharides  150 mg Oral Daily  . levothyroxine  50 mcg Oral Q0600  . midodrine  5 mg Oral TID WC  . multivitamin with minerals  1 tablet Oral q morning - 10a  . pantoprazole  40 mg Oral BID  . simvastatin  20 mg Oral q1800   Elmarie Shiley, MD 05/22/2018, 8:43 AM

## 2018-05-22 NOTE — Care Management Important Message (Signed)
Important Message  Patient Details  Name: Gary Ingram. MRN: [id number] Date of Birth: Apr 03, 1933   Medicare Important Message Given:  Yes    Tamiya Colello P Jovanna Hodges 05/22/2018, 11:07 AM

## 2018-05-23 ENCOUNTER — Inpatient Hospital Stay (HOSPITAL_COMMUNITY): Payer: Medicare Other

## 2018-05-23 LAB — CBC
HCT: 24.9 % — ABNORMAL LOW (ref 39.0–52.0)
Hemoglobin: 7.8 g/dL — ABNORMAL LOW (ref 13.0–17.0)
MCH: 29.5 pg (ref 26.0–34.0)
MCHC: 31.3 g/dL (ref 30.0–36.0)
MCV: 94.3 fL (ref 80.0–100.0)
PLATELETS: 29 10*3/uL — AB (ref 150–400)
RBC: 2.64 MIL/uL — ABNORMAL LOW (ref 4.22–5.81)
RDW: 20.2 % — ABNORMAL HIGH (ref 11.5–15.5)
WBC: 3.1 10*3/uL — ABNORMAL LOW (ref 4.0–10.5)
nRBC: 0 % (ref 0.0–0.2)

## 2018-05-23 LAB — GLUCOSE, CAPILLARY
GLUCOSE-CAPILLARY: 69 mg/dL — AB (ref 70–99)
Glucose-Capillary: 89 mg/dL (ref 70–99)
Glucose-Capillary: 70 mg/dL (ref 70–99)
Glucose-Capillary: 72 mg/dL (ref 70–99)
Glucose-Capillary: 79 mg/dL (ref 70–99)

## 2018-05-23 LAB — RENAL FUNCTION PANEL
Albumin: 3.5 g/dL (ref 3.5–5.0)
Anion gap: 8 (ref 5–15)
BUN: 42 mg/dL — ABNORMAL HIGH (ref 8–23)
CO2: 21 mmol/L — ABNORMAL LOW (ref 22–32)
Calcium: 9.3 mg/dL (ref 8.9–10.3)
Chloride: 107 mmol/L (ref 98–111)
Creatinine, Ser: 3.61 mg/dL — ABNORMAL HIGH (ref 0.61–1.24)
GFR calc non Af Amer: 15 mL/min — ABNORMAL LOW (ref >60)
GFR, EST AFRICAN AMERICAN: 17 mL/min — AB (ref >60)
Glucose, Bld: 101 mg/dL — ABNORMAL HIGH (ref 70–99)
Phosphorus: 3.8 mg/dL (ref 2.5–4.6)
Potassium: 4.7 mmol/L (ref 3.5–5.1)
Sodium: 136 mmol/L (ref 135–145)

## 2018-05-23 MED ORDER — ALBUMIN HUMAN 25 % IV SOLN
25.0000 g | Freq: Once | INTRAVENOUS | Status: AC
  Administered 2018-05-23: 25 g via INTRAVENOUS
  Filled 2018-05-23 (×3): qty 100

## 2018-05-23 MED ORDER — TORSEMIDE 20 MG PO TABS
40.0000 mg | ORAL_TABLET | Freq: Two times a day (BID) | ORAL | Status: DC
  Administered 2018-05-23 – 2018-05-24 (×3): 40 mg via ORAL
  Filled 2018-05-23 (×4): qty 2

## 2018-05-23 MED ORDER — STARCH (THICKENING) PO POWD
ORAL | Status: DC | PRN
  Administered 2018-05-27: 18:00:00 via ORAL
  Filled 2018-05-23 (×2): qty 227

## 2018-05-23 MED ORDER — FUROSEMIDE 10 MG/ML IJ SOLN
80.0000 mg | Freq: Once | INTRAMUSCULAR | Status: AC
  Administered 2018-05-23: 80 mg via INTRAVENOUS
  Filled 2018-05-23: qty 8

## 2018-05-23 NOTE — Progress Notes (Signed)
PROGRESS NOTE    [name].  QJJ:[id number] DOB: 07-29-1932 DOA: 05/19/2018 PCP: Marton Redwood, MD  Brief Narrative: This is an 85 year old chronically ill male with history of cryptogenic cirrhosis, chronic kidney disease stage IV, type 2 diabetes, pancytopenia, hypertension, diabetes mellitus was hospitalized 3 weeks ago with upper GI bleed secondary to variceal bleed, treated with banding and transfused 3 units of PRBC, subsequently discharged home in a stable condition, brought to the ED by family yesterday with  Fall, no loss of consciousness. -Upon evaluation in the emergency room he was noted to have extensive edema extending to his scrotum including abdominal wall swelling, also found to have worsening creatinine to 3.4 -Nephrology consulting, started on Lasix and albumin  Assessment & Plan:   Acute on chronic kidney disease stage IV with anasarca third spacing, in the background of cryptogenic cirrhosis -Appreciate nephrology input, suspected to be hemodynamically mediated, hepatorenal syndrome appears less likely -Renal ultrasound without hydronephrosis -Diuresing well on regimen of Lasix and albumin, urine output was 4 L yesterday -Diuretics per renal, creatinine is stable -Overall prognosis is poor given advanced kidney disease, hypoalbuminemia, third spacing and stage IV kidney disease, discussed this situation with patient and family they understand that he would not be considered a dialysis candidate, attempted multiple discussions on CODE STATUS which family has avoided, palliative care consulted for goals of care  Cryptogenic cirrhosis with portal hypertension, recent variceal bleed status post recent banding -As above, continue Protonix -Hb has trended down a bit, no active bleeding, anemia panel suggestive of chronic disease, and recent GI bleed contributing -I also suspect his volume overloaded state is causing some degree of hemodilution -Monitor with diuresis may  need transfusion if it trends down further -Ammonia level is within normal limits  Sinus bradycardia -Beta-blocker on hold  Right foot great toe wound -Recent debridement, complete recent course of doxycycline, discontinue this today  Diabetes mellitus type 2 -Sliding scale insulin  Pancytopenia/thrombocytopenia -Platelet count stable in the 30,000 range slightly worse from recent hospitalization, this is secondary to cirrhosis/splenic sequestration -Monitor  Hypothyroidism -Started Synthroid  DVT prophylaxis: SCDs Code Status: Full code, strongly recommended consideration of DNR Family Communication: Son and wife Disposition Plan: To be determined  Consultants:   Nephrology  Palliative medicine   Procedures:   Antimicrobials:    Subjective: -Eating a little better, feels weak and tired all day, sleeps most of the day  Objective: Vitals:   05/22/18 2004 05/23/18 0636 05/23/18 0636 05/23/18 0637  BP: 132/64 113/65 113/65   Pulse: 79 67 70   Resp: 18 16 16    Temp: (!) 97.4 F (36.3 C) (!) 97.4 F (36.3 C) (!) 97.4 F (36.3 C)   TempSrc: Oral     SpO2: 94% 98% 98%   Weight:    90.8 kg  Height:        Intake/Output Summary (Last 24 hours) at 05/23/2018 1103 Last data filed at 05/23/2018 1000 Gross per 24 hour  Intake 860 ml  Output 4502 ml  Net -3642 ml   Filed Weights   05/21/18 0334 05/22/18 0407 05/23/18 0637  Weight: 93.4 kg 97.5 kg 90.8 kg    Examination:  Gen: Elderly frail chronically ill-appearing gentleman, sitting up in bed, awake, Alert, Oriented X 3,  HEENT: PERRLA, Neck supple, no JVD Lungs: Decreased breath sounds at both bases CVS: RRR,No Gallops,Rubs or new Murmurs Abd: Soft, mildly distended, nontender, fluid thrill present, bowel sounds present, scrotal edema improving Extremities: 2+ lower extremity edema  extending to upper thigh and lower abdomen Skin: Right foot great toe wound with dressing Psychiatry: Flat affect    Data  Reviewed:   CBC: Recent Labs  Lab 05/19/18 2056 05/20/18 0343 05/21/18 0818 05/22/18 0528 05/23/18 0806  WBC 4.8 3.9* 2.4* 2.6* 3.1*  NEUTROABS  --  3.3  --   --   --   HGB 9.3* 8.6* 7.6* 7.6* 7.8*  HCT 29.2* 27.9* 24.1* 24.4* 24.9*  MCV 96.1 96.2 94.9 95.3 94.3  PLT 52* 41* 30* 32* 29*   Basic Metabolic Panel: Recent Labs  Lab 05/19/18 2056 05/20/18 0343 05/21/18 0818 05/22/18 0528 05/23/18 0431  NA 132* 133* 134* 134* 136  K 5.2* 5.0 5.2* 5.3* 4.7  CL 107 108 108 107 107  CO2 17* 16* 19* 20* 21*  GLUCOSE 79 53* 56* 68* 101*  BUN 42* 43* 41* 42* 42*  CREATININE 3.44* 3.46* 3.55* 3.55* 3.61*  CALCIUM 8.6* 8.6* 8.5* 9.0 9.3  MG  --  1.5*  --   --   --   PHOS  --   --   --  4.0 3.8   GFR: Estimated Creatinine Clearance: 17 mL/min (A) (by C-G formula based on SCr of 3.61 mg/dL (H)). Liver Function Tests: Recent Labs  Lab 05/19/18 2056 05/20/18 0343 05/21/18 0818 05/22/18 0528 05/23/18 0431  AST 64* 63* 53*  --   --   ALT 26 24 23   --   --   ALKPHOS 52 44 40  --   --   BILITOT 0.6 1.1 0.8  --   --   PROT 5.2* 4.8* 5.1*  --   --   ALBUMIN 2.5* 2.4* 3.0* 3.3* 3.5   No results for input(s): LIPASE, AMYLASE in the last 168 hours. Recent Labs  Lab 05/21/18 1010  AMMONIA 38*   Coagulation Profile: Recent Labs  Lab 05/19/18 2056  INR 1.48   Cardiac Enzymes: Recent Labs  Lab 05/20/18 0343  TROPONINI <0.03   BNP (last 3 results) No results for input(s): PROBNP in the last 8760 hours. HbA1C: No results for input(s): HGBA1C in the last 72 hours. CBG: Recent Labs  Lab 05/22/18 1639 05/22/18 2116 05/23/18 0249 05/23/18 0427 05/23/18 0838  GLUCAP 73 75 69* 89 70   Lipid Profile: No results for input(s): CHOL, HDL, LDLCALC, TRIG, CHOLHDL, LDLDIRECT in the last 72 hours. Thyroid Function Tests: No results for input(s): TSH, T4TOTAL, FREET4, T3FREE, THYROIDAB in the last 72 hours. Anemia Panel: Recent Labs    05/22/18 0859  FERRITIN 58  TIBC  267  IRON 53   Urine analysis:    Component Value Date/Time   COLORURINE STRAW (A) 05/20/2018 0506   APPEARANCEUR CLEAR 05/20/2018 0506   LABSPEC 1.004 (L) 05/20/2018 0506   PHURINE 5.0 05/20/2018 0506   GLUCOSEU NEGATIVE 05/20/2018 0506   HGBUR NEGATIVE 05/20/2018 0506   BILIRUBINUR NEGATIVE 05/20/2018 0506   KETONESUR NEGATIVE 05/20/2018 0506   PROTEINUR NEGATIVE 05/20/2018 0506   NITRITE NEGATIVE 05/20/2018 0506   LEUKOCYTESUR NEGATIVE 05/20/2018 0506   Sepsis Labs: @LABRCNTIP (procalcitonin:4,lacticidven:4)  )No results found for this or any previous visit (from the past 240 hour(s)).       Radiology Studies: No results found.      Scheduled Meds: . feeding supplement (ENSURE ENLIVE)  237 mL Oral BID BM  . insulin aspart  0-9 Units Subcutaneous TID WC  . iron polysaccharides  150 mg Oral Daily  . levothyroxine  50 mcg Oral Q0600  .  midodrine  5 mg Oral TID WC  . multivitamin with minerals  1 tablet Oral q morning - 10a  . pantoprazole  40 mg Oral BID  . simvastatin  20 mg Oral q1800   Continuous Infusions:    LOS: 3 days    Time spent: 87min    Domenic Polite, MD [facility] Page via [email or web address], password [email or web address] After 7PM please contact night-coverage  05/23/2018, 11:03 AM

## 2018-05-23 NOTE — Progress Notes (Signed)
Patient sleeping during shift report.     Family at bedside, verbalizing decrease in strength and appetite; pt is borderline being force-fed. Pt with audible wet breathing, will consider speech/swallow evaluation today.

## 2018-05-23 NOTE — Progress Notes (Addendum)
CRITICAL VALUE ALERT  Critical Value:  Platelets 29  Date & Time Notied: 05/23/18 9:46 AM  Provider Notified: .Joseph,MD  Page to MD: [id number] FYI Platelets still critical low, 29. (30,32 last two CBCs)  Orders Received/Actions taken: none.

## 2018-05-23 NOTE — Evaluation (Signed)
Clinical/Bedside Swallow Evaluation Patient Details  Name: Gary Ingram. MRN: [id number] Date of Birth: Jun 19, 1932  Today's Date: 05/23/2018 Time: SLP Start Time (ACUTE ONLY): 1100 SLP Stop Time (ACUTE ONLY): 1120 SLP Time Calculation (min) (ACUTE ONLY): 20 min  Past Medical History:  Past Medical History:  Diagnosis Date  . Adenomatous colon polyp   . Anemia   . AVM (arteriovenous malformation) of colon   . Chronic kidney disease, stage III (moderate) (HCC)   . Diabetes ([place])   . Dyslipidemia   . Esophageal varices ([place])   . GI bleed   . Helicobacter pylori gastritis   . Hypertension   . Iron deficiency anemia   . Obesity   . Peripheral neuropathy   . Portal hypertension ([place])   . Splenomegaly   . Thrombocytopenia ([place])   . Thrombocytopenia ([place])   . TIA (transient ischemic attack)    ?  Marland Kitchen Vertigo   . Vertigo    Past Surgical History:  Past Surgical History:  Procedure Laterality Date  . COLONOSCOPY N/A 06/10/2013   Procedure: COLONOSCOPY;  Surgeon: Lafayette Dragon, MD;  Location: [facility] ENDOSCOPY;  Service: Endoscopy;  Laterality: N/A;  . ESOPHAGEAL BANDING  04/30/2018   Procedure: ESOPHAGEAL BANDING;  Surgeon: Lavena Bullion, DO;  Location: [facility];  Service: Gastroenterology;;  . ESOPHAGOGASTRODUODENOSCOPY N/A 06/10/2013   Procedure: ESOPHAGOGASTRODUODENOSCOPY (EGD);  Surgeon: Lafayette Dragon, MD;  Location: [facility];  Service: Endoscopy;  Laterality: N/A;  . ESOPHAGOGASTRODUODENOSCOPY (EGD) WITH PROPOFOL N/A 04/30/2018   Procedure: ESOPHAGOGASTRODUODENOSCOPY (EGD) WITH PROPOFOL;  Surgeon: Lavena Bullion, DO;  Location: [facility];  Service: Gastroenterology;  Laterality: N/A;  . TONSILLECTOMY AND ADENOIDECTOMY     HPI:  Pt is an 85 y.o. male admitted 05/19/18 with progressive weakness and fall. Worked up for AKI on CKD IV with anasarca; pt also with cryptogenic cirrhosis (recent admission with variceal bleed requiring banding). PMH includes R toe wound  (recent debridement), CKD, DM2, HTN, DM.   Assessment / Plan / Recommendation Clinical Impression  Patient presents with a moderate oropharyngeal dysphagia with delayed and immediate coughing and throat clearing with thin liquids, swallow initaiton delays and decreased awareness of bolus. Plan for MBS today to determine swallow function and safest diet. SLP Visit Diagnosis: Dysphagia, unspecified (R13.10)    Aspiration Risk  Moderate aspiration risk    Diet Recommendation     Postural Changes: Seated upright at 90 degrees    Other  Recommendations Oral Care Recommendations: Oral care QID   Follow up Recommendations (TBD)      Frequency and Duration min 2x/week  1 week       Prognosis Prognosis for Safe Diet Advancement: Fair      Swallow Study   General Date of Onset: 05/20/18 HPI: Pt is an 85 y.o. male admitted 05/19/18 with progressive weakness and fall. Worked up for AKI on CKD IV with anasarca; pt also with cryptogenic cirrhosis (recent admission with variceal bleed requiring banding). PMH includes R toe wound (recent debridement), CKD, DM2, HTN, DM. Type of Study: Bedside Swallow Evaluation Previous Swallow Assessment: N/A Diet Prior to this Study: Dysphagia 2 (chopped);Thin liquids Temperature Spikes Noted: No History of Recent Intubation: No Behavior/Cognition: Alert;Cooperative;Lethargic/Drowsy Oral Cavity Assessment: Within Functional Limits Oral Care Completed by SLP: Yes Oral Cavity - Dentition: Dentures, top;Edentulous Self-Feeding Abilities: Needs assist Patient Positioning: Upright in bed Baseline Vocal Quality: Normal Volitional Cough: Weak Volitional Swallow: Unable to elicit    Oral/Motor/Sensory Function Overall Oral Motor/Sensory Function: Generalized  oral weakness Facial ROM: Within Functional Limits Facial Symmetry: Within Functional Limits Facial Strength: Reduced right;Reduced left Facial Sensation: Within Functional Limits Lingual Symmetry:  Within Functional Limits Lingual Strength: Reduced   Ice Chips     Thin Liquid Thin Liquid: Impaired Presentation: Cup Oral Phase Impairments: Reduced labial seal Pharyngeal  Phase Impairments: Suspected delayed Swallow;Cough - Delayed;Wet Vocal Quality    Nectar Thick Nectar Thick Liquid: Not tested   Honey Thick Honey Thick Liquid: Not tested   Puree Puree: Impaired Oral Phase Impairments: Reduced lingual movement/coordination Pharyngeal Phase Impairments: Suspected delayed Swallow   Solid     Solid: Not tested      Sonia Baller, MA, CCC-SLP 05/23/18 6:45 PM

## 2018-05-23 NOTE — Progress Notes (Signed)
Page to [facility] pager to request timeline on swallow eval.

## 2018-05-23 NOTE — Progress Notes (Signed)
Pt off the floor for modified swallow study.

## 2018-05-23 NOTE — Progress Notes (Signed)
Modified Barium Swallow Progress Note  Patient Details  Name: Jaeshaun Riva. MRN: [id number] Date of Birth: 1933/02/12  Today's Date: 05/23/2018  Modified Barium Swallow completed.  Full report located under Chart Review in the Imaging Section.  Brief recommendations include the following:  Clinical Impression  Patient presents with a moderate oral and pharyngeal dysphagia characterized by decreased swallow initiation and delays to vallecular sinus; vallecular and pyriform sinus residuals with puree solids that slowly cleared with subsequent swallows and liquids; aspiration during swallow with thin liquids, with cough occuring after liquid had already passed through vocal cords and into trachea.   Swallow Evaluation Recommendations       SLP Diet Recommendations: Dysphagia 1 (Puree) solids;Nectar thick liquid   Liquid Administration via: Spoon;Cup   Medication Administration: Crushed with puree   Supervision: Full supervision/cueing for compensatory strategies;Staff to assist with self feeding   Compensations: Minimize environmental distractions;Slow rate;Small sips/bites   Postural Changes: Seated upright at 90 degrees   Oral Care Recommendations: Oral care BID   Other Recommendations: Order thickener from pharmacy;Prohibited food (jello, ice cream, thin soups);Remove water pitcher;Clarify dietary restrictions   Sonia Baller, MA, CCC-SLP 05/23/18 6:56 PM

## 2018-05-23 NOTE — Evaluation (Signed)
Occupational Therapy Evaluation Patient Details Name: Gary Ingram. MRN: [id number] DOB: January 24, 1933 Today's Date: 05/23/2018    History of Present Illness Pt is an 85 y.o. male admitted 05/19/18 with progressive weakness and fall. Worked up for AKI on CKD IV with anasarca; pt also with cryptogenic cirrhosis (recent admission with variceal bleed requiring banding). PMH includes R toe wound (recent debridement), CKD, DM2, HTN, DM.   Clinical Impression   Pt with decline and function in safety with ADLs and ADL mobility with decreased strength, balance, endurance and cognition. Pt very deconditioned and requires extensive assist for bed mobility to sit EOB and mod A +2 for sit - stand to RW and unable to SPT at this time. Pt requires min A with UB ADLs and total A with LB ADLs. Pt and wife initially refusing [facility] SN[facility] rehab, however after son and daughter present during session to witness how much assist pt needs, they were encouraging pt and thier mother to agree to [facility] SNF. OT spent extensive amount of time educating pt/family on safety concerns for pt and wife at his current level of function of they d/c home vs to [facility] for rehab. Pt agreeable to ST SNF while sitting EOB during activity and pt's wife became tearful. Pt's children and wife asked many questions regarding process to go to SNF and what to expect. OT educated pt/family on process for therapy evaluation/treatment and what is expected and referred family to SW to answer questions/explain further. Notified pt's RN that now agreeable to [facility]. Pt would benefit from acute OT services to maximize level of function and safety    Follow Up Recommendations  SNF(pt agreeable to SNF now, RN and SW notified)    Equipment Recommendations  Other (comment)(TBD at next venue of care)    Recommendations for Other Services       Precautions / Restrictions Precautions Precautions: Fall;Other (comment) Precaution Comments: Bowel  incontinence Restrictions Weight Bearing Restrictions: No      Mobility Bed Mobility Overal bed mobility: Needs Assistance Bed Mobility: Supine to Sit;Sit to Supine     Supine to sit: Mod assist;HOB elevated Sit to supine: Mod assist;+2 for physical assistance   General bed mobility comments: ModA to assist bilateral hips to EOB; significant increased time and effort, pt required cues for all sequencing including use of hand rail. Pt slow to follow commands, required continue cues to attend to task  Transfers Overall transfer level: Needs assistance Equipment used: Rolling walker (2 wheeled) Transfers: Sit to/from Stand Sit to Stand: Mod assist;+2 physical assistance;+2 safety/equipment         General transfer comment: Pt able to stand with RW at EOB for hygiene, unable to SPT    Balance Overall balance assessment: Needs assistance;History of Falls Sitting-balance support: No upper extremity supported Sitting balance-Leahy Scale: Fair     Standing balance support: Bilateral upper extremity supported Standing balance-Leahy Scale: Poor                             ADL either performed or assessed with clinical judgement   ADL Overall ADL's : Needs assistance/impaired Eating/Feeding: Set up;Sitting;Bed level   Grooming: Sitting;Min guard;Wash/dry hands;Wash/dry face   Upper Body Bathing: Minimal assistance;Sitting Upper Body Bathing Details (indicate cue type and reason): EOB Lower Body Bathing: Maximal assistance   Upper Body Dressing : Minimal assistance;Sitting Upper Body Dressing Details (indicate cue type and reason): EOB Lower Body Dressing: Total  assistance     Toilet Transfer Details (indicate cue type and reason): pt unable to SPT Toileting- Clothing Manipulation and Hygiene: Total assistance Toileting - Clothing Manipulation Details (indicate cue type and reason): pt stood at [place] with mod A +2 for staff to provide total A with clothing mgt and  hygiene     Functional mobility during ADLs: Moderate assistance;+2 for safety/equipment;Rolling walker General ADL Comments: pt very deconditioned     Vision Baseline Vision/History: Wears glasses Wears Glasses: At all times Patient Visual Report: No change from baseline       Perception     Praxis      Pertinent Vitals/Pain Pain Assessment: No/denies pain     Hand Dominance Right   Extremity/Trunk Assessment Upper Extremity Assessment Upper Extremity Assessment: Generalized weakness   Lower Extremity Assessment Lower Extremity Assessment: Defer to PT evaluation   Cervical / Trunk Assessment Cervical / Trunk Assessment: Kyphotic   Communication Communication Communication: No difficulties   Cognition Arousal/Alertness: Awake/alert Behavior During Therapy: WFL for tasks assessed/performed Overall Cognitive Status: Impaired/Different from baseline Area of Impairment: Attention;Following commands;Safety/judgement;Problem solving;Awareness                       Following Commands: Follows one step commands with increased time;Follows one step commands inconsistently Safety/Judgement: Decreased awareness of deficits;Decreased awareness of safety   Problem Solving: Slow processing;Decreased initiation;Difficulty sequencing;Requires verbal cues     General Comments       Exercises     Shoulder Instructions      Home Living Family/patient expects to be discharged to:: Skilled nursing facility Living Arrangements: Spouse/significant other;Children Available Help at Discharge: Family;Available 24 hours/day Type of Home: House Home Access: Stairs to enter [employer] of Steps: 4 Entrance Stairs-Rails: Right;Left;Can reach both           Bathroom Toilet: Standard     Home Equipment: Environmental consultant - 2 wheels;Cane - single point;Bedside commode;Walker - standard   Additional Comments: Lives with wife who has own physical problems. Son available  for assist as needed      Prior Functioning/Environment Level of Independence: Needs assistance  Gait / Transfers Assistance Needed: Ambulatory with standard walker ADL's / Homemaking Assistance Needed: Wife assists with all ADLs, including bathroom, bathing, dressing   Comments: ambulates with canes, drives and grocery shops        OT Problem List: Decreased strength;Decreased range of motion;Impaired balance (sitting and/or standing);Decreased cognition;Decreased knowledge of use of DME or AE;Decreased activity tolerance      OT Treatment/Interventions:      OT Goals(Current goals can be found in the care plan section) Acute Rehab OT Goals Patient Stated Goal: "I'll give rehab a try now" OT Goal Formulation: With patient/family Time For Goal Achievement: 06/06/18 Potential to Achieve Goals: Good ADL Goals Pt Will Perform Grooming: with min guard assist;with supervision;with set-up;sitting Pt Will Perform Upper Body Bathing: with min guard assist;sitting Pt Will Perform Lower Body Bathing: with mod assist;sitting/lateral leans Pt Will Transfer to Toilet: with max assist;with mod assist;stand pivot transfer;bedside commode  OT Frequency: Min 2X/week   Barriers to D/C: Decreased caregiver support  pt's children would like for pt to got o SNF for rehab. Pt's wife unrealistically believes she can privde current level of care at home       Co-evaluation              AM-PAC OT "6 Clicks" Daily Activity     Outcome Measure Help from  another person eating meals?: None Help from another person taking care of personal grooming?: A Little Help from another person toileting, which includes using toliet, bedpan, or urinal?: Total Help from another person bathing (including washing, rinsing, drying)?: Total Help from another person to put on and taking off regular upper body clothing?: A Little Help from another person to put on and taking off regular lower body clothing?:  Total 6 Click Score: 13   End of Session Equipment Utilized During Treatment: Gait belt;Rolling walker  Activity Tolerance: Patient limited by fatigue Patient left: in bed;with call bell/phone within reach;with family/visitor present  OT Visit Diagnosis: Unsteadiness on feet (R26.81);Repeated falls (R29.6);History of falling (Z91.81);Other symptoms and signs involving cognitive function                Time: 0901-1007 OT Time Calculation (min): 66 min Charges:  OT General Charges $OT Visit: 1 Visit OT Evaluation $OT Eval Moderate Complexity: 1 Mod OT Treatments $Self Care/Home Management : 8-22 mins $Therapeutic Activity: 8-22 mins    Britt Bottom 05/23/2018, 11:32 AM

## 2018-05-23 NOTE — Progress Notes (Signed)
Patient ID: Gary Palin., male   DOB: 1933-01-18, 85 y.o.   MRN: [id number]  [facility] Progress Note   Assessment/ Plan:   1.  Acute kidney injury on chronic kidney disease stage IV: Nonoliguric, likely hemodynamically mediated acute kidney injury without clear evidence of HRS.  Continues to have decent response to albumin/furosemide which I will order this a.m. and then begin oral diuretics beginning this evening to try and get him on a regimen that he can be discharged home on.  I reiterated to both of her children as well as his wife that he was not a suitable candidate for chronic hemodialysis and conservative management if progressing onto ESRD would be highly recommended (based on his advanced age, performance scale, albumin and comorbidity index). 2.  Hyponatremia: Secondary to acute kidney injury and cirrhosis with free water excretion defect.  Monitor with diuresis. 3.  Hyperkalemia: Mild, monitor with ongoing diuresis and restrict oral potassium intake. 4.  Anemia: Secondary to recent GI bleed and chronic illness.    Check iron studies today. 5.  History of cryptogenic cirrhosis 6.  Malnutrition/debilitation  Subjective:   Denies any complaints, intermittently coughing with yellowish phlegm.   Objective:   BP 113/65 (BP Location: Right Arm)   Pulse 70   Temp (!) 97.4 F (36.3 C)   Resp 16   Ht 5\' 8"  (1.727 m)   Wt 90.8 kg   SpO2 98%   BMI 30.44 kg/m   Intake/Output Summary (Last 24 hours) at 05/23/2018 1048 Last data filed at 05/23/2018 1000 Gross per 24 hour  Intake 860 ml  Output 4502 ml  Net -3642 ml   Weight change: -6.69 kg  Physical Exam: Gen: Comfortably resting in bed, family members at bedside-son/daughter/wife CVS: Pulse regular rhythm, normal rate, S1 and S2 normal Resp: Coarse breath sounds bilaterally-poor inspiratory effort Abd: Soft, obese, nontender Ext: 1-2+ lower extremity edema  Imaging: No results  found.  Labs: BMET Recent Labs  Lab 05/19/18 2056 05/20/18 0343 05/21/18 0818 05/22/18 0528 05/23/18 0431  NA 132* 133* 134* 134* 136  K 5.2* 5.0 5.2* 5.3* 4.7  CL 107 108 108 107 107  CO2 17* 16* 19* 20* 21*  GLUCOSE 79 53* 56* 68* 101*  BUN 42* 43* 41* 42* 42*  CREATININE 3.44* 3.46* 3.55* 3.55* 3.61*  CALCIUM 8.6* 8.6* 8.5* 9.0 9.3  PHOS  --   --   --  4.0 3.8   CBC Recent Labs  Lab 05/20/18 0343 05/21/18 0818 05/22/18 0528 05/23/18 0806  WBC 3.9* 2.4* 2.6* 3.1*  NEUTROABS 3.3  --   --   --   HGB 8.6* 7.6* 7.6* 7.8*  HCT 27.9* 24.1* 24.4* 24.9*  MCV 96.2 94.9 95.3 94.3  PLT 41* 30* 32* 29*   Medications:    . feeding supplement (ENSURE ENLIVE)  237 mL Oral BID BM  . insulin aspart  0-9 Units Subcutaneous TID WC  . iron polysaccharides  150 mg Oral Daily  . levothyroxine  50 mcg Oral Q0600  . midodrine  5 mg Oral TID WC  . multivitamin with minerals  1 tablet Oral q morning - 10a  . pantoprazole  40 mg Oral BID  . simvastatin  20 mg Oral q1800   Elmarie Shiley, MD 05/23/2018, 10:48 AM

## 2018-05-24 DIAGNOSIS — K7460 Unspecified cirrhosis of liver: Secondary | ICD-10-CM

## 2018-05-24 DIAGNOSIS — I509 Heart failure, unspecified: Secondary | ICD-10-CM

## 2018-05-24 DIAGNOSIS — Z515 Encounter for palliative care: Secondary | ICD-10-CM

## 2018-05-24 DIAGNOSIS — Z7189 Other specified counseling: Secondary | ICD-10-CM

## 2018-05-24 DIAGNOSIS — R601 Generalized edema: Secondary | ICD-10-CM

## 2018-05-24 LAB — GLUCOSE, CAPILLARY
GLUCOSE-CAPILLARY: 116 mg/dL — AB (ref 70–99)
Glucose-Capillary: 102 mg/dL — ABNORMAL HIGH (ref 70–99)
Glucose-Capillary: 64 mg/dL — ABNORMAL LOW (ref 70–99)
Glucose-Capillary: 64 mg/dL — ABNORMAL LOW (ref 70–99)
Glucose-Capillary: 64 mg/dL — ABNORMAL LOW (ref 70–99)
Glucose-Capillary: 64 mg/dL — ABNORMAL LOW (ref 70–99)
Glucose-Capillary: 66 mg/dL — ABNORMAL LOW (ref 70–99)
Glucose-Capillary: 66 mg/dL — ABNORMAL LOW (ref 70–99)
Glucose-Capillary: 78 mg/dL (ref 70–99)
Glucose-Capillary: 79 mg/dL (ref 70–99)

## 2018-05-24 LAB — RENAL FUNCTION PANEL
Albumin: 3.5 g/dL (ref 3.5–5.0)
Anion gap: 9 (ref 5–15)
BUN: 44 mg/dL — ABNORMAL HIGH (ref 8–23)
CO2: 24 mmol/L (ref 22–32)
Calcium: 9.3 mg/dL (ref 8.9–10.3)
Chloride: 105 mmol/L (ref 98–111)
Creatinine, Ser: 3.67 mg/dL — ABNORMAL HIGH (ref 0.61–1.24)
GFR calc Af Amer: 17 mL/min — ABNORMAL LOW (ref >60)
GFR calc non Af Amer: 14 mL/min — ABNORMAL LOW (ref >60)
GLUCOSE: 68 mg/dL — AB (ref 70–99)
Phosphorus: 3.8 mg/dL (ref 2.5–4.6)
Potassium: 4.7 mmol/L (ref 3.5–5.1)
Sodium: 138 mmol/L (ref 135–145)

## 2018-05-24 LAB — CBC
HCT: 25.5 % — ABNORMAL LOW (ref 39.0–52.0)
Hemoglobin: 7.8 g/dL — ABNORMAL LOW (ref 13.0–17.0)
MCH: 28.7 pg (ref 26.0–34.0)
MCHC: 30.6 g/dL (ref 30.0–36.0)
MCV: 93.8 fL (ref 80.0–100.0)
Platelets: 27 10*3/uL — CL (ref 150–400)
RBC: 2.72 MIL/uL — ABNORMAL LOW (ref 4.22–5.81)
RDW: 20.5 % — ABNORMAL HIGH (ref 11.5–15.5)
WBC: 2.4 10*3/uL — ABNORMAL LOW (ref 4.0–10.5)
nRBC: 0 % (ref 0.0–0.2)

## 2018-05-24 MED ORDER — DEXTROSE IN LACTATED RINGERS 5 % IV SOLN: INTRAVENOUS | Status: DC

## 2018-05-24 MED ORDER — DEXTROSE 50 % IV SOLN
INTRAVENOUS | Status: AC
  Administered 2018-05-24: 25 mL
  Filled 2018-05-24: qty 50

## 2018-05-24 MED ORDER — SODIUM CHLORIDE 0.9 % IV SOLN
510.0000 mg | Freq: Once | INTRAVENOUS | Status: AC
  Administered 2018-05-24: 510 mg via INTRAVENOUS
  Filled 2018-05-24: qty 17

## 2018-05-24 NOTE — Progress Notes (Signed)
Patient ID: Gary Ingram., male   DOB: [birth year], 85 y.o.   MRN: [id number]  [facility] Progress Note   Assessment/ Plan:   1.  Acute kidney injury on chronic kidney disease stage IV: Nonoliguric, likely hemodynamically mediated acute kidney injury without clear evidence of HRS.  Continues to have good urine output and yesterday evening transition to oral diuretics to try and tailor an outpatient regimen.  Reminded his son Marden Noble and wife again that he was not a suitable candidate for chronic hemodialysis and conservative management is recommended if he progresses on to ESRD.  I highly appreciate the palliative care service seeing him for his multiple comorbidities and recent downward course. 2.  Hyponatremia: Secondary to acute kidney injury and cirrhosis with free water excretion defect.  Monitor with diuresis. 3.  Anemia: Secondary to recent GI bleed and chronic illness.  With low iron saturation and ferritin, will give intravenous iron. 4.  History of cryptogenic cirrhosis 5.  Malnutrition/debilitation  Subjective:   He underwent modified swallow evaluation that show significant aspiration risk.   Objective:   BP (!) 99/47 (BP Location: Right Arm)   Pulse 68   Temp (!) 97.5 F (36.4 C)   Resp 18   Ht 5\' 8"  (1.727 m)   Wt 88.9 kg   SpO2 97%   BMI 29.80 kg/m   Intake/Output Summary (Last 24 hours) at 05/24/2018 0846 Last data filed at 05/24/2018 0700 Gross per 24 hour  Intake 546.28 ml  Output 3676 ml  Net -3129.72 ml   Weight change: -1.905 kg  Physical Exam: Gen: Comfortably resting in bed, speaking to palliative care team. CVS: Pulse regular rhythm, normal rate, S1 and S2 normal Resp: Coarse breath sounds bilaterally-poor inspiratory effort Abd: Soft, obese, nontender Ext: 1-2+ lower extremity edema  Imaging: Dg Swallowing Func-speech Pathology  Result Date: 05/23/2018 Objective Swallowing Evaluation: Type of Study: MBS-Modified Barium Swallow Study   Patient Details Name: Gary Ingram. MRN: [id number] Date of Birth: 1933/04/14 Today's Date: 05/23/2018 Time: SLP Start Time (ACUTE ONLY): 1215 -SLP Stop Time (ACUTE ONLY): 1230 SLP Time Calculation (min) (ACUTE ONLY): 15 min Past Medical History: Past Medical History: Diagnosis Date . Adenomatous colon polyp  . Anemia  . AVM (arteriovenous malformation) of colon  . Chronic kidney disease, stage III (moderate) (HCC)  . Diabetes ([place])  . Dyslipidemia  . Esophageal varices ([place])  . GI bleed  . Helicobacter pylori gastritis  . Hypertension  . Iron deficiency anemia  . Obesity  . Peripheral neuropathy  . Portal hypertension ([place])  . Splenomegaly  . Thrombocytopenia ([place])  . Thrombocytopenia ([place])  . TIA (transient ischemic attack)   ? Marland Kitchen Vertigo  . Vertigo  Past Surgical History: Past Surgical History: Procedure Laterality Date . COLONOSCOPY N/A 06/10/2013  Procedure: COLONOSCOPY;  Surgeon: Lafayette Dragon, MD;  Location: [facility] ENDOSCOPY;  Service: Endoscopy;  Laterality: N/A; . ESOPHAGEAL BANDING  04/30/2018  Procedure: ESOPHAGEAL BANDING;  Surgeon: Lavena Bullion, DO;  Location: [facility];  Service: Gastroenterology;; . ESOPHAGOGASTRODUODENOSCOPY N/A 06/10/2013  Procedure: ESOPHAGOGASTRODUODENOSCOPY (EGD);  Surgeon: Lafayette Dragon, MD;  Location: [facility];  Service: Endoscopy;  Laterality: N/A; . ESOPHAGOGASTRODUODENOSCOPY (EGD) WITH PROPOFOL N/A 04/30/2018  Procedure: ESOPHAGOGASTRODUODENOSCOPY (EGD) WITH PROPOFOL;  Surgeon: Lavena Bullion, DO;  Location: [facility];  Service: Gastroenterology;  Laterality: N/A; . TONSILLECTOMY AND ADENOIDECTOMY   HPI: Pt is an 85 y.o. male admitted 05/19/18 with progressive weakness and fall. Worked up for AKI on CKD IV with  anasarca; pt also with cryptogenic cirrhosis (recent admission with variceal bleed requiring banding). PMH includes R toe wound (recent debridement), CKD, DM2, HTN, DM.  Subjective: pleasant, fatigued Assessment / Plan / Recommendation CHL IP CLINICAL  IMPRESSIONS 05/23/2018 Clinical Impression Patient presents with a moderate oral and pharyngeal dysphagia characterized by decreased swallow initiation and delays to vallecular sinus; vallecular and pyriform sinus residuals with puree solids that slowly cleared with subsequent swallows and liquids; aspiration during swallow with thin liquids, with cough occuring after liquid had already passed through vocal cords and into trachea. SLP Visit Diagnosis Dysphagia, oropharyngeal phase (R13.12) Attention and concentration deficit following -- Frontal lobe and executive function deficit following -- Impact on safety and function Moderate aspiration risk;Severe aspiration risk   CHL IP TREATMENT RECOMMENDATION 05/23/2018 Treatment Recommendations Therapy as outlined in treatment plan below   Prognosis 05/23/2018 Prognosis for Safe Diet Advancement Fair Barriers to Reach Goals Severity of deficits Barriers/Prognosis Comment -- CHL IP DIET RECOMMENDATION 05/23/2018 SLP Diet Recommendations Dysphagia 1 (Puree) solids;Nectar thick liquid Liquid Administration via Spoon;Cup Medication Administration Crushed with puree Compensations Minimize environmental distractions;Slow rate;Small sips/bites Postural Changes Seated upright at 90 degrees   CHL IP OTHER RECOMMENDATIONS 05/23/2018 Recommended Consults -- Oral Care Recommendations Oral care BID Other Recommendations Order thickener from pharmacy;Prohibited food (jello, ice cream, thin soups);Remove water pitcher;Clarify dietary restrictions   CHL IP FOLLOW UP RECOMMENDATIONS 05/23/2018 Follow up Recommendations Skilled Nursing facility;24 hour supervision/assistance   CHL IP FREQUENCY AND DURATION 05/23/2018 Speech Therapy Frequency (ACUTE ONLY) min 2x/week Treatment Duration 1 week      CHL IP ORAL PHASE 05/23/2018 Oral Phase Impaired Oral - Pudding Teaspoon -- Oral - Pudding Cup -- Oral - Honey Teaspoon -- Oral - Honey Cup -- Oral - Nectar Teaspoon -- Oral - Nectar Cup -- Oral - Nectar  Straw -- Oral - Thin Teaspoon -- Oral - Thin Cup -- Oral - Thin Straw -- Oral - Puree Weak lingual manipulation;Delayed oral transit Oral - Mech Soft -- Oral - Regular -- Oral - Multi-Consistency -- Oral - Pill -- Oral Phase - Comment --  CHL IP PHARYNGEAL PHASE 05/23/2018 Pharyngeal Phase Impaired Pharyngeal- Pudding Teaspoon -- Pharyngeal -- Pharyngeal- Pudding Cup -- Pharyngeal -- Pharyngeal- Honey Teaspoon -- Pharyngeal -- Pharyngeal- Honey Cup -- Pharyngeal -- Pharyngeal- Nectar Teaspoon Delayed swallow initiation-vallecula;Reduced laryngeal elevation;Pharyngeal residue - valleculae;Pharyngeal residue - pyriform;Lateral channel residue Pharyngeal -- Pharyngeal- Nectar Cup Delayed swallow initiation-vallecula;Pharyngeal residue - valleculae;Pharyngeal residue - pyriform;Lateral channel residue Pharyngeal -- Pharyngeal- Nectar Straw -- Pharyngeal -- Pharyngeal- Thin Teaspoon -- Pharyngeal -- Pharyngeal- Thin Cup Delayed swallow initiation-vallecula;Delayed swallow initiation-pyriform sinuses;Penetration/Aspiration during swallow;Pharyngeal residue - valleculae;Pharyngeal residue - pyriform;Lateral channel residue Pharyngeal Material enters airway, passes BELOW cords and not ejected out despite cough attempt by patient Pharyngeal- Thin Straw -- Pharyngeal -- Pharyngeal- Puree Delayed swallow initiation-vallecula;Pharyngeal residue - valleculae;Pharyngeal residue - pyriform;Lateral channel residue Pharyngeal -- Pharyngeal- Mechanical Soft -- Pharyngeal -- Pharyngeal- Regular -- Pharyngeal -- Pharyngeal- Multi-consistency -- Pharyngeal -- Pharyngeal- Pill -- Pharyngeal -- Pharyngeal Comment --  CHL IP CERVICAL ESOPHAGEAL PHASE 05/23/2018 Cervical Esophageal Phase WFL Pudding Teaspoon -- Pudding Cup -- Honey Teaspoon -- Honey Cup -- Nectar Teaspoon -- Nectar Cup -- Nectar Straw -- Thin Teaspoon -- Thin Cup -- Thin Straw -- Puree -- Mechanical Soft -- Regular -- Multi-consistency -- Pill -- Cervical Esophageal Comment  -- Sonia Baller, MA, CCC-SLP 05/23/18 6:55 PM               Labs: BMET  Recent Labs  Lab 05/19/18 2056 05/20/18 0343 05/21/18 0818 05/22/18 0528 05/23/18 0431 05/24/18 0456  NA 132* 133* 134* 134* 136 138  K 5.2* 5.0 5.2* 5.3* 4.7 4.7  CL 107 108 108 107 107 105  CO2 17* 16* 19* 20* 21* 24  GLUCOSE 79 53* 56* 68* 101* 68*  BUN 42* 43* 41* 42* 42* 44*  CREATININE 3.44* 3.46* 3.55* 3.55* 3.61* 3.67*  CALCIUM 8.6* 8.6* 8.5* 9.0 9.3 9.3  PHOS  --   --   --  4.0 3.8 3.8   CBC Recent Labs  Lab 05/20/18 0343 05/21/18 0818 05/22/18 0528 05/23/18 0806 05/24/18 0456  WBC 3.9* 2.4* 2.6* 3.1* 2.4*  NEUTROABS 3.3  --   --   --   --   HGB 8.6* 7.6* 7.6* 7.8* 7.8*  HCT 27.9* 24.1* 24.4* 24.9* 25.5*  MCV 96.2 94.9 95.3 94.3 93.8  PLT 41* 30* 32* 29* 27*   Medications:    . feeding supplement (ENSURE ENLIVE)  237 mL Oral BID BM  . iron polysaccharides  150 mg Oral Daily  . levothyroxine  50 mcg Oral Q0600  . midodrine  5 mg Oral TID WC  . multivitamin with minerals  1 tablet Oral q morning - 10a  . pantoprazole  40 mg Oral BID  . simvastatin  20 mg Oral q1800  . torsemide  40 mg Oral BID   Elmarie Shiley, MD 05/24/2018, 8:46 AM

## 2018-05-24 NOTE — Progress Notes (Signed)
CBg 79, gave some OJ, thanks Buckner Malta.

## 2018-05-24 NOTE — Consult Note (Signed)
Consultation Note Date: 05/24/2018   Patient Name: Gary Ingram.  DOB: [birth year]  MRN: [id number]  Age / Sex: 85 y.o., male  PCP: Marton Redwood, MD Referring Physician: Domenic Polite, MD  Reason for Consultation: Establishing goals of care and Psychosocial/spiritual support  HPI/Patient Profile: 85 y.o. male  with past medical history of cryptogenic cirrhosis, diabetes type 2, hypertension, colonic AVM, chronic kidney disease stage III, dysphasia, pancytopenia admitted on 05/19/2018 with worsening weakness and shortness of breath.  Patient also has been having increased edema.  When seen in the emergency room he exhibited anasarca including scrotal,abdominal distention, and edema.  Chest x-ray showed new left pleural effusion.  Patient was admitted earlier in January with a GI bleed and banding of esophageal varices.  Since that time his kidney function appears to be worsening.  His creatinine as of 05/24/2018 is now 3.67.  He has received albumin transfusion and albumin level is now 3.5.  Albumin upon admission  was 2.4, white blood cell count 2.4, hemoglobin 7.8, platelets 27  Consult ordered for goals of care.   Clinical Assessment and Goals of Care: Patient seen, chart reviewed.  Met with patient, patient's wife, son Marden Noble, and daughter Lelon Frohlich.  Introduced palliative medicine services as an Chemical engineer and source of support.  Also discussed and compared,contrasted aggressive medical treatment options versus comfort/symptom based approach to care.  Mrs. Stewart shares with me that she and her husband do not have advanced directives nor have they ever discussed anything in terms of goals of care.  Introduced this topic as well as terms full code versus DNR.  Wife is very tearful.  She recognizes that he is getting sicker because of his liver disease but is still hopeful that he can get stronger to come  home.  Both Mrs. Kerin Ransom and Mr. Vinal son Marden Noble do not appear to recognize the severity of his underlying liver disease until this hospitalization  Primary decision maker would be patient's spouse, Ferdinand Revoir at [phone].  She is making healthcare decisions for herself and her husband in conjunction with the support of her children .  Their numbers are listed under demographics in epic  Patient and family are considering short-term rehab to see if he can get stronger to return home.  Later met with daughter Lelon Frohlich to discuss goals of care.  She seems to recognize the severity of her father's illness.  She states Dr. Brigitte Pulse prepared her for a prognosis of 6 months and I to verified  I also felt as  if things continue to go the way they are now, I would not be surprised if patient only had months to live.  Reviewed hard choices for loving People booklet as well as MOST form to use as a template to discuss goals of care with her brother and her mother.  Both her mother and father are under educated, with limited ability to read and write    SUMMARY OF RECOMMENDATIONS   Hard choices for loving people booklet given to further  goals of care discussion. MOST form reviewed Recommended that family discuss initially patient's CODE STATUS. Daughter recognizes patient's ability to "rehab" is very limited but probably would still like to pursue this at least initially as they work through next steps.  At this point we did open his discussion about hospice support in the home being in the home is his primary goal, quality of life Code Status/Advance Care Planning:  Full code  Palliative Prophylaxis:   Aspiration, Bowel Regimen, Delirium Protocol, Eye Care, Frequent Pain Assessment, Oral Care and Turn Reposition  Additional Recommendations (Limitations, Scope, Preferences):  Full Scope Treatment  Psycho-social/Spiritual:   Desire for further Chaplaincy support:no  Additional Recommendations:  Referral to Community Resources   Prognosis:   Unable to determine  Discharge Planning: To Be Determined      Primary Diagnoses: Present on Admission: . Esophageal varices ([place]) . Cirrhosis of liver without ascites ([place]) . Anasarca   I have reviewed the medical record, interviewed the patient and family, and examined the patient. The following aspects are pertinent.  Past Medical History:  Diagnosis Date  . Adenomatous colon polyp   . Anemia   . AVM (arteriovenous malformation) of colon   . Chronic kidney disease, stage III (moderate) (HCC)   . Diabetes ([place])   . Dyslipidemia   . Esophageal varices ([place])   . GI bleed   . Helicobacter pylori gastritis   . Hypertension   . Iron deficiency anemia   . Obesity   . Peripheral neuropathy   . Portal hypertension ([place])   . Splenomegaly   . Thrombocytopenia ([place])   . Thrombocytopenia ([place])   . TIA (transient ischemic attack)    ?  Marland Kitchen Vertigo   . Vertigo    Social History   Socioeconomic History  . Marital status: Married    Spouse name: Not on file  . Number of children: Not on file  . Years of education: Not on file  . Highest education level: Not on file  Occupational History  . Not on file  Social Needs  . Financial resource strain: Not on file  . Food insecurity:    Worry: Not on file    Inability: Not on file  . Transportation needs:    Medical: Not on file    Non-medical: Not on file  Tobacco Use  . Smoking status: Former Smoker    Last attempt to quit: 06/10/1952    Years since quitting: 65.9  . Smokeless tobacco: Never Used  . Tobacco comment: never used tobacco  Substance and Sexual Activity  . Alcohol use: No    Alcohol/week: 0.0 standard drinks  . Drug use: No  . Sexual activity: Not on file  Lifestyle  . Physical activity:    Days per week: Not on file    Minutes per session: Not on file  . Stress: Not on file  Relationships  . Social connections:    Talks on phone: Not on file    Gets  together: Not on file    Attends religious service: Not on file    Active member of club or organization: Not on file    Attends meetings of clubs or organizations: Not on file    Relationship status: Not on file  Other Topics Concern  . Not on file  Social History Narrative  . Not on file   Family History  Problem Relation Age of Onset  . Cancer Sister        x 2  .  Diabetes Son   . Colon cancer Neg Hx    Scheduled Meds: . feeding supplement (ENSURE ENLIVE)  237 mL Oral BID BM  . levothyroxine  50 mcg Oral Q0600  . midodrine  5 mg Oral TID WC  . multivitamin with minerals  1 tablet Oral q morning - 10a  . pantoprazole  40 mg Oral BID  . simvastatin  20 mg Oral q1800  . torsemide  40 mg Oral BID   Continuous Infusions: . ferumoxytol     PRN Meds:.acetaminophen **OR** acetaminophen, food thickener, ondansetron **OR** ondansetron (ZOFRAN) IV Medications Prior to Admission:  Prior to Admission medications   Medication Sig Start Date End Date Taking? Authorizing Provider  doxycycline (VIBRA-TABS) 100 MG tablet Take 100 mg by mouth 2 (two) times daily.  05/08/18  Yes [provider]  fenofibrate 160 MG tablet Take 160 mg by mouth at bedtime.    Yes [provider]  furosemide (LASIX) 40 MG tablet Take 40 mg by mouth daily with supper.  05/14/18  Yes [provider]  metoprolol (LOPRESSOR) 50 MG tablet Take 50 mg by mouth 2 (two) times daily.  07/13/11  Yes [provider]  Multiple Vitamins-Minerals (CENTRUM ADULTS) TABS Take 1 tablet by mouth every morning.    Yes [provider]  Multiple Vitamins-Minerals (PRESERVISION AREDS 2 PO) Take 1 tablet by mouth 2 (two) times daily.    Yes [provider]  mupirocin ointment (BACTROBAN) 2 % Apply 1 application topically 2 (two) times daily as needed (for toe).  05/08/18  Yes [provider]  ondansetron (ZOFRAN-ODT) 8 MG disintegrating tablet Take 8 mg by mouth every 8 (eight)  hours as needed for nausea or vomiting.  04/20/18  Yes [provider]  pantoprazole (PROTONIX) 40 MG tablet Take 1 tablet (40 mg total) by mouth 2 (two) times daily. 05/03/18  Yes Thurnell Lose, MD  POLY-IRON 150 150 MG capsule Take 150 mg by mouth daily.  08/15/14  Yes [provider]  simvastatin (ZOCOR) 20 MG tablet Take 20 mg by mouth daily at 6 PM.  08/15/14  Yes [provider]  spironolactone (ALDACTONE) 25 MG tablet Take 25 mg by mouth 2 (two) times daily.   Yes [provider]  triamcinolone cream (KENALOG) 0.1 % Apply 1 application topically 2 (two) times daily as needed (rash).  04/20/18  Yes [provider]  fluconazole (DIFLUCAN) 100 MG tablet Take 1 tablet (100 mg total) by mouth daily. Patient not taking: Reported on 05/20/2018 05/04/18   Thurnell Lose, MD  traMADol (ULTRAM) 50 MG tablet Take 1 tablet (50 mg total) by mouth every 6 (six) hours as needed. Patient not taking: Reported on 04/29/2018 09/09/14   Volanda Napoleon, MD   No Known Allergies Review of Systems  Unable to perform ROS: Mental status change    Physical Exam Vitals signs and nursing note reviewed.  Constitutional:      Comments: Patient is very somnolent Will awaken to voice and able to answer only brief simple yes/no questions  HENT:     Head: Normocephalic and atraumatic.  Cardiovascular:     Rate and Rhythm: Normal rate.  Pulmonary:     Effort: Pulmonary effort is normal.  Abdominal:     General: There is distension.     Tenderness: There is abdominal tenderness.  Musculoskeletal:     Comments: Patient is very weak  Skin:    General: Skin is warm and dry.  Coloration: Skin is pale.  Neurological:     Comments: Oriented to person, recognizes his family, knows he is in the hospital Limited understanding of his illness and what brought him to the hospital  Psychiatric:     Comments: No acute agitation otherwise unable to test mental status exam      Vital Signs: BP (!) 99/47 (BP Location: Right Arm)   Pulse 68   Temp (!) 97.5 F (36.4 C)   Resp 18   Ht 5' 8" (1.727 m)   Wt 88.9 kg   SpO2 97%   BMI 29.80 kg/m  Pain Scale: 0-10   Pain Score: 0-No pain   SpO2: SpO2: 97 % O2 Device:SpO2: 97 % O2 Flow Rate: .O2 Flow Rate (L/min): 2 L/min  IO: Intake/output summary:   Intake/Output Summary (Last 24 hours) at 05/24/2018 1018 Last data filed at 05/24/2018 0700 Gross per 24 hour  Intake 546.28 ml  Output 2775 ml  Net -2228.72 ml    LBM: Last BM Date: 05/23/18 Baseline Weight: Weight: 94.4 kg Most recent weight: Weight: 88.9 kg     Palliative Assessment/Data:   Flowsheet Rows     Most Recent Value  Intake Tab  Referral Department  Hospitalist  Unit at Time of Referral  Med/Surg Unit  Palliative Care Primary Diagnosis  Pulmonary  Date Notified  05/23/18  Reason for referral  Clarify Goals of Care, Psychosocial or Spiritual support  Date of Admission  05/20/18  Date first seen by Palliative Care  05/24/18  # of days Palliative referral response time  1 Day(s)  # of days IP prior to Palliative referral  3  Clinical Assessment  Palliative Performance Scale Score  30%  Pain Max last 24 hours  Not able to report  Pain Min Last 24 hours  Not able to report  Dyspnea Max Last 24 Hours  Not able to report  Dyspnea Min Last 24 hours  Not able to report  Nausea Max Last 24 Hours  Not able to report  Nausea Min Last 24 Hours  Not able to report  Anxiety Max Last 24 Hours  Not able to report  Anxiety Min Last 24 Hours  Not able to report  Other Max Last 24 Hours  Not able to report  Psychosocial & Spiritual Assessment  Palliative Care Outcomes  Patient/Family meeting held?  Yes  Who was at the meeting?  wife, son, dtr  Palliative Care Outcomes  Provided psychosocial or spiritual support  Palliative Care follow-up planned  Yes, Facility      Time In: 1030 Time Out: 1110 Time Total: 70 min Greater than 50%  of  this time was spent counseling and coordinating care related to the above assessment and plan.  Signed by: Dory Horn, NP   Please contact Palliative Medicine Team phone at [phone] for questions and concerns.  For individual provider: See Shea Evans

## 2018-05-24 NOTE — Progress Notes (Signed)
CBG 64, gave OJ went to 64 again, gave another dose of OJ, will continue to monitor, Thanks, Buckner Malta.

## 2018-05-24 NOTE — Progress Notes (Signed)
Patient resting comfortably during shift report. Denies complaints.  

## 2018-05-24 NOTE — Progress Notes (Signed)
CBG 66, gave dose of D50 IV, went to 102, after this, will continue to monitor, thanks Buckner Malta.

## 2018-05-24 NOTE — Progress Notes (Signed)
PROGRESS NOTE    [name].  ELF:[id number] DOB: Aug 22, 1932 DOA: 05/19/2018 PCP: Marton Redwood, MD  Brief Narrative: This is an 85 year old chronically ill male with history of cryptogenic cirrhosis, chronic kidney disease stage IV, type 2 diabetes, pancytopenia, hypertension, diabetes mellitus was hospitalized 3 weeks ago with upper GI bleed secondary to variceal bleed, treated with banding and transfused 3 units of PRBC, subsequently discharged home in a stable condition, brought to the ED by family with weakness, Fall, no loss of consciousness. -Upon evaluation in the emergency room he was noted to have extensive edema extending to his scrotum including abdominal wall swelling, also found to have worsening creatinine to 3.4 -Nephrology consulting, started on Lasix and albumin  Assessment & Plan:   Acute on chronic kidney disease stage IV with anasarca third spacing, in the background of cryptogenic cirrhosis -Appreciate nephrology input, suspected to be hemodynamically mediated, hepatorenal syndrome appears less likely -Renal ultrasound without hydronephrosis -Diuresed well on regimen of Lasix and albumin, diuresing well on regimen of Lasix and albumin, -6.6 L in 3 days  -Transition to oral torsemide yesterday, appreciate nephrology input  -Unfortunately with advanced age, numerous comorbidities including stage IV kidney disease, cirrhosis, severe malnutrition and failure to thrive patient's long-term prognosis is poor, he is not considered to be a dialysis candidate, this was discussed multiple times with family by nephrology and myself  -Palliative medicine consulted for goals of care  -Strongly recommended DNR to family multiple times  Cryptogenic cirrhosis with portal hypertension, recent variceal bleed status post recent banding -As above, continue Protonix -Hemoglobin had trended down a little bit, no active bleeding, anemia panel suggestive of chronic disease, and recent GI  bleed contributing -Suspect that his volume overloaded state is causing some degree of hemodilution, monitor with diuresis -Ammonia level was within normal limits  Sinus bradycardia -Beta-blocker on hold  Right foot great toe wound -Recent debridement, completed recent course of doxycycline, continue doxycycline yesterday  Diabetes mellitus type 2 -No longer on medications, CBGs running slightly lower with poor oral intake and cirrhosis causing depletion of glycogen stores  Pancytopenia/thrombocytopenia -Platelet count stable in the 30,000 range slightly worse from recent hospitalization, this is secondary to cirrhosis/splenic sequestration -Monitor  Hypothyroidism -Started Synthroid  DVT prophylaxis: SCDs Code Status: Full code, strongly recommended consideration of DNR Family Communication: Son and wife Disposition Plan: Plan for SNF in few days if stable would benefit from palliative care follow-up  Consultants:   Nephrology  Palliative medicine   Procedures:   Antimicrobials:    Subjective: -Continues to be weak overall, finally was able to sleep some last night, breathing improving  Objective: Vitals:   05/23/18 1552 05/23/18 2022 05/24/18 0541 05/24/18 1101  BP: (!) 118/55 128/79 (!) 99/47 118/61  Pulse:  85 68 75  Resp:  18 18 18   Temp:  (!) 97.4 F (36.3 C) (!) 97.5 F (36.4 C)   TempSrc:  Oral    SpO2:  99% 97% 99%  Weight:   88.9 kg   Height:        Intake/Output Summary (Last 24 hours) at 05/24/2018 1118 Last data filed at 05/24/2018 0700 Gross per 24 hour  Intake 546.28 ml  Output 2775 ml  Net -2228.72 ml   Filed Weights   05/22/18 0407 05/23/18 0637 05/24/18 0541  Weight: 97.5 kg 90.8 kg 88.9 kg    Examination:  Gen: Chronically ill-appearing elderly male, laying in bed, no distress, alert awake oriented x2 HEENT: PERRLA, Neck supple, no  JVD Lungs: Decreased breath sounds at both bases CVS: RRR,No Gallops,Rubs or new Murmurs Abd:  Soft, mildly distended, nontender, bowel sounds present Extremities: 1+ edema, scrotal edema Skin: no new rashes Psychiatry: Flat affect    Data Reviewed:   CBC: Recent Labs  Lab 05/20/18 0343 05/21/18 0818 05/22/18 0528 05/23/18 0806 05/24/18 0456  WBC 3.9* 2.4* 2.6* 3.1* 2.4*  NEUTROABS 3.3  --   --   --   --   HGB 8.6* 7.6* 7.6* 7.8* 7.8*  HCT 27.9* 24.1* 24.4* 24.9* 25.5*  MCV 96.2 94.9 95.3 94.3 93.8  PLT 41* 30* 32* 29* 27*   Basic Metabolic Panel: Recent Labs  Lab 05/20/18 0343 05/21/18 0818 05/22/18 0528 05/23/18 0431 05/24/18 0456  NA 133* 134* 134* 136 138  K 5.0 5.2* 5.3* 4.7 4.7  CL 108 108 107 107 105  CO2 16* 19* 20* 21* 24  GLUCOSE 53* 56* 68* 101* 68*  BUN 43* 41* 42* 42* 44*  CREATININE 3.46* 3.55* 3.55* 3.61* 3.67*  CALCIUM 8.6* 8.5* 9.0 9.3 9.3  MG 1.5*  --   --   --   --   PHOS  --   --  4.0 3.8 3.8   GFR: Estimated Creatinine Clearance: 16.5 mL/min (A) (by C-G formula based on SCr of 3.67 mg/dL (H)). Liver Function Tests: Recent Labs  Lab 05/19/18 2056 05/20/18 0343 05/21/18 0818 05/22/18 0528 05/23/18 0431 05/24/18 0456  AST 64* 63* 53*  --   --   --   ALT 26 24 23   --   --   --   ALKPHOS 52 44 40  --   --   --   BILITOT 0.6 1.1 0.8  --   --   --   PROT 5.2* 4.8* 5.1*  --   --   --   ALBUMIN 2.5* 2.4* 3.0* 3.3* 3.5 3.5   No results for input(s): LIPASE, AMYLASE in the last 168 hours. Recent Labs  Lab 05/21/18 1010  AMMONIA 38*   Coagulation Profile: Recent Labs  Lab 05/19/18 2056  INR 1.48   Cardiac Enzymes: Recent Labs  Lab 05/20/18 0343  TROPONINI <0.03   BNP (last 3 results) No results for input(s): PROBNP in the last 8760 hours. HbA1C: No results for input(s): HGBA1C in the last 72 hours. CBG: Recent Labs  Lab 05/24/18 0302 05/24/18 0342 05/24/18 0629 05/24/18 0711 05/24/18 0955  GLUCAP 66* 102* 64* 66* 116*   Lipid Profile: No results for input(s): CHOL, HDL, LDLCALC, TRIG, CHOLHDL, LDLDIRECT in  the last 72 hours. Thyroid Function Tests: No results for input(s): TSH, T4TOTAL, FREET4, T3FREE, THYROIDAB in the last 72 hours. Anemia Panel: Recent Labs    05/22/18 0859  FERRITIN 58  TIBC 267  IRON 53   Urine analysis:    Component Value Date/Time   COLORURINE STRAW (A) 05/20/2018 0506   APPEARANCEUR CLEAR 05/20/2018 0506   LABSPEC 1.004 (L) 05/20/2018 0506   PHURINE 5.0 05/20/2018 0506   GLUCOSEU NEGATIVE 05/20/2018 0506   HGBUR NEGATIVE 05/20/2018 0506   BILIRUBINUR NEGATIVE 05/20/2018 0506   KETONESUR NEGATIVE 05/20/2018 0506   PROTEINUR NEGATIVE 05/20/2018 0506   NITRITE NEGATIVE 05/20/2018 0506   LEUKOCYTESUR NEGATIVE 05/20/2018 0506   Sepsis Labs: @LABRCNTIP (procalcitonin:4,lacticidven:4)  )No results found for this or any previous visit (from the past 240 hour(s)).       Radiology Studies: Dg Swallowing Func-speech Pathology  Result Date: 05/23/2018 Objective Swallowing Evaluation: Type of Study: MBS-Modified Barium Swallow Study  Patient Details Name: Jakye Mullens. MRN: [id number] Date of Birth: [birth year] Today's Date: 05/23/2018 Time: SLP Start Time (ACUTE ONLY): 1215 -SLP Stop Time (ACUTE ONLY): 1230 SLP Time Calculation (min) (ACUTE ONLY): 15 min Past Medical History: Past Medical History: Diagnosis Date . Adenomatous colon polyp  . Anemia  . AVM (arteriovenous malformation) of colon  . Chronic kidney disease, stage III (moderate) (HCC)  . Diabetes ([place])  . Dyslipidemia  . Esophageal varices ([place])  . GI bleed  . Helicobacter pylori gastritis  . Hypertension  . Iron deficiency anemia  . Obesity  . Peripheral neuropathy  . Portal hypertension ([place])  . Splenomegaly  . Thrombocytopenia ([place])  . Thrombocytopenia ([place])  . TIA (transient ischemic attack)   ? Marland Kitchen Vertigo  . Vertigo  Past Surgical History: Past Surgical History: Procedure Laterality Date . COLONOSCOPY N/A 06/10/2013  Procedure: COLONOSCOPY;  Surgeon: Lafayette Dragon, MD;  Location: [facility] END[facility]COPY;  Service:  Endoscopy;  Laterality: N/A; . ESOPHAGEAL BANDING  04/30/2018  Procedure: ESOPHAGEAL BANDING;  Surgeon: Lavena Bullion, DO;  Location: [facility];  Service: Gastroenterology;; . ESOPHAGOGASTRODUODENOSCOPY N/A 06/10/2013  Procedure: ESOPHAGOGASTRODUODENOSCOPY (EGD);  Surgeon: Lafayette Dragon, MD;  Location: [facility];  Service: Endoscopy;  Laterality: N/A; . ESOPHAGOGASTRODUODENOSCOPY (EGD) WITH PROPOFOL N/A 04/30/2018  Procedure: ESOPHAGOGASTRODUODENOSCOPY (EGD) WITH PROPOFOL;  Surgeon: Lavena Bullion, DO;  Location: [facility];  Service: Gastroenterology;  Laterality: N/A; . TONSILLECTOMY AND ADENOIDECTOMY   HPI: Pt is an 85 y.o. male admitted 05/19/18 with progressive weakness and fall. Worked up for AKI on CKD IV with anasarca; pt also with cryptogenic cirrhosis (recent admission with variceal bleed requiring banding). PMH includes R toe wound (recent debridement), CKD, DM2, HTN, DM.  Subjective: pleasant, fatigued Assessment / Plan / Recommendation CHL IP CLINICAL IMPRESSIONS 05/23/2018 Clinical Impression Patient presents with a moderate oral and pharyngeal dysphagia characterized by decreased swallow initiation and delays to vallecular sinus; vallecular and pyriform sinus residuals with puree solids that slowly cleared with subsequent swallows and liquids; aspiration during swallow with thin liquids, with cough occuring after liquid had already passed through vocal cords and into trachea. SLP Visit Diagnosis Dysphagia, oropharyngeal phase (R13.12) Attention and concentration deficit following -- Frontal lobe and executive function deficit following -- Impact on safety and function Moderate aspiration risk;Severe aspiration risk   CHL IP TREATMENT RECOMMENDATION 05/23/2018 Treatment Recommendations Therapy as outlined in treatment plan below   Prognosis 05/23/2018 Prognosis for Safe Diet Advancement Fair Barriers to Reach Goals Severity of deficits Barriers/Prognosis Comment -- CHL IP DIET RECOMMENDATION  05/23/2018 SLP Diet Recommendations Dysphagia 1 (Puree) solids;Nectar thick liquid Liquid Administration via Spoon;Cup Medication Administration Crushed with puree Compensations Minimize environmental distractions;Slow rate;Small sips/bites Postural Changes Seated upright at 90 degrees   CHL IP OTHER RECOMMENDATIONS 05/23/2018 Recommended Consults -- Oral Care Recommendations Oral care BID Other Recommendations Order thickener from pharmacy;Prohibited food (jello, ice cream, thin soups);Remove water pitcher;Clarify dietary restrictions   CHL IP FOLLOW UP RECOMMENDATIONS 05/23/2018 Follow up Recommendations Skilled Nursing facility;24 hour supervision/assistance   CHL IP FREQUENCY AND DURATION 05/23/2018 Speech Therapy Frequency (ACUTE ONLY) min 2x/week Treatment Duration 1 week      CHL IP ORAL PHASE 05/23/2018 Oral Phase Impaired Oral - Pudding Teaspoon -- Oral - Pudding Cup -- Oral - Honey Teaspoon -- Oral - Honey Cup -- Oral - Nectar Teaspoon -- Oral - Nectar Cup -- Oral - Nectar Straw -- Oral - Thin Teaspoon -- Oral - Thin Cup -- Oral - Thin Straw -- Oral -  Puree Weak lingual manipulation;Delayed oral transit Oral - Mech Soft -- Oral - Regular -- Oral - Multi-Consistency -- Oral - Pill -- Oral Phase - Comment --  CHL IP PHARYNGEAL PHASE 05/23/2018 Pharyngeal Phase Impaired Pharyngeal- Pudding Teaspoon -- Pharyngeal -- Pharyngeal- Pudding Cup -- Pharyngeal -- Pharyngeal- Honey Teaspoon -- Pharyngeal -- Pharyngeal- Honey Cup -- Pharyngeal -- Pharyngeal- Nectar Teaspoon Delayed swallow initiation-vallecula;Reduced laryngeal elevation;Pharyngeal residue - valleculae;Pharyngeal residue - pyriform;Lateral channel residue Pharyngeal -- Pharyngeal- Nectar Cup Delayed swallow initiation-vallecula;Pharyngeal residue - valleculae;Pharyngeal residue - pyriform;Lateral channel residue Pharyngeal -- Pharyngeal- Nectar Straw -- Pharyngeal -- Pharyngeal- Thin Teaspoon -- Pharyngeal -- Pharyngeal- Thin Cup Delayed swallow  initiation-vallecula;Delayed swallow initiation-pyriform sinuses;Penetration/Aspiration during swallow;Pharyngeal residue - valleculae;Pharyngeal residue - pyriform;Lateral channel residue Pharyngeal Material enters airway, passes BELOW cords and not ejected out despite cough attempt by patient Pharyngeal- Thin Straw -- Pharyngeal -- Pharyngeal- Puree Delayed swallow initiation-vallecula;Pharyngeal residue - valleculae;Pharyngeal residue - pyriform;Lateral channel residue Pharyngeal -- Pharyngeal- Mechanical Soft -- Pharyngeal -- Pharyngeal- Regular -- Pharyngeal -- Pharyngeal- Multi-consistency -- Pharyngeal -- Pharyngeal- Pill -- Pharyngeal -- Pharyngeal Comment --  CHL IP CERVICAL ESOPHAGEAL PHASE 05/23/2018 Cervical Esophageal Phase WFL Pudding Teaspoon -- Pudding Cup -- Honey Teaspoon -- Honey Cup -- Nectar Teaspoon -- Nectar Cup -- Nectar Straw -- Thin Teaspoon -- Thin Cup -- Thin Straw -- Puree -- Mechanical Soft -- Regular -- Multi-consistency -- Pill -- Cervical Esophageal Comment -- Sonia Baller, MA, CCC-SLP 05/23/18 6:55 PM                   Scheduled Meds: . feeding supplement (ENSURE ENLIVE)  237 mL Oral BID BM  . levothyroxine  50 mcg Oral Q0600  . midodrine  5 mg Oral TID WC  . multivitamin with minerals  1 tablet Oral q morning - 10a  . pantoprazole  40 mg Oral BID  . simvastatin  20 mg Oral q1800  . torsemide  40 mg Oral BID   Continuous Infusions: . ferumoxytol       LOS: 4 days    Time spent: 23min    Domenic Polite, MD [facility]  05/24/2018, 11:18 AM

## 2018-05-24 NOTE — Progress Notes (Signed)
Patient presented with hypoglycemia this morning, though asymptomatic. PM RN gave 1/2 amp of D50, which did not increase the patient's glucose upon recheck. Call placed to night coverage, by PM RM resulted in D5/LR order placed.    Pt with anasarca, receiving diuretics and albumin; IVF NOT initiated due to apparent contraindication. Pt alert, taking PO within diet guidelines.   Upon attempt to page Dr Broadus John to question D5LR order, she simultaneously canceled said order. Dr Broadus John also confirmed that patient was ok to continue with PO intake in the low-mid 60s glucose readings he has given his asymptomatic presentation and fluid control issues.   Pt taking breakfast without difficulty at this time, with family assistance.     Update: 8550 pt with glucose of 116

## 2018-05-25 LAB — GLUCOSE, CAPILLARY
GLUCOSE-CAPILLARY: 79 mg/dL (ref 70–99)
Glucose-Capillary: 59 mg/dL — ABNORMAL LOW (ref 70–99)
Glucose-Capillary: 66 mg/dL — ABNORMAL LOW (ref 70–99)
Glucose-Capillary: 66 mg/dL — ABNORMAL LOW (ref 70–99)
Glucose-Capillary: 71 mg/dL (ref 70–99)
Glucose-Capillary: 60 mg/dL — ABNORMAL LOW (ref 70–99)
Glucose-Capillary: 110 mg/dL — ABNORMAL HIGH (ref 70–99)
Glucose-Capillary: 55 mg/dL — ABNORMAL LOW (ref 70–99)
Glucose-Capillary: 74 mg/dL (ref 70–99)
Glucose-Capillary: 62 mg/dL — ABNORMAL LOW (ref 70–99)
Glucose-Capillary: 78 mg/dL (ref 70–99)

## 2018-05-25 LAB — COMPREHENSIVE METABOLIC PANEL
ALT: 22 U/L (ref 0–44)
AST: 52 U/L — ABNORMAL HIGH (ref 15–41)
Albumin: 3.2 g/dL — ABNORMAL LOW (ref 3.5–5.0)
Alkaline Phosphatase: 46 U/L (ref 38–126)
Anion gap: 10 (ref 5–15)
BUN: 49 mg/dL — ABNORMAL HIGH (ref 8–23)
CO2: 26 mmol/L (ref 22–32)
Calcium: 9.3 mg/dL (ref 8.9–10.3)
Chloride: 103 mmol/L (ref 98–111)
Creatinine, Ser: 3.84 mg/dL — ABNORMAL HIGH (ref 0.61–1.24)
GFR calc Af Amer: 16 mL/min — ABNORMAL LOW (ref >60)
GFR calc non Af Amer: 14 mL/min — ABNORMAL LOW (ref >60)
Glucose, Bld: 85 mg/dL (ref 70–99)
Potassium: 4.5 mmol/L (ref 3.5–5.1)
SODIUM: 139 mmol/L (ref 135–145)
Total Bilirubin: 1.4 mg/dL — ABNORMAL HIGH (ref 0.3–1.2)
Total Protein: 5.6 g/dL — ABNORMAL LOW (ref 6.5–8.1)

## 2018-05-25 LAB — CBC
HCT: 24.3 % — ABNORMAL LOW (ref 39.0–52.0)
HEMOGLOBIN: 7.9 g/dL — AB (ref 13.0–17.0)
MCH: 30.4 pg (ref 26.0–34.0)
MCHC: 32.5 g/dL (ref 30.0–36.0)
MCV: 93.5 fL (ref 80.0–100.0)
Platelets: 35 10*3/uL — ABNORMAL LOW (ref 150–400)
RBC: 2.6 MIL/uL — ABNORMAL LOW (ref 4.22–5.81)
RDW: 20.7 % — ABNORMAL HIGH (ref 11.5–15.5)
WBC: 3.4 10*3/uL — ABNORMAL LOW (ref 4.0–10.5)
nRBC: 0 % (ref 0.0–0.2)

## 2018-05-25 LAB — RENAL FUNCTION PANEL
ANION GAP: 11 (ref 5–15)
Albumin: 3.1 g/dL — ABNORMAL LOW (ref 3.5–5.0)
BUN: 49 mg/dL — ABNORMAL HIGH (ref 8–23)
CO2: 25 mmol/L (ref 22–32)
Calcium: 9.2 mg/dL (ref 8.9–10.3)
Chloride: 102 mmol/L (ref 98–111)
Creatinine, Ser: 3.75 mg/dL — ABNORMAL HIGH (ref 0.61–1.24)
GFR calc Af Amer: 16 mL/min — ABNORMAL LOW (ref >60)
GFR calc non Af Amer: 14 mL/min — ABNORMAL LOW (ref >60)
Glucose, Bld: 84 mg/dL (ref 70–99)
POTASSIUM: 4.5 mmol/L (ref 3.5–5.1)
Phosphorus: 3.5 mg/dL (ref 2.5–4.6)
Sodium: 138 mmol/L (ref 135–145)

## 2018-05-25 MED ORDER — DEXTROSE 50 % IV SOLN
INTRAVENOUS | Status: AC
  Administered 2018-05-25: 25 mL
  Filled 2018-05-25: qty 50

## 2018-05-25 MED ORDER — GLUCOSE 40 % PO GEL
1.0000 | ORAL | Status: AC
  Administered 2018-05-25: 37.5 g via ORAL
  Filled 2018-05-25: qty 1

## 2018-05-25 MED ORDER — TORSEMIDE 20 MG PO TABS
40.0000 mg | ORAL_TABLET | Freq: Every day | ORAL | Status: DC
  Administered 2018-05-26 – 2018-05-28 (×3): 40 mg via ORAL
  Filled 2018-05-25 (×3): qty 2

## 2018-05-25 NOTE — Progress Notes (Signed)
  Speech Language Pathology Treatment: Dysphagia  Patient Details Name: Gary Ingram. MRN: [id number] DOB: 03-02-1933 Today's Date: 05/25/2018 Time: 1201-1213 SLP Time Calculation (min) (ACUTE ONLY): 12 min  Assessment / Plan / Recommendation Clinical Impression  SLP reviewed results and recommendations from MBS over the weekend, to which his wife shared, that he "choked all the time" PTA. Pt's granddaughter was eager to assist with feeding, and provided bites of puree and cup sips of nectar thick liquids with Min cues from SLP for adherence to safe swallowing precautions. Pt had one immediate cough following a sip of thickened liquids, which he briefly orally held and grimaced because he did not like the taste. Other trials were consumed without overt signs of aspiration. Education was reinforced about using coughing as a sign of difficulty swallowing/aspiration and to monitor for this closely during meal times, pausing the meal PRN. Will continue to follow acutely, but recommend SNF upon d/c for safety and to maximize potential function.   HPI HPI: Pt is an 85 y.o. male admitted 05/19/18 with progressive weakness and fall. Worked up for AKI on CKD IV with anasarca; pt also with cryptogenic cirrhosis (recent admission with variceal bleed requiring banding). PMH includes R toe wound (recent debridement), CKD, DM2, HTN, DM.      SLP Plan  Continue with current plan of care       Recommendations  Diet recommendations: Dysphagia 1 (puree);Nectar-thick liquid Liquids provided via: Cup;No straw;Teaspoon Medication Administration: Crushed with puree Supervision: Staff to assist with self feeding;Full supervision/cueing for compensatory strategies Compensations: Minimize environmental distractions;Slow rate;Small sips/bites Postural Changes and/or Swallow Maneuvers: Seated upright 90 degrees;Upright 30-60 min after meal                Oral Care Recommendations: Oral care BID Follow up  Recommendations: Skilled Nursing facility;24 hour supervision/assistance SLP Visit Diagnosis: Dysphagia, oropharyngeal phase (R13.12) Plan: Continue with current plan of care       GO                Venita Sheffield Gautam Langhorst 05/25/2018, 12:28 PM  Nuala Alpha, M.A. [place] [facility] Environmental education officer [phone] Office [phone]

## 2018-05-25 NOTE — Progress Notes (Signed)
CBG 64, gave strawberry Ensure, afterward went to 78, will continue to monitor, Thanks Buckner Malta.

## 2018-05-25 NOTE — Clinical Social Work Placement (Signed)
   CLINICAL SOCIAL WORK PLACEMENT  NOTE  Date:  05/25/2018  Patient Details  Name: Gary Ingram. MRN: [id number] Date of Birth: [birth year]  Clinical Social Work is seeking post-discharge placement for this patient at the [place] level of care (*CSW will initial, date and re-position this form in  chart as items are completed):      Patient/family provided with [place] Work Department's list of facilities offering this level of care within the geographic area requested by the patient (or if unable, by the patient's family).      Patient/family informed of their freedom to choose among providers that offer the needed level of care, that participate in Medicare, Medicaid or managed care program needed by the patient, have an available bed and are willing to accept the patient.      Patient/family informed of 's ownership interest in [facility] and [facility], as well as of the fact that they are under no obligation to receive care at these facilities.  PASRR submitted to EDS on 05/25/18     PASRR number received on 05/25/18     Existing PASRR number confirmed on       FL2 transmitted to all facilities in geographic area requested by pt/family on 05/25/18     FL2 transmitted to all facilities within larger geographic area on       Patient informed that his/her managed care company has contracts with or will negotiate with certain facilities, including the following:            Patient/family informed of bed offers received.  Patient chooses bed at       Physician recommends and patient chooses bed at      Patient to be transferred to   on  .  Patient to be transferred to facility by       Patient family notified on   of transfer.  Name of family member notified:        PHYSICIAN Please sign FL2     Additional Comment:    _______________________________________________ Candie Chroman, LCSW 05/25/2018, 2:20  PM

## 2018-05-25 NOTE — Progress Notes (Signed)
Patient ID: Gary Petta., male   DOB: 08-04-1932, 85 y.o.   MRN: [id number]  [facility] Progress Note   Assessment/ Plan:   1.  Acute kidney injury on chronic kidney disease stage IV: Nonoliguric, likely hemodynamically mediated acute kidney injury without clear evidence of HRS.  With good response to diuretics/successful volume unloading and today will decrease dose of torsemide as he likely is getting intravascular volume contraction.  I have previously discussed with his son/daughter and wife that he is not a candidate for chronic hemodialysis and conservative management is recommended if he progresses on to ESRD.  Appreciate input from palliative care.  If accepted to skilled nursing facility today, stable from my standpoint to discharge. 2.  Hyponatremia: Secondary to acute kidney injury and cirrhosis with free water excretion defect.  Monitor with diuresis. 3.  Anemia: Secondary to recent GI bleed and chronic illness.  With low iron saturation and ferritin, will give intravenous iron. 4.  History of cryptogenic cirrhosis 5.  Malnutrition/debilitation  Subjective:   He continues to have problems with hypoglycemia overnight, oral intake variable.   Objective:   BP (!) 98/46 (BP Location: Right Arm)   Pulse 85   Temp (!) 97.4 F (36.3 C) (Oral)   Resp 18   Ht 5\' 8"  (1.727 m)   Wt 88.2 kg   SpO2 93%   BMI 29.57 kg/m   Intake/Output Summary (Last 24 hours) at 05/25/2018 0817 Last data filed at 05/25/2018 0500 Gross per 24 hour  Intake 573.99 ml  Output 2001 ml  Net -1427.01 ml   Weight change: -0.705 kg  Physical Exam: Gen: Comfortably resting in bed, wife at bedside. CVS: Pulse regular rhythm, normal rate, S1 and S2 normal Resp: Decreased breath sounds over bases secondary to poor inspiratory effort.  No rales Abd: Soft, obese, nontender Ext: Trace ankle edema, 1+ dependent edema  Imaging: Dg Swallowing Func-speech Pathology  Result Date:  05/23/2018 Objective Swallowing Evaluation: Type of Study: MBS-Modified Barium Swallow Study  Patient Details Name: Gary Robarts. MRN: [id number] Date of Birth: 03/16/1933 Today's Date: 05/23/2018 Time: SLP Start Time (ACUTE ONLY): 1215 -SLP Stop Time (ACUTE ONLY): 1230 SLP Time Calculation (min) (ACUTE ONLY): 15 min Past Medical History: Past Medical History: Diagnosis Date . Adenomatous colon polyp  . Anemia  . AVM (arteriovenous malformation) of colon  . Chronic kidney disease, stage III (moderate) (HCC)  . Diabetes ([place])  . Dyslipidemia  . Esophageal varices ([place])  . GI bleed  . Helicobacter pylori gastritis  . Hypertension  . Iron deficiency anemia  . Obesity  . Peripheral neuropathy  . Portal hypertension ([place])  . Splenomegaly  . Thrombocytopenia ([place])  . Thrombocytopenia ([place])  . TIA (transient ischemic attack)   ? Marland Kitchen Vertigo  . Vertigo  Past Surgical History: Past Surgical History: Procedure Laterality Date . COLONOSCOPY N/A 06/10/2013  Procedure: COLONOSCOPY;  Surgeon: Lafayette Dragon, MD;  Location: [facility];  Service: Endoscopy;  Laterality: N/A; . ESOPHAGEAL BANDING  04/30/2018  Procedure: ESOPHAGEAL BANDING;  Surgeon: Lavena Bullion, DO;  Location: [facility];  Service: Gastroenterology;; . ESOPHAGOGASTRODUODENOSCOPY N/A 06/10/2013  Procedure: ESOPHAGOGASTRODUODENOSCOPY (EGD);  Surgeon: Lafayette Dragon, MD;  Location: [facility];  Service: Endoscopy;  Laterality: N/A; . ESOPHAGOGASTRODUODENOSCOPY (EGD) WITH PROPOFOL N/A 04/30/2018  Procedure: ESOPHAGOGASTRODUODENOSCOPY (EGD) WITH PROPOFOL;  Surgeon: Lavena Bullion, DO;  Location: [facility];  Service: Gastroenterology;  Laterality: N/A; . TONSILLECTOMY AND ADENOIDECTOMY   HPI: Pt is an 85 y.o. male admitted  05/19/18 with progressive weakness and fall. Worked up for AKI on CKD IV with anasarca; pt also with cryptogenic cirrhosis (recent admission with variceal bleed requiring banding). PMH includes R toe wound (recent debridement), CKD, DM2, HTN,  DM.  Subjective: pleasant, fatigued Assessment / Plan / Recommendation CHL IP CLINICAL IMPRESSIONS 05/23/2018 Clinical Impression Patient presents with a moderate oral and pharyngeal dysphagia characterized by decreased swallow initiation and delays to vallecular sinus; vallecular and pyriform sinus residuals with puree solids that slowly cleared with subsequent swallows and liquids; aspiration during swallow with thin liquids, with cough occuring after liquid had already passed through vocal cords and into trachea. SLP Visit Diagnosis Dysphagia, oropharyngeal phase (R13.12) Attention and concentration deficit following -- Frontal lobe and executive function deficit following -- Impact on safety and function Moderate aspiration risk;Severe aspiration risk   CHL IP TREATMENT RECOMMENDATION 05/23/2018 Treatment Recommendations Therapy as outlined in treatment plan below   Prognosis 05/23/2018 Prognosis for Safe Diet Advancement Fair Barriers to Reach Goals Severity of deficits Barriers/Prognosis Comment -- CHL IP DIET RECOMMENDATION 05/23/2018 SLP Diet Recommendations Dysphagia 1 (Puree) solids;Nectar thick liquid Liquid Administration via Spoon;Cup Medication Administration Crushed with puree Compensations Minimize environmental distractions;Slow rate;Small sips/bites Postural Changes Seated upright at 90 degrees   CHL IP OTHER RECOMMENDATIONS 05/23/2018 Recommended Consults -- Oral Care Recommendations Oral care BID Other Recommendations Order thickener from pharmacy;Prohibited food (jello, ice cream, thin soups);Remove water pitcher;Clarify dietary restrictions   CHL IP FOLLOW UP RECOMMENDATIONS 05/23/2018 Follow up Recommendations Skilled Nursing facility;24 hour supervision/assistance   CHL IP FREQUENCY AND DURATION 05/23/2018 Speech Therapy Frequency (ACUTE ONLY) min 2x/week Treatment Duration 1 week      CHL IP ORAL PHASE 05/23/2018 Oral Phase Impaired Oral - Pudding Teaspoon -- Oral - Pudding Cup -- Oral - Honey  Teaspoon -- Oral - Honey Cup -- Oral - Nectar Teaspoon -- Oral - Nectar Cup -- Oral - Nectar Straw -- Oral - Thin Teaspoon -- Oral - Thin Cup -- Oral - Thin Straw -- Oral - Puree Weak lingual manipulation;Delayed oral transit Oral - Mech Soft -- Oral - Regular -- Oral - Multi-Consistency -- Oral - Pill -- Oral Phase - Comment --  CHL IP PHARYNGEAL PHASE 05/23/2018 Pharyngeal Phase Impaired Pharyngeal- Pudding Teaspoon -- Pharyngeal -- Pharyngeal- Pudding Cup -- Pharyngeal -- Pharyngeal- Honey Teaspoon -- Pharyngeal -- Pharyngeal- Honey Cup -- Pharyngeal -- Pharyngeal- Nectar Teaspoon Delayed swallow initiation-vallecula;Reduced laryngeal elevation;Pharyngeal residue - valleculae;Pharyngeal residue - pyriform;Lateral channel residue Pharyngeal -- Pharyngeal- Nectar Cup Delayed swallow initiation-vallecula;Pharyngeal residue - valleculae;Pharyngeal residue - pyriform;Lateral channel residue Pharyngeal -- Pharyngeal- Nectar Straw -- Pharyngeal -- Pharyngeal- Thin Teaspoon -- Pharyngeal -- Pharyngeal- Thin Cup Delayed swallow initiation-vallecula;Delayed swallow initiation-pyriform sinuses;Penetration/Aspiration during swallow;Pharyngeal residue - valleculae;Pharyngeal residue - pyriform;Lateral channel residue Pharyngeal Material enters airway, passes BELOW cords and not ejected out despite cough attempt by patient Pharyngeal- Thin Straw -- Pharyngeal -- Pharyngeal- Puree Delayed swallow initiation-vallecula;Pharyngeal residue - valleculae;Pharyngeal residue - pyriform;Lateral channel residue Pharyngeal -- Pharyngeal- Mechanical Soft -- Pharyngeal -- Pharyngeal- Regular -- Pharyngeal -- Pharyngeal- Multi-consistency -- Pharyngeal -- Pharyngeal- Pill -- Pharyngeal -- Pharyngeal Comment --  CHL IP CERVICAL ESOPHAGEAL PHASE 05/23/2018 Cervical Esophageal Phase WFL Pudding Teaspoon -- Pudding Cup -- Honey Teaspoon -- Honey Cup -- Nectar Teaspoon -- Nectar Cup -- Nectar Straw -- Thin Teaspoon -- Thin Cup -- Thin Straw --  Puree -- Mechanical Soft -- Regular -- Multi-consistency -- Pill -- Cervical Esophageal Comment -- Sonia Baller, MA, CCC-SLP 05/23/18 6:55 PM  Labs: BMET Recent Labs  Lab 05/19/18 2056 05/20/18 0343 05/21/18 0818 05/22/18 0528 05/23/18 0431 05/24/18 0456 05/25/18 0433  NA 132* 133* 134* 134* 136 138 139  138  K 5.2* 5.0 5.2* 5.3* 4.7 4.7 4.5  4.5  CL 107 108 108 107 107 105 103  102  CO2 17* 16* 19* 20* 21* 24 26  25   GLUCOSE 79 53* 56* 68* 101* 68* 85  84  BUN 42* 43* 41* 42* 42* 44* 49*  49*  CREATININE 3.44* 3.46* 3.55* 3.55* 3.61* 3.67* 3.84*  3.75*  CALCIUM 8.6* 8.6* 8.5* 9.0 9.3 9.3 9.3  9.2  PHOS  --   --   --  4.0 3.8 3.8 3.5   CBC Recent Labs  Lab 05/20/18 0343  05/22/18 0528 05/23/18 0806 05/24/18 0456 05/25/18 0433  WBC 3.9*   < > 2.6* 3.1* 2.4* 3.4*  NEUTROABS 3.3  --   --   --   --   --   HGB 8.6*   < > 7.6* 7.8* 7.8* 7.9*  HCT 27.9*   < > 24.4* 24.9* 25.5* 24.3*  MCV 96.2   < > 95.3 94.3 93.8 93.5  PLT 41*   < > 32* 29* 27* 35*   < > = values in this interval not displayed.   Medications:    . feeding supplement (ENSURE ENLIVE)  237 mL Oral BID BM  . levothyroxine  50 mcg Oral Q0600  . midodrine  5 mg Oral TID WC  . multivitamin with minerals  1 tablet Oral q morning - 10a  . pantoprazole  40 mg Oral BID  . simvastatin  20 mg Oral q1800  . torsemide  40 mg Oral BID   Elmarie Shiley, MD 05/25/2018, 8:17 AM

## 2018-05-25 NOTE — Clinical Social Work Note (Signed)
Patient and his wife are now agreeable to SNF. Provided CMS Medicare scores for facilities within 25 miles of their zip code. They would like to wait to review with their son when he arrives around 5:30. They gave permission for CSW to go ahead and send out referral. Left contact card for son to call if there is a first preference facility. Patient and his wife are hopeful he can return home soon.  Dayton Scrape, [place]

## 2018-05-25 NOTE — Progress Notes (Signed)
CBG 59, gave D50 IV, afterward went to 66 gave some chocolate Ensure and a chocolate pudding, went back to 79, will continue to monitor, Thanks Arvella Nigh RN.

## 2018-05-25 NOTE — Progress Notes (Signed)
PROGRESS NOTE    [name].  HQI:[id number] DOB: March 28, 1933 DOA: 05/19/2018 PCP: Marton Redwood, MD  Brief Narrative: This is an 85 year old chronically ill male with history of cryptogenic cirrhosis, chronic kidney disease stage IV, type 2 diabetes, pancytopenia, hypertension, diabetes mellitus was hospitalized 3 weeks ago with upper GI bleed secondary to variceal bleed, treated with banding and transfused 3 units of PRBC, subsequently discharged home in a stable condition, brought to the ED by family with weakness, Fall, no loss of consciousness. -Upon evaluation in the emergency room he was noted to have extensive edema extending to his scrotum including abdominal wall swelling, also found to have worsening creatinine to 3.4 -Nephrology consulting, started on Lasix and albumin  Assessment & Plan:   Acute on chronic kidney disease stage IV with anasarca third spacing, in the background of cryptogenic cirrhosis -Appreciate nephrology input, suspected to be hemodynamically mediated, hepatorenal syndrome appears less likely -Renal ultrasound without hydronephrosis -Diuresed well on regimen of Lasix and albumin, diuresing well on regimen of Lasix and albumin, negative 8L  -Transitioned to oral torsemide, dose lowered, appreciate nephrology input  -Unfortunately with advanced age, numerous comorbidities including stage IV kidney disease, cirrhosis, dysphagia, severe malnutrition and failure to thrive patient's long-term prognosis is poor, he is not considered to be a dialysis candidate, this was discussed multiple times with family by nephrology and myself  -Poor health literacy, complicating current scenario and above discussions -Palliative medicine consulted for goals of care, s/p goals of care meeting yesterday -Strongly recommended DNR to family multiple times, leftmessage for daughter and [place] this morning -Discharge planning, SNF with palliative care follow-up  Cryptogenic cirrhosis  with portal hypertension, recent variceal bleed status post recent banding -As above, continue Protonix -Hemoglobin had trended down a little bit, no active bleeding, anemia panel suggestive of chronic disease, and recent GI bleed contributing -Ammonia level was within normal limits -hb is stable  Dysphagia -Oropharyngeal, SLP evaluation completed, dysphagia 1 diet with nectar nectar thick liquids recommended  Sinus bradycardia -Beta-blocker on hold  Right foot great toe wound -Recent debridement, completed recent course of doxycycline, continue doxycycline yesterday  Diabetes mellitus type 2 -No longer on medications, CBGs running slightly lower with poor oral intake and cirrhosis causing depletion of glycogen stores  Pancytopenia/thrombocytopenia -Platelet count stable in the 30,000 range slightly worse from recent hospitalization, this is secondary to cirrhosis/splenic sequestration -Monitor  Hypothyroidism -Started Synthroid  DVT prophylaxis: SCDs Code Status: Full code, strongly recommended consideration of DNR Family Communication: Son and wife Disposition Plan: Plan for SNF tomorrow if stable  Consultants:   Nephrology  Palliative medicine   Procedures:   Antimicrobials:    Subjective: -Feels weak and tired, eating a little better, breathing improving  Objective: Vitals:   05/24/18 0541 05/24/18 1101 05/24/18 1935 05/25/18 0350  BP: (!) 99/47 118/61 108/71 (!) 98/46  Pulse: 68 75 82 85  Resp: 18 18 18 18   Temp: (!) 97.5 F (36.4 C) 97.8 F (36.6 C) (!) 97.5 F (36.4 C) (!) 97.4 F (36.3 C)  TempSrc:  Oral Oral Oral  SpO2: 97% 99% 98% 93%  Weight: 88.9 kg   88.2 kg  Height:        Intake/Output Summary (Last 24 hours) at 05/25/2018 1101 Last data filed at 05/25/2018 0927 Gross per 24 hour  Intake 753.99 ml  Output 1100 ml  Net -346.01 ml   Filed Weights   05/23/18 0637 05/24/18 0541 05/25/18 0350  Weight: 90.8 kg 88.9 kg 88.2  kg     Examination:  Gen: Elderly frail chronically ill-appearing male, sitting up in bed alert awake oriented x2 HEENT: PERRLA, Neck supple, no JVD Lungs: Improving air movement, decreased breath sounds at both bases CVS: RRR,No Gallops,Rubs or new Murmurs Abd: Soft, mildly distended, positive fluid thrill, nontender, bowel sounds present Extremities: Trace edema now, scrotal edema is significantly improved  skin: no new rashes Psychiatry: Flat affect Neuro: moves all extremities, no localizing signs, no asterixis noted today    Data Reviewed:   CBC: Recent Labs  Lab 05/20/18 0343 05/21/18 0818 05/22/18 0528 05/23/18 0806 05/24/18 0456 05/25/18 0433  WBC 3.9* 2.4* 2.6* 3.1* 2.4* 3.4*  NEUTROABS 3.3  --   --   --   --   --   HGB 8.6* 7.6* 7.6* 7.8* 7.8* 7.9*  HCT 27.9* 24.1* 24.4* 24.9* 25.5* 24.3*  MCV 96.2 94.9 95.3 94.3 93.8 93.5  PLT 41* 30* 32* 29* 27* 35*   Basic Metabolic Panel: Recent Labs  Lab 05/20/18 0343 05/21/18 0818 05/22/18 0528 05/23/18 0431 05/24/18 0456 05/25/18 0433  NA 133* 134* 134* 136 138 139  138  K 5.0 5.2* 5.3* 4.7 4.7 4.5  4.5  CL 108 108 107 107 105 103  102  CO2 16* 19* 20* 21* 24 26  25   GLUCOSE 53* 56* 68* 101* 68* 85  84  BUN 43* 41* 42* 42* 44* 49*  49*  CREATININE 3.46* 3.55* 3.55* 3.61* 3.67* 3.84*  3.75*  CALCIUM 8.6* 8.5* 9.0 9.3 9.3 9.3  9.2  MG 1.5*  --   --   --   --   --   PHOS  --   --  4.0 3.8 3.8 3.5   GFR: Estimated Creatinine Clearance: 16.1 mL/min (A) (by C-G formula based on SCr of 3.75 mg/dL (H)). Liver Function Tests: Recent Labs  Lab 05/19/18 2056 05/20/18 0343 05/21/18 0818 05/22/18 0528 05/23/18 0431 05/24/18 0456 05/25/18 0433  AST 64* 63* 53*  --   --   --  52*  ALT 26 24 23   --   --   --  22  ALKPHOS 52 44 40  --   --   --  46  BILITOT 0.6 1.1 0.8  --   --   --  1.4*  PROT 5.2* 4.8* 5.1*  --   --   --  5.6*  ALBUMIN 2.5* 2.4* 3.0* 3.3* 3.5 3.5 3.2*  3.1*   No results for input(s):  LIPASE, AMYLASE in the last 168 hours. Recent Labs  Lab 05/21/18 1010  AMMONIA 38*   Coagulation Profile: Recent Labs  Lab 05/19/18 2056  INR 1.48   Cardiac Enzymes: Recent Labs  Lab 05/20/18 0343  TROPONINI <0.03   BNP (last 3 results) No results for input(s): PROBNP in the last 8760 hours. HbA1C: No results for input(s): HGBA1C in the last 72 hours. CBG: Recent Labs  Lab 05/25/18 0351 05/25/18 0455 05/25/18 0457 05/25/18 0549 05/25/18 0738  GLUCAP 59* 66* 66* 79 71   Lipid Profile: No results for input(s): CHOL, HDL, LDLCALC, TRIG, CHOLHDL, LDLDIRECT in the last 72 hours. Thyroid Function Tests: No results for input(s): TSH, T4TOTAL, FREET4, T3FREE, THYROIDAB in the last 72 hours. Anemia Panel: No results for input(s): VITAMINB12, FOLATE, FERRITIN, TIBC, IRON, RETICCTPCT in the last 72 hours. Urine analysis:    Component Value Date/Time   COLORURINE STRAW (A) 05/20/2018 0506   APPEARANCEUR CLEAR 05/20/2018 0506   LABSPEC 1.004 (L) 05/20/2018 8338  PHURINE 5.0 05/20/2018 0506   GLUCOSEU NEGATIVE 05/20/2018 0506   HGBUR NEGATIVE 05/20/2018 0506   BILIRUBINUR NEGATIVE 05/20/2018 0506   KETONESUR NEGATIVE 05/20/2018 0506   PROTEINUR NEGATIVE 05/20/2018 0506   NITRITE NEGATIVE 05/20/2018 0506   LEUKOCYTESUR NEGATIVE 05/20/2018 0506   Sepsis Labs: @LABRCNTIP (procalcitonin:4,lacticidven:4)  )No results found for this or any previous visit (from the past 240 hour(s)).       Radiology Studies: Dg Swallowing Func-speech Pathology  Result Date: 05/23/2018 Objective Swallowing Evaluation: Type of Study: MBS-Modified Barium Swallow Study  Patient Details Name: Edmon Magid. MRN: [id number] Date of Birth: 1932-07-07 Today's Date: 05/23/2018 Time: SLP Start Time (ACUTE ONLY): 1215 -SLP Stop Time (ACUTE ONLY): 1230 SLP Time Calculation (min) (ACUTE ONLY): 15 min Past Medical History: Past Medical History: Diagnosis Date . Adenomatous colon polyp  . Anemia  . AVM  (arteriovenous malformation) of colon  . Chronic kidney disease, stage III (moderate) (HCC)  . Diabetes ([place])  . Dyslipidemia  . Esophageal varices ([place])  . GI bleed  . Helicobacter pylori gastritis  . Hypertension  . Iron deficiency anemia  . Obesity  . Peripheral neuropathy  . Portal hypertension ([place])  . Splenomegaly  . Thrombocytopenia ([place])  . Thrombocytopenia ([place])  . TIA (transient ischemic attack)   ? Marland Kitchen Vertigo  . Vertigo  Past Surgical History: Past Surgical History: Procedure Laterality Date . COLONOSCOPY N/A 06/10/2013  Procedure: COLONOSCOPY;  Surgeon: Lafayette Dragon, MD;  Location: [facility];  Service: Endoscopy;  Laterality: N/A; . ESOPHAGEAL BANDING  04/30/2018  Procedure: ESOPHAGEAL BANDING;  Surgeon: Lavena Bullion, DO;  Location: [facility];  Service: Gastroenterology;; . ESOPHAGOGASTRODUODENOSCOPY N/A 06/10/2013  Procedure: ESOPHAGOGASTRODUODENOSCOPY (EGD);  Surgeon: Lafayette Dragon, MD;  Location: [facility];  Service: Endoscopy;  Laterality: N/A; . ESOPHAGOGASTRODUODENOSCOPY (EGD) WITH PROPOFOL N/A 04/30/2018  Procedure: ESOPHAGOGASTRODUODENOSCOPY (EGD) WITH PROPOFOL;  Surgeon: Lavena Bullion, DO;  Location: [facility];  Service: Gastroenterology;  Laterality: N/A; . TONSILLECTOMY AND ADENOIDECTOMY   HPI: Pt is an 85 y.o. male admitted 05/19/18 with progressive weakness and fall. Worked up for AKI on CKD IV with anasarca; pt also with cryptogenic cirrhosis (recent admission with variceal bleed requiring banding). PMH includes R toe wound (recent debridement), CKD, DM2, HTN, DM.  Subjective: pleasant, fatigued Assessment / Plan / Recommendation CHL IP CLINICAL IMPRESSIONS 05/23/2018 Clinical Impression Patient presents with a moderate oral and pharyngeal dysphagia characterized by decreased swallow initiation and delays to vallecular sinus; vallecular and pyriform sinus residuals with puree solids that slowly cleared with subsequent swallows and liquids; aspiration during swallow with thin  liquids, with cough occuring after liquid had already passed through vocal cords and into trachea. SLP Visit Diagnosis Dysphagia, oropharyngeal phase (R13.12) Attention and concentration deficit following -- Frontal lobe and executive function deficit following -- Impact on safety and function Moderate aspiration risk;Severe aspiration risk   CHL IP TREATMENT RECOMMENDATION 05/23/2018 Treatment Recommendations Therapy as outlined in treatment plan below   Prognosis 05/23/2018 Prognosis for Safe Diet Advancement Fair Barriers to Reach Goals Severity of deficits Barriers/Prognosis Comment -- CHL IP DIET RECOMMENDATION 05/23/2018 SLP Diet Recommendations Dysphagia 1 (Puree) solids;Nectar thick liquid Liquid Administration via Spoon;Cup Medication Administration Crushed with puree Compensations Minimize environmental distractions;Slow rate;Small sips/bites Postural Changes Seated upright at 90 degrees   CHL IP OTHER RECOMMENDATIONS 05/23/2018 Recommended Consults -- Oral Care Recommendations Oral care BID Other Recommendations Order thickener from pharmacy;Prohibited food (jello, ice cream, thin soups);Remove water pitcher;Clarify dietary restrictions   CHL IP FOLLOW UP  RECOMMENDATIONS 05/23/2018 Follow up Recommendations Skilled Nursing facility;24 hour supervision/assistance   CHL IP FREQUENCY AND DURATION 05/23/2018 Speech Therapy Frequency (ACUTE ONLY) min 2x/week Treatment Duration 1 week      CHL IP ORAL PHASE 05/23/2018 Oral Phase Impaired Oral - Pudding Teaspoon -- Oral - Pudding Cup -- Oral - Honey Teaspoon -- Oral - Honey Cup -- Oral - Nectar Teaspoon -- Oral - Nectar Cup -- Oral - Nectar Straw -- Oral - Thin Teaspoon -- Oral - Thin Cup -- Oral - Thin Straw -- Oral - Puree Weak lingual manipulation;Delayed oral transit Oral - Mech Soft -- Oral - Regular -- Oral - Multi-Consistency -- Oral - Pill -- Oral Phase - Comment --  CHL IP PHARYNGEAL PHASE 05/23/2018 Pharyngeal Phase Impaired Pharyngeal- Pudding Teaspoon --  Pharyngeal -- Pharyngeal- Pudding Cup -- Pharyngeal -- Pharyngeal- Honey Teaspoon -- Pharyngeal -- Pharyngeal- Honey Cup -- Pharyngeal -- Pharyngeal- Nectar Teaspoon Delayed swallow initiation-vallecula;Reduced laryngeal elevation;Pharyngeal residue - valleculae;Pharyngeal residue - pyriform;Lateral channel residue Pharyngeal -- Pharyngeal- Nectar Cup Delayed swallow initiation-vallecula;Pharyngeal residue - valleculae;Pharyngeal residue - pyriform;Lateral channel residue Pharyngeal -- Pharyngeal- Nectar Straw -- Pharyngeal -- Pharyngeal- Thin Teaspoon -- Pharyngeal -- Pharyngeal- Thin Cup Delayed swallow initiation-vallecula;Delayed swallow initiation-pyriform sinuses;Penetration/Aspiration during swallow;Pharyngeal residue - valleculae;Pharyngeal residue - pyriform;Lateral channel residue Pharyngeal Material enters airway, passes BELOW cords and not ejected out despite cough attempt by patient Pharyngeal- Thin Straw -- Pharyngeal -- Pharyngeal- Puree Delayed swallow initiation-vallecula;Pharyngeal residue - valleculae;Pharyngeal residue - pyriform;Lateral channel residue Pharyngeal -- Pharyngeal- Mechanical Soft -- Pharyngeal -- Pharyngeal- Regular -- Pharyngeal -- Pharyngeal- Multi-consistency -- Pharyngeal -- Pharyngeal- Pill -- Pharyngeal -- Pharyngeal Comment --  CHL IP CERVICAL ESOPHAGEAL PHASE 05/23/2018 Cervical Esophageal Phase WFL Pudding Teaspoon -- Pudding Cup -- Honey Teaspoon -- Honey Cup -- Nectar Teaspoon -- Nectar Cup -- Nectar Straw -- Thin Teaspoon -- Thin Cup -- Thin Straw -- Puree -- Mechanical Soft -- Regular -- Multi-consistency -- Pill -- Cervical Esophageal Comment -- Sonia Baller, MA, CCC-SLP 05/23/18 6:55 PM                   Scheduled Meds: . feeding supplement (ENSURE ENLIVE)  237 mL Oral BID BM  . levothyroxine  50 mcg Oral Q0600  . midodrine  5 mg Oral TID WC  . multivitamin with minerals  1 tablet Oral q morning - 10a  . pantoprazole  40 mg Oral BID  . simvastatin  20  mg Oral q1800  . [START ON 05/26/2018] torsemide  40 mg Oral Daily   Continuous Infusions:    LOS: 5 days    Time spent: 43min    Domenic Polite, MD [facility]  05/25/2018, 11:01 AM

## 2018-05-25 NOTE — Clinical Social Work Note (Signed)
Gave patient, wife, and granddaughter list of bed offers so far. Granddaughter will give list to her mom and uncle as they are the ones that will be looking into the facilities.  Dayton Scrape, [place]

## 2018-05-25 NOTE — Progress Notes (Signed)
Physical Therapy Treatment Patient Details Name: Gary Ingram. MRN: [id number] DOB: [birth year] Today's Date: 05/25/2018    History of Present Illness Pt is an 85 y.o. male admitted 05/19/18 with progressive weakness and fall. Worked up for AKI on CKD IV with anasarca; pt also with cryptogenic cirrhosis (recent admission with variceal bleed requiring banding). PMH includes R toe wound (recent debridement), CKD, DM2, HTN, DM.    PT Comments    Patient progressing slowly towards PT goals. Tolerated multiple standing bouts with Mod-max A of 2 depending on the surface height. Able to initiate gait training with Mod A of 2, assist for weight shifting and LE advancement. Fatigues quickly. Family and pt agreeable to SNF. Will follow and progress as tolerated.    Follow Up Recommendations  SNF;Supervision for mobility/OOB     Equipment Recommendations  Other (comment)(defer)    Recommendations for Other Services       Precautions / Restrictions Precautions Precautions: Fall;Other (comment) Precaution Comments: Bowel incontinence Restrictions Weight Bearing Restrictions: No    Mobility  Bed Mobility Overal bed mobility: Needs Assistance Bed Mobility: Supine to Sit     Supine to sit: Mod assist;HOB elevated     General bed mobility comments: Assist to elevate trunk and scoot bottom to EOB. Cues for technique and to reach for rail. + dizziness.   Transfers Overall transfer level: Needs assistance Equipment used: Rolling walker (2 wheeled) Transfers: Sit to/from Stand Sit to Stand: Mod assist;+2 physical assistance;+2 safety/equipment;Max assist;From elevated surface         General transfer comment: Assist to power to standing with therapists blocking bil feet secondary to slipping out into extension; stood from EOB x2, from chair x1. Cues for hand placement.   Ambulation/Gait Ambulation/Gait assistance: Mod assist;+2 physical assistance Gait Distance (Feet): 4  Feet Assistive device: Rolling walker (2 wheeled) Gait Pattern/deviations: Step-to pattern;Trunk flexed;Leaning posteriorly Gait velocity: Decreased   General Gait Details: Took steps with RW from bed to recliner with RW and modA+2; progressed to forward ambulation requiring assist for weight shifting and LE advancement. Fatigues.    Stairs             Wheelchair Mobility    Modified Rankin (Stroke Patients Only)       Balance Overall balance assessment: Needs assistance;History of Falls Sitting-balance support: Feet supported;No upper extremity supported Sitting balance-Leahy Scale: Fair     Standing balance support: During functional activity Standing balance-Leahy Scale: Poor Standing balance comment: Requires external support for standing and dynamic balance.                             Cognition Arousal/Alertness: Awake/alert Behavior During Therapy: WFL for tasks assessed/performed Overall Cognitive Status: Impaired/Different from baseline Area of Impairment: Attention;Safety/judgement;Problem solving                   Current Attention Level: Sustained   Following Commands: Follows one step commands with increased time;Follows one step commands inconsistently Safety/Judgement: Decreased awareness of deficits;Decreased awareness of safety   Problem Solving: Slow processing;Decreased initiation;Difficulty sequencing;Requires verbal cues General Comments: Slow to respond to questions and follow commands; multimodal cues needed to perform tasks.      Exercises      General Comments General comments (skin integrity, edema, etc.): Wife and granddaughter present during session. Agreeable to SNF.       Pertinent Vitals/Pain Pain Assessment: No/denies pain    Home Living  Prior Function            PT Goals (current goals can now be found in the care plan section) Progress towards PT goals: Progressing  toward goals    Frequency    Min 2X/week      PT Plan Frequency needs to be updated    Co-evaluation              AM-PAC PT "6 Clicks" Mobility   Outcome Measure  Help needed turning from your back to your side while in a flat bed without using bedrails?: A Little Help needed moving from lying on your back to sitting on the side of a flat bed without using bedrails?: A Lot Help needed moving to and from a bed to a chair (including a wheelchair)?: A Lot Help needed standing up from a chair using your arms (e.g., wheelchair or bedside chair)?: A Lot Help needed to walk in hospital room?: A Lot Help needed climbing 3-5 steps with a railing? : Total 6 Click Score: 12    End of Session Equipment Utilized During Treatment: Gait belt Activity Tolerance: Patient tolerated treatment well;Patient limited by fatigue Patient left: in chair;with call bell/phone within reach;with chair alarm set;with family/visitor present Nurse Communication: Mobility status PT Visit Diagnosis: Other abnormalities of gait and mobility (R26.89);Muscle weakness (generalized) (M62.81)     Time: 1000-1021 PT Time Calculation (min) (ACUTE ONLY): 21 min  Charges:  $Gait Training: 8-22 mins                     Wray Kearns, [place], DPT Acute Rehabilitation Services Pager [phone] Office [phone]       [place] 05/25/2018, 11:35 AM

## 2018-05-25 NOTE — NC FL2 (Signed)
[place] LEVEL OF CARE SCREENING TOOL     IDENTIFICATION  Patient Name: Gary Ingram. Birthdate: 08-16-1932 Sex: male Admission Date (Current Location): 05/19/2018  [facility] and [place] Number:  Herbalist and Address:  [facility], [place] [address] [place], [place] [address]      Provider Number: [id number]  Attending Physician Name and Address:  Domenic Polite, MD  Relative Name and Phone Number:       Current Level of Care: Hospital Recommended Level of Care: [place] Prior Approval Number:    Date Approved/Denied:   PASRR Number: [id number]  Discharge Plan: SNF    Current Diagnoses: Patient Active Problem List   Diagnosis Date Noted  . Goals of care, counseling/discussion   . Palliative care by specialist   . Acute CHF (congestive heart failure) ([place]) 05/20/2018  . Lower extremity edema 05/20/2018  . AKI (acute kidney injury) ([place]) 05/20/2018  . Anasarca 05/20/2018  . Cirrhosis of liver without ascites ([place])   . Symptomatic anemia 04/29/2018  . DM2 (diabetes mellitus, type 2) ([place]) 04/29/2018  . Hyperlipidemia 04/29/2018  . IDA (iron deficiency anemia) 09/14/2015  . Esophageal varices ([place]) 06/10/2013  . AVM (arteriovenous malformation) of colon without hemorrhage 06/10/2013  . Benign neoplasm of colon 06/10/2013  . Thrombocytopenia ([place]) 05/31/2011    Orientation RESPIRATION BLADDER Height & Weight     Self, Time, Situation, Place  Normal Incontinent, External catheter(At times) Weight: 194 lb 7.1 oz (88.2 kg) Height:  5\' 8"  (172.7 cm)  BEHAVIORAL SYMPTOMS/MOOD NEUROLOGICAL BOWEL NUTRITION STATUS  (None) (None) Incontinent Diet(DYS 1. Fluid nectar thick. Fluid restriction 1200 mL.)  AMBULATORY STATUS COMMUNICATION OF NEEDS Skin   Extensive Assist Verbally Skin abrasions, Bruising, Other (Comment)(Cracking, Excoriated, Intertriginous Dermatitis, MASD, Skin tear, Weeping. Diabetic ulcer on  right foot: Foam every 3 days.)                       Personal Care Assistance Level of Assistance  Bathing, Feeding, Dressing Bathing Assistance: Maximum assistance Feeding assistance: Limited assistance Dressing Assistance: Maximum assistance     Functional Limitations Info  Sight, Hearing, Speech Sight Info: Adequate Hearing Info: Adequate Speech Info: Adequate    SPECIAL CARE FACTORS FREQUENCY  PT (By licensed PT), OT (By licensed OT), Speech therapy     PT Frequency: 5 x week OT Frequency: 5 x week     Speech Therapy Frequency: 5 x week      Contractures Contractures Info: Not present    Additional Factors Info  Code Status, Allergies Code Status Info: Full code Allergies Info: NKDA           Current Medications (05/25/2018):  This is the current hospital active medication list Current Facility-Administered Medications  Medication Dose Route Frequency Provider Last Rate Last Dose  . acetaminophen (TYLENOL) tablet 650 mg  650 mg Oral Q6H PRN Rise Patience, MD       Or  . acetaminophen (TYLENOL) suppository 650 mg  650 mg Rectal Q6H PRN Rise Patience, MD      . feeding supplement (ENSURE ENLIVE) (ENSURE ENLIVE) liquid 237 mL  237 mL Oral BID BM Domenic Polite, MD   237 mL at 05/22/18 2116  . food thickener (THICK IT) powder   Oral PRN Domenic Polite, MD      . levothyroxine (SYNTHROID, LEVOTHROID) tablet 50 mcg  50 mcg Oral Q0600 Domenic Polite, MD   50 mcg  at 05/25/18 0521  . midodrine (PROAMATINE) tablet 5 mg  5 mg Oral TID WC Domenic Polite, MD   5 mg at 05/25/18 1234  . multivitamin with minerals tablet 1 tablet  1 tablet Oral q morning - 10a Rise Patience, MD   1 tablet at 05/25/18 [id number]  . ondansetron (ZOFRAN) tablet 4 mg  4 mg Oral Q6H PRN Rise Patience, MD       Or  . ondansetron [facility]) injection 4 mg  4 mg Intravenous Q6H PRN Rise Patience, MD   4 mg at 05/22/18 0023  . pantoprazole (PROTONIX) EC tablet 40 mg   40 mg Oral BID Rise Patience, MD   40 mg at 05/25/18 0954  . simvastatin (ZOCOR) tablet 20 mg  20 mg Oral q1800 Rise Patience, MD   20 mg at 05/24/18 1846  . [START ON 05/26/2018] torsemide (DEMADEX) tablet 40 mg  40 mg Oral Daily Elmarie Shiley, MD         Discharge Medications: Please see discharge summary for a list of discharge medications.  Relevant Imaging Results:  Relevant Lab Results:   Additional Information SS#: [id number]  Candie Chroman, LCSW

## 2018-05-26 DIAGNOSIS — Z515 Encounter for palliative care: Secondary | ICD-10-CM

## 2018-05-26 LAB — CBC
HCT: 23.1 % — ABNORMAL LOW (ref 39.0–52.0)
Hemoglobin: 7.6 g/dL — ABNORMAL LOW (ref 13.0–17.0)
MCH: 30.5 pg (ref 26.0–34.0)
MCHC: 32.9 g/dL (ref 30.0–36.0)
MCV: 92.8 fL (ref 80.0–100.0)
NRBC: 0 % (ref 0.0–0.2)
Platelets: 32 10*3/uL — ABNORMAL LOW (ref 150–400)
RBC: 2.49 MIL/uL — ABNORMAL LOW (ref 4.22–5.81)
RDW: 20.7 % — ABNORMAL HIGH (ref 11.5–15.5)
WBC: 2.3 10*3/uL — ABNORMAL LOW (ref 4.0–10.5)

## 2018-05-26 LAB — RENAL FUNCTION PANEL
ANION GAP: 11 (ref 5–15)
Albumin: 3.1 g/dL — ABNORMAL LOW (ref 3.5–5.0)
BUN: 56 mg/dL — ABNORMAL HIGH (ref 8–23)
CO2: 26 mmol/L (ref 22–32)
Calcium: 9.1 mg/dL (ref 8.9–10.3)
Chloride: 103 mmol/L (ref 98–111)
Creatinine, Ser: 3.66 mg/dL — ABNORMAL HIGH (ref 0.61–1.24)
GFR calc Af Amer: 17 mL/min — ABNORMAL LOW (ref >60)
GFR calc non Af Amer: 14 mL/min — ABNORMAL LOW (ref >60)
Glucose, Bld: 82 mg/dL (ref 70–99)
Phosphorus: 3.4 mg/dL (ref 2.5–4.6)
Potassium: 4.9 mmol/L (ref 3.5–5.1)
Sodium: 140 mmol/L (ref 135–145)

## 2018-05-26 LAB — GLUCOSE, CAPILLARY
GLUCOSE-CAPILLARY: 76 mg/dL (ref 70–99)
Glucose-Capillary: 57 mg/dL — ABNORMAL LOW (ref 70–99)
Glucose-Capillary: 68 mg/dL — ABNORMAL LOW (ref 70–99)
Glucose-Capillary: 62 mg/dL — ABNORMAL LOW (ref 70–99)
Glucose-Capillary: 67 mg/dL — ABNORMAL LOW (ref 70–99)
Glucose-Capillary: 72 mg/dL (ref 70–99)
Glucose-Capillary: 74 mg/dL (ref 70–99)

## 2018-05-26 MED ORDER — DEXTROSE 10 % IV SOLN
INTRAVENOUS | Status: DC
  Administered 2018-05-26: 03:00:00 via INTRAVENOUS

## 2018-05-26 NOTE — Progress Notes (Signed)
Patient blood sugar dropped again to 57. Paged MD and placed new orders for dextrose 10 at 75 ml/hr. Will continue to monitor.

## 2018-05-26 NOTE — Progress Notes (Signed)
[facility] NEPHROLOGY PROGRESS NOTE  Assessment/ Plan:  # Acute kidney injury on chronic kidney disease stage IV: Nonoliguric, likely hemodynamically mediated acute kidney injury without clear evidence of HRS.  -Serum creatinine level is is stable at 3.6, volume status acceptable on torsemide.  Given comorbidities, age, physical deconditioning,  I do not think patient is a candidate for long-term hemodialysis treatment.  Now CKD is a stable with current regimen.  Dr. Posey Pronto also discussed with the family before. Seen by palliative care. Ok to discharged to SNF.   # Hyponatremia: improved.  #Anemia: Secondary to recent GI bleed and chronic illness.  With low iron saturation and ferritin, received intravenous iron.  #History of cryptogenic cirrhosis # Malnutrition/debilitation  Continue current dose of torsemide.  CKD is stable.  I will sign off at this time.  Call us with question.  Subjective: Seen and examined.  Denied chest pain, shortness of breath.  He has some cough.  Review of system limited.  Wife at bedside. Objective Vital signs in last 24 hours: Vitals:   05/26/18 0331 05/26/18 0331 05/26/18 0855 05/26/18 1220  BP:  (!) 117/49 (!) 106/33 99/65  Pulse:  80 74 85  Resp:  18  18  Temp:  97.7 F (36.5 C)  98.3 F (36.8 C)  TempSrc:  Oral  Oral  SpO2:  100% 99% 97%  Weight: 90.2 kg     Height:       Weight change: 2 kg  Intake/Output Summary (Last 24 hours) at 05/26/2018 1242 Last data filed at 05/26/2018 1050 Gross per 24 hour  Intake 360 ml  Output 1201 ml  Net -841 ml       Labs: Basic Metabolic Panel: Recent Labs  Lab 05/24/18 0456 05/25/18 0433 05/26/18 0407  NA 138 139  138 140  K 4.7 4.5  4.5 4.9  CL 105 103  102 103  CO2 24 26  25 26   GLUCOSE 68* 85  84 82  BUN 44* 49*  49* 56*  CREATININE 3.67* 3.84*  3.75* 3.66*  CALCIUM 9.3 9.3  9.2 9.1  PHOS 3.8 3.5 3.4   Liver Function Tests: Recent Labs  Lab 05/20/18 0343  05/21/18 0818  05/24/18 0456 05/25/18 0433 05/26/18 0407  AST 63* 53*  --   --  52*  --   ALT 24 23  --   --  22  --   ALKPHOS 44 40  --   --  46  --   BILITOT 1.1 0.8  --   --  1.4*  --   PROT 4.8* 5.1*  --   --  5.6*  --   ALBUMIN 2.4* 3.0*   < > 3.5 3.2*  3.1* 3.1*   < > = values in this interval not displayed.   No results for input(s): LIPASE, AMYLASE in the last 168 hours. Recent Labs  Lab 05/21/18 1010  AMMONIA 38*   CBC: Recent Labs  Lab 05/20/18 0343  05/22/18 0528 05/23/18 0806 05/24/18 0456 05/25/18 0433 05/26/18 0407  WBC 3.9*   < > 2.6* 3.1* 2.4* 3.4* 2.3*  NEUTROABS 3.3  --   --   --   --   --   --   HGB 8.6*   < > 7.6* 7.8* 7.8* 7.9* 7.6*  HCT 27.9*   < > 24.4* 24.9* 25.5* 24.3* 23.1*  MCV 96.2   < > 95.3 94.3 93.8 93.5 92.8  PLT 41*   < > 32*  29* 27* 35* 32*   < > = values in this interval not displayed.   Cardiac Enzymes: Recent Labs  Lab 05/20/18 0343  TROPONINI <0.03   CBG: Recent Labs  Lab 05/25/18 2229 05/26/18 0253 05/26/18 0427 05/26/18 0747 05/26/18 1132  GLUCAP 78 57* 68* 76 62*    Iron Studies: No results for input(s): IRON, TIBC, TRANSFERRIN, FERRITIN in the last 72 hours. Studies/Results: No results found.  Medications: Infusions:   Scheduled Medications: . feeding supplement (ENSURE ENLIVE)  237 mL Oral BID BM  . levothyroxine  50 mcg Oral Q0600  . midodrine  5 mg Oral TID WC  . multivitamin with minerals  1 tablet Oral q morning - 10a  . pantoprazole  40 mg Oral BID  . simvastatin  20 mg Oral q1800  . torsemide  40 mg Oral Daily    have reviewed scheduled and prn medications.  Physical Exam: General:NAD, comfortable Heart:RRR, s1s2 nl Lungs: Coarse breath sound basal, no wheezing Abdomen:soft, Non-tender, non-distended Extremities:No edema  Dron Tanna Furry 05/26/2018,12:43 PM  LOS: 6 days

## 2018-05-26 NOTE — Progress Notes (Addendum)
PROGRESS NOTE    [name].  PYP:[id number] DOB: 04-16-1933 DOA: 05/19/2018 PCP: Marton Redwood, MD  Brief Narrative: This is an 85 year old chronically ill male with history of cryptogenic cirrhosis, chronic kidney disease stage IV, type 2 diabetes, pancytopenia, hypertension, diabetes mellitus was hospitalized 3 weeks ago with upper GI bleed secondary to variceal bleed, treated with banding and transfused 3 units of PRBC, subsequently discharged home in a stable condition, brought to the ED by family with weakness, Fall, no loss of consciousness. -Upon evaluation in the emergency room he was noted to have extensive edema extending to his scrotum including abdominal wall swelling, also found to have worsening creatinine to 3.4 -Nephrology consulting, started on Lasix and albumin, OVERALL prognosis is poor -Not an HD candidate, plan for SNF with Palliative care FU  Assessment & Plan:   Acute on chronic kidney disease stage IV with anasarca third spacing, in the background of cryptogenic cirrhosis -Appreciate nephrology input, suspected to be hemodynamically mediated, hepatorenal syndrome appears less likely -Renal ultrasound without hydronephrosis -Diuresed on a regimen of Lasix high-dose with albumin, -8 L, transition to oral torsemide now -Unfortunately with advanced age, numerous comorbidities including stage IV kidney disease, cirrhosis, dysphagia, severe malnutrition and failure to thrive patient's long-term prognosis is poor, he is not considered to be a dialysis candidate, this was discussed multiple times with family by nephrology and myself  -Poor health literacy, complicating current scenario and above discussions -Palliative medicine consulted for goals of care, s/p goals of care meeting 1/26 -Strongly recommended DNR to family multiple times, pt finally agrees to this -Discharge planning, SNF with palliative care follow-up  Cryptogenic cirrhosis with portal hypertension,  recent variceal bleed status post recent banding -As above, continue Protonix -Hemoglobin had trended down a little bit, no active bleeding, anemia panel suggestive of chronic disease, and recent GI bleed contributing -Ammonia level was within normal limits -hb is stable  Dysphagia -Oropharyngeal, SLP evaluation completed, dysphagia 1 diet with nectar nectar thick liquids recommended  Sinus bradycardia -Beta-blocker on hold  Right foot great toe wound -Recent debridement, completed recent course of doxycycline, continue doxycycline yesterday  Diabetes mellitus type 2 -No longer on medications, CBGs running slightly lower with poor oral intake and cirrhosis causing depletion of glycogen stores -Encourage p.o. intake and Ensure between meals  Pancytopenia/thrombocytopenia -Platelet count stable in the 30,000 range slightly worse from recent hospitalization, this is secondary to cirrhosis/splenic sequestration -Monitor  Hypothyroidism -Started Synthroid  DVT prophylaxis: SCDs Code Status: DNR Family Communication: Son and wife Disposition Plan: Plan for SNF 1/29 if stable  Consultants:   Nephrology  Palliative medicine   Procedures:   Antimicrobials:    Subjective: -weak and tired, breathing a bit better  Objective: Vitals:   05/26/18 0331 05/26/18 0331 05/26/18 0855 05/26/18 1220  BP:  (!) 117/49 (!) 106/33 99/65  Pulse:  80 74 85  Resp:  18  18  Temp:  97.7 F (36.5 C)  98.3 F (36.8 C)  TempSrc:  Oral  Oral  SpO2:  100% 99% 97%  Weight: 90.2 kg     Height:        Intake/Output Summary (Last 24 hours) at 05/26/2018 1351 Last data filed at 05/26/2018 1300 Gross per 24 hour  Intake 810 ml  Output 1201 ml  Net -391 ml   Filed Weights   05/24/18 0541 05/25/18 0350 05/26/18 0331  Weight: 88.9 kg 88.2 kg 90.2 kg    Examination: Gen: Frail, chronically ill-appearing male, sitting up  in bed, oriented x2 HEENT: PERRLA, Neck supple, no JVD Lungs:  Improved air movement, decreased at both bases CVS: RRR,No Gallops,Rubs or new Murmurs Abd: Soft, mildly distended, positive fluid thrill, nontender, bowel sounds present, improved scrotal edema  Extremities: trace edema  Psychiatry: Flat affect Neuro: moves all extremities, no localizing signs, no asterixis noted today    Data Reviewed:   CBC: Recent Labs  Lab 05/20/18 0343  05/22/18 0528 05/23/18 0806 05/24/18 0456 05/25/18 0433 05/26/18 0407  WBC 3.9*   < > 2.6* 3.1* 2.4* 3.4* 2.3*  NEUTROABS 3.3  --   --   --   --   --   --   HGB 8.6*   < > 7.6* 7.8* 7.8* 7.9* 7.6*  HCT 27.9*   < > 24.4* 24.9* 25.5* 24.3* 23.1*  MCV 96.2   < > 95.3 94.3 93.8 93.5 92.8  PLT 41*   < > 32* 29* 27* 35* 32*   < > = values in this interval not displayed.   Basic Metabolic Panel: Recent Labs  Lab 05/20/18 0343  05/22/18 0528 05/23/18 0431 05/24/18 0456 05/25/18 0433 05/26/18 0407  NA 133*   < > 134* 136 138 139  138 140  K 5.0   < > 5.3* 4.7 4.7 4.5  4.5 4.9  CL 108   < > 107 107 105 103  102 103  CO2 16*   < > 20* 21* 24 26  25 26   GLUCOSE 53*   < > 68* 101* 68* 85  84 82  BUN 43*   < > 42* 42* 44* 49*  49* 56*  CREATININE 3.46*   < > 3.55* 3.61* 3.67* 3.84*  3.75* 3.66*  CALCIUM 8.6*   < > 9.0 9.3 9.3 9.3  9.2 9.1  MG 1.5*  --   --   --   --   --   --   PHOS  --   --  4.0 3.8 3.8 3.5 3.4   < > = values in this interval not displayed.   GFR: Estimated Creatinine Clearance: 16.7 mL/min (A) (by C-G formula based on SCr of 3.66 mg/dL (H)). Liver Function Tests: Recent Labs  Lab 05/19/18 2056 05/20/18 0343 05/21/18 0818 05/22/18 0528 05/23/18 0431 05/24/18 0456 05/25/18 0433 05/26/18 0407  AST 64* 63* 53*  --   --   --  52*  --   ALT 26 24 23   --   --   --  22  --   ALKPHOS 52 44 40  --   --   --  46  --   BILITOT 0.6 1.1 0.8  --   --   --  1.4*  --   PROT 5.2* 4.8* 5.1*  --   --   --  5.6*  --   ALBUMIN 2.5* 2.4* 3.0* 3.3* 3.5 3.5 3.2*  3.1* 3.1*   No results  for input(s): LIPASE, AMYLASE in the last 168 hours. Recent Labs  Lab 05/21/18 1010  AMMONIA 38*   Coagulation Profile: Recent Labs  Lab 05/19/18 2056  INR 1.48   Cardiac Enzymes: Recent Labs  Lab 05/20/18 0343  TROPONINI <0.03   BNP (last 3 results) No results for input(s): PROBNP in the last 8760 hours. HbA1C: No results for input(s): HGBA1C in the last 72 hours. CBG: Recent Labs  Lab 05/25/18 2229 05/26/18 0253 05/26/18 0427 05/26/18 0747 05/26/18 1132  GLUCAP 78 57* 68* 76 62*   Lipid Profile:  No results for input(s): CHOL, HDL, LDLCALC, TRIG, CHOLHDL, LDLDIRECT in the last 72 hours. Thyroid Function Tests: No results for input(s): TSH, T4TOTAL, FREET4, T3FREE, THYROIDAB in the last 72 hours. Anemia Panel: No results for input(s): VITAMINB12, FOLATE, FERRITIN, TIBC, IRON, RETICCTPCT in the last 72 hours. Urine analysis:    Component Value Date/Time   COLORURINE STRAW (A) 05/20/2018 0506   APPEARANCEUR CLEAR 05/20/2018 0506   LABSPEC 1.004 (L) 05/20/2018 0506   PHURINE 5.0 05/20/2018 0506   GLUCOSEU NEGATIVE 05/20/2018 0506   HGBUR NEGATIVE 05/20/2018 0506   BILIRUBINUR NEGATIVE 05/20/2018 0506   KETONESUR NEGATIVE 05/20/2018 0506   PROTEINUR NEGATIVE 05/20/2018 0506   NITRITE NEGATIVE 05/20/2018 0506   LEUKOCYTESUR NEGATIVE 05/20/2018 0506   Sepsis Labs: @LABRCNTIP (procalcitonin:4,lacticidven:4)  )No results found for this or any previous visit (from the past 240 hour(s)).       Radiology Studies: No results found.      Scheduled Meds: . feeding supplement (ENSURE ENLIVE)  237 mL Oral BID BM  . levothyroxine  50 mcg Oral Q0600  . midodrine  5 mg Oral TID WC  . multivitamin with minerals  1 tablet Oral q morning - 10a  . pantoprazole  40 mg Oral BID  . simvastatin  20 mg Oral q1800  . torsemide  40 mg Oral Daily   Continuous Infusions:    LOS: 6 days    Time spent: 68min    Domenic Polite, MD [facility]  05/26/2018,  1:51 PM

## 2018-05-26 NOTE — Care Management Important Message (Signed)
Important Message  Patient Details  Name: Gary Ingram. MRN: [id number] Date of Birth: 09-22-1932   Medicare Important Message Given:  Yes    [name] 05/26/2018, 10:49 AM

## 2018-05-26 NOTE — Progress Notes (Signed)
Patient's blood sugar was 62. Gave pt 1 tube of Glutose. Rechecked blood sugar and it came up to 78. Will continue to monitor.

## 2018-05-26 NOTE — Progress Notes (Signed)
Nutrition Follow-up  Late Entry for 2018-06-11  DOCUMENTATION CODES:   Not applicable  INTERVENTION:   -Continue Ensure Enlive po BID, each supplement provides 350 kcal and 20 grams of protein -Continue MVI with minerals daily -Hormel Shake TID with meals, each supplement provides 520 kcals and 22 grams protein -Magic cup TID with meals, each supplement provides 290 kcal and 9 grams of protein  NUTRITION DIAGNOSIS:   Inadequate oral intake related to decreased appetite(masticatory difficulty) as evidenced by meal completion < 50%, per patient/family report.  Ongoing  GOAL:   Patient will meet greater than or equal to 90% of their needs  Progressing  MONITOR:   PO intake, Supplement acceptance, Weight trends, Labs, Skin, I & O's  REASON FOR ASSESSMENT:   Consult Assessment of nutrition requirement/status  ASSESSMENT:   Gary Ingram. is a 85 y.o. male with history of cryptogenic cirrhosis, diabetes mellitus type 2, pancytopenia, hypertension who was recently admitted for GI bleed 3 weeks ago and underwent EGD and esophageal varices banding and had required 3 units of PRBC transfusion was brought to the ER after patient was feeling weak and had a fall after getting up from the commode.  Family states family was with the patient when the incident happened and patient did not hit his head or lose consciousness.  Over the last few days patient has been getting more short of breath and increasing peripheral edema and also has been having some scrotal edema and abdominal distention.  Patient also has recently followed up with podiatrist who had his right great toe wound debridement and was placed on doxycycline.  1/25- s/p MBSS- diet downgraded to dysphagia 1 diet with nectar thick liquids  Reviewed I/O's: -661 ml x 24 hours and -8.7 L since admission  Pt receiving nursing care at time of visit.   Case discussed with RN, who reports appetite is poor,however, is taking thickened  Ensure well. Noted meal completion 50% and pt with multiple hypoglycemic episodes.  Plan for d/c to SNF once medically stable. Palliative care consult also pending.   Labs reviewed: CBGS: 57-78.   Diet Order:   Diet Order            DIET - DYS 1 Room service appropriate? Yes; Fluid consistency: Nectar Thick; Fluid restriction: 1200 mL Fluid  Diet effective now              EDUCATION NEEDS:   Education needs have been addressed  Skin:  Skin Assessment: Skin Integrity Issues: Skin Integrity Issues:: Other (Comment) Other: MASD buttocks, plantar wound to great rt toe  Last BM:  06/11/2018  Height:   Ht Readings from Last 1 Encounters:  05/20/18 5\' 8"  (1.727 m)    Weight:   Wt Readings from Last 1 Encounters:  05/27/18 82.2 kg    Ideal Body Weight:  70 kg  BMI:  Body mass index is 27.55 kg/m.  Estimated Nutritional Needs:   Kcal:  2542-7062  Protein:  85-100 grams  Fluid:  per MD    Arayla Kruschke A. Jimmye Norman, RD, LDN, CDE Pager: [phone] After hours Pager: [phone]

## 2018-05-26 NOTE — Progress Notes (Signed)
Daily Progress Note   Patient Name: Gary Ingram.       Date: 05/26/2018 DOB: [birth year]  Age: 85 y.o. MRN#: [id number] Attending Physician: Domenic Polite, MD Primary Care Physician: Marton Redwood, MD Admit Date: 05/19/2018  Reason for Consultation/Follow-up: Establishing goals of care  Subjective: Patient in bed with spouse at bedside. He at first believes his wife is in the bed with him.  Discussed DNR status with spouse and patient. They are in agreement. Discussed plan for SNF. Patient's spouse became very tearful during discussion- states she doesn't know how she will live without her husband. Gave emotional support. She requested chaplain visit but specified male chaplain stating that she doesn't believe "God talks through women".   ROS  Length of Stay: 6  Current Medications: Scheduled Meds:  . feeding supplement (ENSURE ENLIVE)  237 mL Oral BID BM  . levothyroxine  50 mcg Oral Q0600  . midodrine  5 mg Oral TID WC  . multivitamin with minerals  1 tablet Oral q morning - 10a  . pantoprazole  40 mg Oral BID  . simvastatin  20 mg Oral q1800  . torsemide  40 mg Oral Daily    Continuous Infusions:   PRN Meds: acetaminophen **OR** acetaminophen, food thickener, ondansetron **OR** ondansetron (ZOFRAN) IV  Physical Exam          Vital Signs: BP 99/65 (BP Location: Right Arm)   Pulse 85   Temp 98.3 F (36.8 C) (Oral)   Resp 18   Ht 5\' 8"  (1.727 m)   Wt 90.2 kg   SpO2 97%   BMI 30.24 kg/m  SpO2: SpO2: 97 % O2 Device: O2 Device: Room Air O2 Flow Rate: O2 Flow Rate (L/min): 2 L/min  Intake/output summary:   Intake/Output Summary (Last 24 hours) at 05/26/2018 1706 Last data filed at 05/26/2018 1300 Gross per 24 hour  Intake 690 ml  Output 1201 ml  Net -511 ml    LBM: Last BM Date: 05/26/18 Baseline Weight: Weight: 94.4 kg Most recent weight: Weight: 90.2 kg       Palliative Assessment/Data: PPS: 30%   Flowsheet Rows     Most Recent Value  Intake Tab  Referral Department  Hospitalist  Unit at Time of Referral  Med/Surg Unit  Palliative Care Primary Diagnosis  Pulmonary  Date  Notified  05/23/18  Palliative Care Type  New Palliative care  Reason for referral  Clarify Goals of Care, Psychosocial or Spiritual support  Date of Admission  05/20/18  Date first seen by Palliative Care  05/24/18  # of days Palliative referral response time  1 Day(s)  # of days IP prior to Palliative referral  3  Clinical Assessment  Palliative Performance Scale Score  30%  Pain Max last 24 hours  Not able to report  Pain Min Last 24 hours  Not able to report  Dyspnea Max Last 24 Hours  Not able to report  Dyspnea Min Last 24 hours  Not able to report  Nausea Max Last 24 Hours  Not able to report  Nausea Min Last 24 Hours  Not able to report  Anxiety Max Last 24 Hours  Not able to report  Anxiety Min Last 24 Hours  Not able to report  Other Max Last 24 Hours  Not able to report  Psychosocial & Spiritual Assessment  Palliative Care Outcomes  Patient/Family meeting held?  Yes  Who was at the meeting?  wife, son, dtr  Palliative Care Outcomes  Provided psychosocial or spiritual support  Palliative Care follow-up planned  Yes, Facility      Patient Active Problem List   Diagnosis Date Noted  . Goals of care, counseling/discussion   . Palliative care by specialist   . Acute CHF (congestive heart failure) ([place]) 05/20/2018  . Lower extremity edema 05/20/2018  . AKI (acute kidney injury) ([place]) 05/20/2018  . Anasarca 05/20/2018  . Cirrhosis of liver without ascites ([place])   . Symptomatic anemia 04/29/2018  . DM2 (diabetes mellitus, type 2) ([place]) 04/29/2018  . Hyperlipidemia 04/29/2018  . IDA (iron deficiency anemia) 09/14/2015  . Esophageal varices ([place])  06/10/2013  . AVM (arteriovenous malformation) of colon without hemorrhage 06/10/2013  . Benign neoplasm of colon 06/10/2013  . Thrombocytopenia ([place]) 05/31/2011    Palliative Care Assessment & Plan   Patient Profile:  85 y.o. male  with past medical history of cryptogenic cirrhosis, diabetes type 2, hypertension, colonic AVM, chronic kidney disease stage III, dysphasia, pancytopenia admitted on 05/19/2018 with worsening weakness and shortness of breath.  Patient also has been having increased edema.  When seen in the emergency room he exhibited anasarca including scrotal,abdominal distention, and edema.  Chest x-ray showed new left pleural effusion.  Patient was admitted earlier in January with a GI bleed and banding of esophageal varices.  Since that time his kidney function appears to be worsening.  His creatinine as of 05/24/2018 is now 3.67.  He has received albumin transfusion and albumin level is now 3.5.  Albumin upon admission  was 2.4, white blood cell count 2.4, hemoglobin 7.8, platelets 27  Consult ordered for goals of care.    Assessment/Recommendations/Plan   Recommend continued Palliative followup at SNF for [facility] consult  Goals of Care and Additional Recommendations:  Limitations on Scope of Treatment: Full Scope Treatment  Code Status:  DNR  Prognosis:   < 6 months d/t progressing cryptogenic cirrhosis in setting of CKD,  Patient not candidate for HD  Discharge Planning:  [facility] for rehab with Palliative care service follow-up  Care plan was discussed with patient and spouse.  Thank you for allowing the Palliative Medicine Team to assist in the care of this patient.   Time In: 1615 Time Out: 1700 Total Time 45 mins Prolonged Time Billed no  Greater than 50%  of this time was spent counseling and coordinating care related to the above assessment and plan.  Mariana Kaufman, AGNP-C Palliative Medicine   Please contact  Palliative Medicine Team phone at [phone] for questions and concerns.

## 2018-05-26 NOTE — Clinical Social Work Note (Signed)
Patients wife said they have not chosen a facility yet. Their children plan to tour facilities before making decision.  Gary Ingram, Gary Ingram

## 2018-05-27 LAB — GLUCOSE, CAPILLARY
GLUCOSE-CAPILLARY: 124 mg/dL — AB (ref 70–99)
Glucose-Capillary: 66 mg/dL — ABNORMAL LOW (ref 70–99)
Glucose-Capillary: 87 mg/dL (ref 70–99)
Glucose-Capillary: 79 mg/dL (ref 70–99)
Glucose-Capillary: 71 mg/dL (ref 70–99)

## 2018-05-27 LAB — BASIC METABOLIC PANEL
Anion gap: 11 (ref 5–15)
BUN: 61 mg/dL — ABNORMAL HIGH (ref 8–23)
CHLORIDE: 105 mmol/L (ref 98–111)
CO2: 26 mmol/L (ref 22–32)
Calcium: 9.5 mg/dL (ref 8.9–10.3)
Creatinine, Ser: 3.76 mg/dL — ABNORMAL HIGH (ref 0.61–1.24)
GFR calc Af Amer: 16 mL/min — ABNORMAL LOW (ref >60)
GFR calc non Af Amer: 14 mL/min — ABNORMAL LOW (ref >60)
Glucose, Bld: 113 mg/dL — ABNORMAL HIGH (ref 70–99)
Potassium: 4.6 mmol/L (ref 3.5–5.1)
Sodium: 142 mmol/L (ref 135–145)

## 2018-05-27 MED ORDER — MIDODRINE HCL 5 MG PO TABS: 5.0000 mg | ORAL_TABLET | Freq: Three times a day (TID) | ORAL | Status: AC

## 2018-05-27 MED ORDER — LEVOTHYROXINE SODIUM 50 MCG PO TABS: 50.0000 ug | ORAL_TABLET | Freq: Every day | ORAL | Status: AC

## 2018-05-27 MED ORDER — TORSEMIDE 20 MG PO TABS: 40.0000 mg | ORAL_TABLET | Freq: Every day | ORAL | Status: DC

## 2018-05-27 NOTE — Clinical Social Work Note (Addendum)
Went by room to discuss SNF choice. Chaplain at bedside. Will try again later.  Dayton Scrape, [place]  10:09 am Met with patient and wife. No SNF decision has been made yet but wife thinks her children are considering [facility]. Left message for admissions coordinator to check on male bed availability. Called son at wife's request who confirmed preference for [facility]. He said he believes his sister had picked out a second preference but cannot remember the name of the facility. He will find out and call CSW back today.  Dayton Scrape, [place] [phone]  11:20 am [facility] has a semiprivate bed available. Son wants to make sure his sister is agreeable before officially accepting. He will update CSW as soon as he can.  Dayton Scrape, [place]  11:41 pm Son called back and confirmed they will accept bed offer from [facility]. Admissions coordinator aware and will start insurance authorization. Typically takes 24 hours to obtain.  Dayton Scrape, [place] [phone]  12:38 pm Notified SNF that patient is now on an air mattress.  Dayton Scrape, [place]

## 2018-05-27 NOTE — Progress Notes (Signed)
  Speech Language Pathology Treatment: Dysphagia  Patient Details Name: Gary Ingram. MRN: [id number] DOB: Jan 20, 1933 Today's Date: 05/27/2018 Time: 5366-4403 SLP Time Calculation (min) (ACUTE ONLY): 13 min  Assessment / Plan / Recommendation Clinical Impression  Pt was seen for dyspahgia treatment to assess continued tolerance of the current diet. Pt's son and wife were present and both parties denied the pt demonstrating any signs of aspiration with meals. Pt's nurse indicated that the pt has inconsistently demonstrated coughing with p.o. intake but also demonstrates a baseline cough. He tolerated puree solids without overt signs of aspiration but exhibited coughing following 1/4 trials of slighty thick liquids (V8 juice) and with 1/2 boluses of nectar thick liquids via cup. Pt did demonstrate baseline coughing during the session so the possibility of coughing being unrelated to aspiration is considered. However, SLP will monitor closely to ensure continued tolerance of the current diet.    HPI HPI: Pt is an 85 y.o. male admitted 05/19/18 with progressive weakness and fall. Worked up for AKI on CKD IV with anasarca; pt also with cryptogenic cirrhosis (recent admission with variceal bleed requiring banding). PMH includes R toe wound (recent debridement), CKD, DM2, HTN, DM.      SLP Plan  Continue with current plan of care       Recommendations  Diet recommendations: Dysphagia 1 (puree);Nectar-thick liquid Liquids provided via: Cup;No straw;Teaspoon Medication Administration: Crushed with puree Supervision: Staff to assist with self feeding;Full supervision/cueing for compensatory strategies Compensations: Minimize environmental distractions;Slow rate;Small sips/bites Postural Changes and/or Swallow Maneuvers: Seated upright 90 degrees;Upright 30-60 min after meal                Oral Care Recommendations: Oral care BID Follow up Recommendations: Skilled Nursing facility;24 hour  supervision/assistance SLP Visit Diagnosis: Dysphagia, oropharyngeal phase (R13.12) Plan: Continue with current plan of care       Kinberly Perris I. Hardin Negus, [place], [place] Office number [phone] Pager [phone]            Horton Marshall 05/27/2018, 6:00 PM

## 2018-05-27 NOTE — Discharge Summary (Addendum)
Physician Discharge Summary  Gary Ingram. OVF:[id number] DOB: 01/12/1933 DOA: 05/19/2018  PCP: Marton Redwood, MD  Admit date: 05/19/2018 Discharge date: 05/28/2018  Time spent: 45 minutes  Recommendations for Outpatient Follow-up:  1. PCP Dr. Brigitte Pulse in 1 week, needs continued goals of care discussions, prognosis is poor 2. Palliative care follow-up at SNF   Discharge Diagnoses:  Principal Problem:   Anasarca   Acute kidney injury on chronic kidney disease stage IV Cryptogenic cirrhosis Pancytopenia Severe thrombocytopenia   Esophageal varices (HCC)   DM2 (diabetes mellitus, type 2) (HCC)   Cirrhosis of liver without ascites (HCC)   Acute CHF (congestive heart failure) (HCC)   Lower extremity edema   AKI (acute kidney injury) ([place])   Goals of care, counseling/discussion   Palliative care by specialist   Advanced care planning/counseling discussion   Discharge Condition: Stable  Diet recommendation: Dysphagia 1,nectar thick  Filed Weights   05/25/18 0350 05/26/18 0331 05/27/18 0446  Weight: 88.2 kg 90.2 kg 82.2 kg    History of present illness:  85 year old chronically ill male with history of cryptogenic cirrhosis, chronic kidney disease stage IV, type 2 diabetes, pancytopenia, hypertension, diabetes mellitus was hospitalized 3 weeks ago with upper GI bleed secondary to variceal bleed, treated with banding and transfused 3 units of PRBC, subsequently discharged home in a stable condition, brought to the ED by family with weakness, Fall, no loss of consciousness. -Upon evaluation in the emergency room he was noted to have extensive edema extending to his scrotum including abdominal wall swelling, also found to have worsening creatinine to 3.4  Hospital Course:   Acute on chronic kidney disease stage IV with anasarca third spacing, in the background of cryptogenic cirrhosis -Nephrology consulted, suspected to be hemodynamically mediated, hepatorenal syndrome felt to be  less likely based on urine studies -Renal ultrasound without hydronephrosis -Diuresed on a regimen of Lasix high-dose with albumin, -8 L, transitioned to oral torsemide now, volume status has improved, creatinine in the 3.6-3.7 range now. -Unfortunately with advanced age, numerous comorbidities including stage IV kidney disease, cirrhosis, dysphagia, severe malnutrition and failure to thrive patient's long-term prognosis is poor, he is not considered to be a dialysis candidate, this was discussed multiple times with family by nephrology and myself  -Poor health literacy, complicating current scenario and above discussions -Palliative medicine consulted for goals of care, s/p goals of care meeting 1/26 -Strongly recommended DNR to family multiple times, pt finally agreed to this yesterday morning, later in the evening wife and son changed pts mind and he now wants CPR, chest compression, defibrillation but no intubation  -Overall prognosis is very poor, plan for SNF with palliative care follow-up -We will continue to need further goals of care discussions with PCP and palliative care  Cryptogenic cirrhosis with portal hypertension, recent variceal bleed status post recent banding -As above, continue Protonix -Hemoglobin had trended down a little bit, no active bleeding, anemia panel suggestive of chronic disease, and recent GI bleed contributing -Ammonia level was within normal limits -hb is stable in the 7.5-8 range  Dysphagia -Oropharyngeal, SLP evaluation completed, dysphagia 1 diet with nectar nectar thick liquids recommended.Follow up with speech therapy at SNF.  Sinus bradycardia -Beta-blocker on hold  Right foot great toe wound -Recent debridement, completed recent course of doxycycline  Diabetes mellitus type 2 -No longer on medications, CBGs running slightly lower in the 60-80 range but asymptomatic , secondary to poor oral intake and cirrhosis causing depletion of glycogen  stores -Encourage p.o. intake  and Ensure between meals  Pancytopenia/thrombocytopenia -Platelet count stable in the 30,000 range slightly worse from recent hospitalization, this is secondary to cirrhosis/splenic sequestration -Monitor  Hypothyroidism -Started Synthroid  Discharge Exam: Vitals:   05/27/18 0446 05/27/18 0954  BP: (!) 97/43 (!) 104/52  Pulse: 86 82  Resp: 18   Temp: 97.8 F (36.6 C)   SpO2: 97%     General: Alert, awake, oriented  Cardiovascular: S1S2/RRR Respiratory: CTAB  Discharge Instructions    Allergies as of 05/27/2018   No Known Allergies     Medication List    STOP taking these medications   doxycycline 100 MG tablet Commonly known as:  VIBRA-TABS   fenofibrate 160 MG tablet   fluconazole 100 MG tablet Commonly known as:  DIFLUCAN   furosemide 40 MG tablet Commonly known as:  LASIX   metoprolol tartrate 50 MG tablet Commonly known as:  LOPRESSOR   simvastatin 20 MG tablet Commonly known as:  ZOCOR   spironolactone 25 MG tablet Commonly known as:  ALDACTONE   traMADol 50 MG tablet Commonly known as:  ULTRAM     TAKE these medications   CENTRUM ADULTS Tabs Take 1 tablet by mouth every morning.   PRESERVISION AREDS 2 PO Take 1 tablet by mouth 2 (two) times daily.   levothyroxine 50 MCG tablet Commonly known as:  SYNTHROID, LEVOTHROID Take 1 tablet (50 mcg total) by mouth daily at 6 (six) AM. Start taking on:  May 28, 2018   midodrine 5 MG tablet Commonly known as:  PROAMATINE Take 1 tablet (5 mg total) by mouth 3 (three) times daily with meals.   mupirocin ointment 2 % Commonly known as:  BACTROBAN Apply 1 application topically 2 (two) times daily as needed (for toe).   ondansetron 8 MG disintegrating tablet Commonly known as:  ZOFRAN-ODT Take 8 mg by mouth every 8 (eight) hours as needed for nausea or vomiting.   pantoprazole 40 MG tablet Commonly known as:  PROTONIX Take 1 tablet (40 mg total) by mouth 2  (two) times daily.   POLY-IRON 150 150 MG capsule Generic drug:  iron polysaccharides Take 150 mg by mouth daily.   torsemide 20 MG tablet Commonly known as:  DEMADEX Take 2 tablets (40 mg total) by mouth daily. Start taking on:  May 28, 2018   triamcinolone cream 0.1 % Commonly known as:  KENALOG Apply 1 application topically 2 (two) times daily as needed (rash).      No Known Allergies Follow-up Information    Marton Redwood, MD.   Specialty:  Internal Medicine Contact information: [place] [address] [facility].   Why:  They will do your home health care at your home Contact information: [address] [place] [address] [phone]            The results of significant diagnostics from this hospitalization (including imaging, microbiology, ancillary and laboratory) are listed below for reference.    Significant Diagnostic Studies: Dg Chest 2 View  Result Date: 05/19/2018 CLINICAL DATA:  Chronic productive cough EXAM: CHEST - 2 VIEW COMPARISON:  08/27/2006 FINDINGS: Small left effusion, new since 2008. No overt pulmonary edema. Left basilar atelectasis is identified. Stable mild cardiomegaly with uncoiled thoracic aorta. Osteoarthritis of the [facility] and glenohumeral joints. IMPRESSION: Small left effusion, new since 2008. Left basilar atelectasis. Electronically Signed   By: Ashley Royalty M.D.   On:  05/19/2018 21:41   US Renal  Result Date: 05/20/2018 CLINICAL DATA:  Acute renal injury EXAM: RENAL / URINARY TRACT ULTRASOUND COMPLETE COMPARISON:  None. FINDINGS: Right Kidney: Renal measurements: 9.3 x 3.7 x 4.4 cm = volume: 78.8 mL . Echogenicity within normal limits. No mass or hydronephrosis visualized. Left Kidney: Renal measurements: 9.1 x 4.0 x 4.4 cm = volume: 84.0 mL. Echogenicity within normal limits. No mass or hydronephrosis visualized. Bladder: Appears normal for degree of  bladder distention. IMPRESSION: 1. Nodular liver consistent with cirrhosis. Bilateral pleural effusions and significant ascites. 2. No renal abnormalities. Electronically Signed   By: Dorise Bullion III M.D   On: 05/20/2018 15:11   Dg Swallowing Func-speech Pathology  Result Date: 05/23/2018 Objective Swallowing Evaluation: Type of Study: MBS-Modified Barium Swallow Study  Patient Details Name: Gary Ingram. MRN: [id number] Date of Birth: 07-26-1932 Today's Date: 05/23/2018 Time: SLP Start Time (ACUTE ONLY): 1215 -SLP Stop Time (ACUTE ONLY): 1230 SLP Time Calculation (min) (ACUTE ONLY): 15 min Past Medical History: Past Medical History: Diagnosis Date . Adenomatous colon polyp  . Anemia  . AVM (arteriovenous malformation) of colon  . Chronic kidney disease, stage III (moderate) (HCC)  . Diabetes ([place])  . Dyslipidemia  . Esophageal varices ([place])  . GI bleed  . Helicobacter pylori gastritis  . Hypertension  . Iron deficiency anemia  . Obesity  . Peripheral neuropathy  . Portal hypertension ([place])  . Splenomegaly  . Thrombocytopenia ([place])  . Thrombocytopenia ([place])  . TIA (transient ischemic attack)   ? Marland Kitchen Vertigo  . Vertigo  Past Surgical History: Past Surgical History: Procedure Laterality Date . COLONOSCOPY N/A 06/10/2013  Procedure: COLONOSCOPY;  Surgeon: Lafayette Dragon, MD;  Location: [facility];  Service: Endoscopy;  Laterality: N/A; . ESOPHAGEAL BANDING  04/30/2018  Procedure: ESOPHAGEAL BANDING;  Surgeon: Lavena Bullion, DO;  Location: [facility];  Service: Gastroenterology;; . ESOPHAGOGASTRODUODENOSCOPY N/A 06/10/2013  Procedure: ESOPHAGOGASTRODUODENOSCOPY (EGD);  Surgeon: Lafayette Dragon, MD;  Location: [facility];  Service: Endoscopy;  Laterality: N/A; . ESOPHAGOGASTRODUODENOSCOPY (EGD) WITH PROPOFOL N/A 04/30/2018  Procedure: ESOPHAGOGASTRODUODENOSCOPY (EGD) WITH PROPOFOL;  Surgeon: Lavena Bullion, DO;  Location: [facility];  Service: Gastroenterology;  Laterality: N/A; . TONSILLECTOMY AND  ADENOIDECTOMY   HPI: Pt is an 85 y.o. male admitted 05/19/18 with progressive weakness and fall. Worked up for AKI on CKD IV with anasarca; pt also with cryptogenic cirrhosis (recent admission with variceal bleed requiring banding). PMH includes R toe wound (recent debridement), CKD, DM2, HTN, DM.  Subjective: pleasant, fatigued Assessment / Plan / Recommendation CHL IP CLINICAL IMPRESSIONS 05/23/2018 Clinical Impression Patient presents with a moderate oral and pharyngeal dysphagia characterized by decreased swallow initiation and delays to vallecular sinus; vallecular and pyriform sinus residuals with puree solids that slowly cleared with subsequent swallows and liquids; aspiration during swallow with thin liquids, with cough occuring after liquid had already passed through vocal cords and into trachea. SLP Visit Diagnosis Dysphagia, oropharyngeal phase (R13.12) Attention and concentration deficit following -- Frontal lobe and executive function deficit following -- Impact on safety and function Moderate aspiration risk;Severe aspiration risk   CHL IP TREATMENT RECOMMENDATION 05/23/2018 Treatment Recommendations Therapy as outlined in treatment plan below   Prognosis 05/23/2018 Prognosis for Safe Diet Advancement Fair Barriers to Reach Goals Severity of deficits Barriers/Prognosis Comment -- CHL IP DIET RECOMMENDATION 05/23/2018 SLP Diet Recommendations Dysphagia 1 (Puree) solids;Nectar thick liquid Liquid Administration via Spoon;Cup Medication Administration Crushed with puree Compensations Minimize environmental distractions;Slow rate;Small sips/bites Postural Changes Seated  upright at 90 degrees   CHL IP OTHER RECOMMENDATIONS 05/23/2018 Recommended Consults -- Oral Care Recommendations Oral care BID Other Recommendations Order thickener from pharmacy;Prohibited food (jello, ice cream, thin soups);Remove water pitcher;Clarify dietary restrictions   CHL IP FOLLOW UP RECOMMENDATIONS 05/23/2018 Follow up Recommendations  Skilled Nursing facility;24 hour supervision/assistance   CHL IP FREQUENCY AND DURATION 05/23/2018 Speech Therapy Frequency (ACUTE ONLY) min 2x/week Treatment Duration 1 week      CHL IP ORAL PHASE 05/23/2018 Oral Phase Impaired Oral - Pudding Teaspoon -- Oral - Pudding Cup -- Oral - Honey Teaspoon -- Oral - Honey Cup -- Oral - Nectar Teaspoon -- Oral - Nectar Cup -- Oral - Nectar Straw -- Oral - Thin Teaspoon -- Oral - Thin Cup -- Oral - Thin Straw -- Oral - Puree Weak lingual manipulation;Delayed oral transit Oral - Mech Soft -- Oral - Regular -- Oral - Multi-Consistency -- Oral - Pill -- Oral Phase - Comment --  CHL IP PHARYNGEAL PHASE 05/23/2018 Pharyngeal Phase Impaired Pharyngeal- Pudding Teaspoon -- Pharyngeal -- Pharyngeal- Pudding Cup -- Pharyngeal -- Pharyngeal- Honey Teaspoon -- Pharyngeal -- Pharyngeal- Honey Cup -- Pharyngeal -- Pharyngeal- Nectar Teaspoon Delayed swallow initiation-vallecula;Reduced laryngeal elevation;Pharyngeal residue - valleculae;Pharyngeal residue - pyriform;Lateral channel residue Pharyngeal -- Pharyngeal- Nectar Cup Delayed swallow initiation-vallecula;Pharyngeal residue - valleculae;Pharyngeal residue - pyriform;Lateral channel residue Pharyngeal -- Pharyngeal- Nectar Straw -- Pharyngeal -- Pharyngeal- Thin Teaspoon -- Pharyngeal -- Pharyngeal- Thin Cup Delayed swallow initiation-vallecula;Delayed swallow initiation-pyriform sinuses;Penetration/Aspiration during swallow;Pharyngeal residue - valleculae;Pharyngeal residue - pyriform;Lateral channel residue Pharyngeal Material enters airway, passes BELOW cords and not ejected out despite cough attempt by patient Pharyngeal- Thin Straw -- Pharyngeal -- Pharyngeal- Puree Delayed swallow initiation-vallecula;Pharyngeal residue - valleculae;Pharyngeal residue - pyriform;Lateral channel residue Pharyngeal -- Pharyngeal- Mechanical Soft -- Pharyngeal -- Pharyngeal- Regular -- Pharyngeal -- Pharyngeal- Multi-consistency -- Pharyngeal --  Pharyngeal- Pill -- Pharyngeal -- Pharyngeal Comment --  CHL IP CERVICAL ESOPHAGEAL PHASE 05/23/2018 Cervical Esophageal Phase WFL Pudding Teaspoon -- Pudding Cup -- Honey Teaspoon -- Honey Cup -- Nectar Teaspoon -- Nectar Cup -- Nectar Straw -- Thin Teaspoon -- Thin Cup -- Thin Straw -- Puree -- Mechanical Soft -- Regular -- Multi-consistency -- Pill -- Cervical Esophageal Comment -- Sonia Baller, MA, CCC-SLP 05/23/18 6:55 PM               Microbiology: No results found for this or any previous visit (from the past 240 hour(s)).   Labs: Basic Metabolic Panel: Recent Labs  Lab 05/22/18 0528 05/23/18 0431 05/24/18 0456 05/25/18 0433 05/26/18 0407 05/27/18 0852  NA 134* 136 138 139  138 140 142  K 5.3* 4.7 4.7 4.5  4.5 4.9 4.6  CL 107 107 105 103  102 103 105  CO2 20* 21* 24 26  25 26 26   GLUCOSE 68* 101* 68* 85  84 82 113*  BUN 42* 42* 44* 49*  49* 56* 61*  CREATININE 3.55* 3.61* 3.67* 3.84*  3.75* 3.66* 3.76*  CALCIUM 9.0 9.3 9.3 9.3  9.2 9.1 9.5  PHOS 4.0 3.8 3.8 3.5 3.4  --    Liver Function Tests: Recent Labs  Lab 05/21/18 0818 05/22/18 0528 05/23/18 0431 05/24/18 0456 05/25/18 0433 05/26/18 0407  AST 53*  --   --   --  52*  --   ALT 23  --   --   --  22  --   ALKPHOS 40  --   --   --  46  --  BILITOT 0.8  --   --   --  1.4*  --   PROT 5.1*  --   --   --  5.6*  --   ALBUMIN 3.0* 3.3* 3.5 3.5 3.2*  3.1* 3.1*   No results for input(s): LIPASE, AMYLASE in the last 168 hours. Recent Labs  Lab 05/21/18 1010  AMMONIA 38*   CBC: Recent Labs  Lab 05/22/18 0528 05/23/18 0806 05/24/18 0456 05/25/18 0433 05/26/18 0407  WBC 2.6* 3.1* 2.4* 3.4* 2.3*  HGB 7.6* 7.8* 7.8* 7.9* 7.6*  HCT 24.4* 24.9* 25.5* 24.3* 23.1*  MCV 95.3 94.3 93.8 93.5 92.8  PLT 32* 29* 27* 35* 32*   Cardiac Enzymes: No results for input(s): CKTOTAL, CKMB, CKMBINDEX, TROPONINI in the last 168 hours. BNP: BNP (last 3 results) Recent Labs    05/19/18 2056  BNP 550.2*    ProBNP  (last 3 results) No results for input(s): PROBNP in the last 8760 hours.  CBG: Recent Labs  Lab 05/26/18 1638 05/26/18 2119 05/26/18 2129 05/27/18 0809 05/27/18 0827  GLUCAP 67* 72 74 66* 87       Signed:  Domenic Polite MD.  [facility] 05/27/2018, 11:01 AM

## 2018-05-27 NOTE — Plan of Care (Signed)
°  Problem: Clinical Measurements: °Goal: Respiratory complications will improve °Outcome: Progressing °Goal: Cardiovascular complication will be avoided °Outcome: Progressing °  °Problem: Coping: °Goal: Level of anxiety will decrease °Outcome: Progressing °  °Problem: Pain Managment: °Goal: General experience of comfort will improve °Outcome: Progressing °  °Problem: Safety: °Goal: Ability to remain free from injury will improve °Outcome: Progressing °  °

## 2018-05-27 NOTE — Progress Notes (Signed)
Occupational Therapy Treatment Patient Details Name: Gary Ingram. MRN: [id number] DOB: Nov 26, 1932 Today's Date: 05/27/2018    History of present illness Pt is an 85 y.o. male admitted 05/19/18 with progressive weakness and fall. Worked up for AKI on CKD IV with anasarca; pt also with cryptogenic cirrhosis (recent admission with variceal bleed requiring banding). PMH includes R toe wound (recent debridement), CKD, DM2, HTN, DM.   OT comments  Pt now on air mattress and and has been offered a bed at a SNF for ST rehab. Pt very fatigued today. Assisted NT with pt on bed pan. Pt fatigued and unable to sit EOB for [facility] tansfer this session. OT will continue to follow acutely  Follow Up Recommendations  SNF    Equipment Recommendations  Other (comment)(TBD at nex venue of care)    Recommendations for Other Services      Precautions / Restrictions Precautions Precautions: Fall;Other (comment) Precaution Comments: Bowel incontinence Restrictions Weight Bearing Restrictions: No       Mobility Bed Mobility Overal bed mobility: Needs Assistance Bed Mobility: Rolling Rolling: Max assist         General bed mobility comments: rolling in bed fo toileting and hygiene with OT and NT  Transfers                 General transfer comment: pt unable due to fatigue after toileting    Balance                                           ADL either performed or assessed with clinical judgement   ADL Overall ADL's : Needs assistance/impaired     Grooming: Wash/dry hands;Wash/dry face;Bed level;Set up;Supervision/safety   Upper Body Bathing: Minimal assistance;Sitting;Bed level Upper Body Bathing Details (indicate cue type and reason): simulated               Toilet Transfer Details (indicate cue type and reason): assisted NT with pt on bed pan. Pt fatigued and unable to sit EOB for [facility] tansfer this session Toileting- Clothing Manipulation and Hygiene:  Total assistance;Bed level               Vision Baseline Vision/History: Wears glasses Wears Glasses: At all times Patient Visual Report: No change from baseline     Perception     Praxis      Cognition Arousal/Alertness: Awake/alert Behavior During Therapy: WFL for tasks assessed/performed Overall Cognitive Status: Impaired/Different from baseline Area of Impairment: Attention;Safety/judgement;Problem solving                       Following Commands: Follows one step commands with increased time;Follows one step commands inconsistently     Problem Solving: Slow processing;Decreased initiation;Difficulty sequencing;Requires verbal cues          Exercises     Shoulder Instructions       General Comments      Pertinent Vitals/ Pain       Pain Assessment: No/denies pain Faces Pain Scale: No hurt  Home Living                                          Prior Functioning/Environment              Frequency  Min 2X/week        Progress Toward Goals  OT Goals(current goals can now be found in the care plan section)  Progress towards OT goals: OT to reassess next treatment     Plan      Co-evaluation                 AM-PAC OT "6 Clicks" Daily Activity     Outcome Measure   Help from another person eating meals?: None Help from another person taking care of personal grooming?: A Little Help from another person toileting, which includes using toliet, bedpan, or urinal?: Total Help from another person bathing (including washing, rinsing, drying)?: Total Help from another person to put on and taking off regular upper body clothing?: A Little Help from another person to put on and taking off regular lower body clothing?: Total 6 Click Score: 13    End of Session    OT Visit Diagnosis: Unsteadiness on feet (R26.81);Repeated falls (R29.6);History of falling (Z91.81);Other symptoms and signs involving cognitive  function   Activity Tolerance Patient limited by fatigue   Patient Left in bed;with call bell/phone within reach;with family/visitor present   Nurse Communication          Time: 9509-3267 OT Time Calculation (min): 19 min  Charges: OT General Charges $OT Visit: 1 Visit OT Treatments $Self Care/Home Management : 8-22 mins     Britt Bottom 05/27/2018, 1:55 PM

## 2018-05-27 NOTE — Progress Notes (Signed)
I received a [place] to provide spiritual support for the patient. I visited the patient's room with his wife, Shauna Hugh, present. They expressed their need for prayer. I shared words of encouragement, read the Scriptures and led in prayer. I shared that the Chaplain is available for additional support as needed or requested.    05/27/18 0900  Clinical Encounter Type  Visited With Patient and family together  Visit Type Spiritual support  Referral From Nurse  Consult/Referral To Chaplain  Spiritual Encounters  Spiritual Needs Prayer;Emotional  Stress Factors  Patient Stress Factors None identified  Family Stress Factors Exhausted    Chaplain Dr Redgie Grayer

## 2018-05-27 NOTE — Progress Notes (Signed)
Air bed mattress ordered from portable.

## 2018-05-28 LAB — GLUCOSE, CAPILLARY
Glucose-Capillary: 91 mg/dL (ref 70–99)
Glucose-Capillary: 98 mg/dL (ref 70–99)

## 2018-05-28 NOTE — Clinical Social Work Note (Signed)
Patient has insurance approval to discharge to [facility] SNF today.  Dayton Scrape, [place]

## 2018-05-28 NOTE — Progress Notes (Signed)
Patient is 85 year old male with history of cryptogenic cirrhosis, CKD stage IV, type 2 diabetes, pancytopenia, hypertension, diabetes, esophageal varices, GI bleed who was admitted for anasarca.  He was diuresed with Lasix and also treated with albumin.  Nephrology was following. Discharge orders have been put yesterday with discharge summary. Patient seen and examined at bedside this morning.  Remains hemodynamically stable.  There is no acute issues currently.  Patient denies any complaints. His family were present at the bedside. Patient is stable for discharge to skilled facility today.

## 2018-05-28 NOTE — Clinical Social Work Note (Signed)
CSW facilitated patient discharge including contacting patient family and facility to confirm patient discharge plans. Clinical information faxed to facility and family agreeable with plan. CSW arranged ambulance transport via [facility] to [facility] at 2:00. RN to call report prior to discharge [phone]).  CSW will sign off for now as social work intervention is no longer needed. Please consult [place] again if new needs arise.  Dayton Scrape, [place]

## 2018-05-28 NOTE — Clinical Social Work Placement (Signed)
   CLINICAL SOCIAL WORK PLACEMENT  NOTE  Date:  05/28/2018  Patient Details  Name: Gary Ingram. MRN: [id number] Date of Birth: 1932/06/04  Clinical Social Work is seeking post-discharge placement for this patient at the [place] level of care (*CSW will initial, date and re-position this form in  chart as items are completed):      Patient/family provided with [place] Work Department's list of facilities offering this level of care within the geographic area requested by the patient (or if unable, by the patient's family).      Patient/family informed of their freedom to choose among providers that offer the needed level of care, that participate in Medicare, Medicaid or managed care program needed by the patient, have an available bed and are willing to accept the patient.      Patient/family informed of [facility]'s ownership interest in [facility] and [facility], as well as of the fact that they are under no obligation to receive care at these facilities.  PASRR submitted to EDS on 05/25/18     PASRR number received on 05/25/18     Existing PASRR number confirmed on       FL2 transmitted to all facilities in geographic area requested by pt/family on 05/25/18     FL2 transmitted to all facilities within larger geographic area on       Patient informed that his/her managed care company has contracts with or will negotiate with certain facilities, including the following:        Yes   Patient/family informed of bed offers received.  Patient chooses bed at [facility]     Physician recommends and patient chooses bed at      Patient to be transferred to [facility] on 05/28/18.  Patient to be transferred to facility by [facility]     Patient family notified on 05/28/18 of transfer.  Name of family member notified:  Phineas Semen and Marita [place]     PHYSICIAN Please prepare prescriptions     Additional Comment:     _______________________________________________ Candie Chroman, LCSW 05/28/2018, 9:35 AM

## 2018-05-28 NOTE — Progress Notes (Signed)
  Speech Language Pathology Treatment: Dysphagia  Patient Details Name: Gary Ingram. MRN: [id number] DOB: 05/19/1932 Today's Date: 05/28/2018 Time: 1324-4010 SLP Time Calculation (min) (ACUTE ONLY): 11 min  Assessment / Plan / Recommendation Clinical Impression  Patient seen for skilled ST treatment for diet tolerance and training in aspiration precautions/compensatory techniques. Pt's son reported MD stated plain water OK for pt to drink. SLP educated re: free water protocol, providing written and verbal education re: how to perform oral care prior to drinking water, and to refrain from intake of food or other liquids (water between meals only). SLP also educated re: pt's aspiration (and delayed sensation) with thin liquids. Pt declined solids, however SLP assessed pt's tolerance of nectar thick liquids. Immediate or delayed coughing noted with 60% of cup sips, initially. SLP instructed pt to take smaller sips, "like sipping hot coffee or soup," and subsequently only 1 delayed cough noted in 10 trials. Usual verbal cues required to use this strategy. Education completed with pt/family re: rationale for use of strategies/diet modifications and aspiration precautions. Session terminated at pt request as NT arrived to assist pt with toileting. Continue dys 1/nectar thick liquids, meds crushed in puree. Allow free water between meals after oral care. Pt would benefit from follow-up with ST in SNF for ongoing assessment of safety with diet and for training in aspiration precautions.     HPI HPI: Pt is an 85 y.o. male admitted 05/19/18 with progressive weakness and fall. Worked up for AKI on CKD IV with anasarca; pt also with cryptogenic cirrhosis (recent admission with variceal bleed requiring banding). PMH includes R toe wound (recent debridement), CKD, DM2, HTN, DM.      SLP Plan  Continue with current plan of care       Recommendations  Diet recommendations: Dysphagia 1 (puree);Nectar-thick  liquid;Other(comment)(thin water between meals after oral care) Liquids provided via: Cup;No straw;Teaspoon Medication Administration: Crushed with puree Supervision: Staff to assist with self feeding;Full supervision/cueing for compensatory strategies Compensations: Minimize environmental distractions;Slow rate;Small sips/bites Postural Changes and/or Swallow Maneuvers: Seated upright 90 degrees;Upright 30-60 min after meal                Oral Care Recommendations: Oral care BID;Oral care prior to ice chip/H20 Follow up Recommendations: Skilled Nursing facility;24 hour supervision/assistance SLP Visit Diagnosis: Dysphagia, oropharyngeal phase (R13.12) Plan: Continue with current plan of care       [place], [place], [place] Pathologist Acute Rehabilitation Services Pager: [phone] Office: [phone]  Aliene Altes 05/28/2018, 10:17 AM

## 2018-05-28 NOTE — Progress Notes (Signed)
PT Cancellation Note  Patient Details Name: Gary Ingram. MRN: [id number] DOB: 1933-01-10   Cancelled Treatment:    Reason Eval/Treat Not Completed: Other (comment). Noted pt to discharge to SNF today. Will defer treatment.  Mabeline Caras, PT, DPT Acute Rehabilitation Services  Pager [phone] Office [place] 05/28/2018, 12:28 PM

## 2018-06-03 ENCOUNTER — Telehealth: Payer: Self-pay

## 2018-06-03 NOTE — Telephone Encounter (Signed)
Called and spoke with patient's daughter-Ann - verified DPR/emergency contact=patient is currently in a rehab facility where speech therapy has evaluated and is treating the patient with thickened liquids; Ann reports she will call the office when the patient is able to leave the rehab facility for an further treatments= she is aware Dr. Bryan Lemma has recommended the patient have a repeat upper gi endo as soon as the patient is able to schedule the appt; Ann verbalized understanding of information/instructions; Lelon Frohlich was advised to call back if questions/concerns arise;

## 2018-06-17 ENCOUNTER — Non-Acute Institutional Stay: Payer: Medicare Other | Admitting: Internal Medicine

## 2018-06-17 ENCOUNTER — Encounter: Payer: Self-pay | Admitting: Internal Medicine

## 2018-06-17 DIAGNOSIS — Z515 Encounter for palliative care: Secondary | ICD-10-CM

## 2018-06-17 NOTE — Progress Notes (Signed)
Erroneous encounter.  Duplicate.  Gonzella Lex, NP

## 2018-06-19 ENCOUNTER — Non-Acute Institutional Stay: Payer: Medicare Other | Admitting: Internal Medicine

## 2018-06-19 DIAGNOSIS — Z515 Encounter for palliative care: Secondary | ICD-10-CM

## 2018-06-22 ENCOUNTER — Other Ambulatory Visit: Payer: Medicare Other | Admitting: Internal Medicine

## 2018-06-22 DIAGNOSIS — Z515 Encounter for palliative care: Secondary | ICD-10-CM

## 2018-06-25 NOTE — Progress Notes (Signed)
[place] Consult Note Telephone: [phone]  Fax: [phone]  PATIENT NAME: Gary Ingram. DOB: Jan 28, 1933 MRN: [id number]  PRIMARY CARE PROVIDER:   Marton Redwood, MD  REFERRING PROVIDER:  Humberto Leep, NP/ Dr. Daylene Posey  RESPONSIBLE PARTY:   Self EMERGENCY CONTACT:  [place]) [phone] cell, [phone]                       Tasean Mancha (son) [phone]     RECOMMENDATIONS and PLAN:  Palliative Care Encounter Z51.5  1. Fatigue:  Related to multiple comorbidities along with anemia (GI bleed and of chronic disease).  No expectation for additional improvement. Renal function declining while at SNF.  Guarded use of diuretics due to CKD.  Supportive care.  No plans for hemodialysis.  Consider transition to comfort care upon discharge to home. Recommendations agreed upon by community PCP(Dr. Brigitte Pulse).  2. Protein calorie malnutrition:  Alb/Prot levels are 3.1/5.6.  Add supplemental nutritional beverages per nutritionist recommendation(Renal formula).  Aspiration risks reviewed.  3.  Advanced care planning:  Goals of care discussed with patient and wife woho was at bedside.  Pt would like to continue therapies with attempts to increase strength and endurance.  He also plans on returning home but now to live with daughter and her family.  Advanced directives discussed with desires for CPR without intubation and return to hospital for necessary treatments if recommended.  Phone discussion with daughter who states that she understands severity of pt's health and poor long term prognosis.  Appointment made with daughter and son for 2/26 at bedside.  Palliative care will readdress goals of care and ACP at family meeting.  I spent  minutes providing this consultation at bedside,  from 1300 to 1345.. More than 50% of the time in this consultation was spent coordinating communication with patient, wife and daugter(via phone).    HISTORY OF PRESENT ILLNESS:  Gary Ingram. is a 85 y.o. year old male with multiple medical problems including end stage CHF, CKD stage V(GFR <14, CREA 3.84)  and Cryptogenic cirrhosis with esophagea varacies.  He was hospitalized from 1/21-1/30/20 due to anasarca, worsening of CKD and acute CHF. He was also admitted 3 weeks prior due to an upper GI bleed related to bleeding from varacies. No reports of additional bleeding since banding procedure.  Staff reports some ambulation with PT and walker, fair nutritional intake. He is continent and is able to feed self but requires assist with ADLs.  Palliative Care was asked to help address goals of care.   CODE STATUS: Full Code  PPS: 40% HOSPICE ELIGIBILITY/DIAGNOSIS: YES/ CKD stage V, CHF, protein calorie malnutrition  PAST MEDICAL HISTORY:  Past Medical History:  Diagnosis Date  . Adenomatous colon polyp   . Anemia   . AVM (arteriovenous malformation) of colon   . Chronic kidney disease, stage III (moderate) (HCC)   . Diabetes ([place])   . Dyslipidemia   . Esophageal varices ([place])   . GI bleed   . Helicobacter pylori gastritis   . Hypertension   . Iron deficiency anemia   . Obesity   . Peripheral neuropathy   . Portal hypertension ([place])   . Splenomegaly   . Thrombocytopenia ([place])   . Thrombocytopenia ([place])   . TIA (transient ischemic attack)    ?  Marland Kitchen Vertigo   . Vertigo     PHYSICAL EXAM:   General: Chronically ill and  frail appearing.  In NAD Cardiovascular: systolic murmur, regular rate and rhythm Pulmonary: clear ant fields Abdomen: soft, nontender, + bowel sounds GU: no suprapubic tenderness Extremities: 2+ pitting edema BLE to knee level Skin: Multiple patches of ecchymosis of BUE Neurological: A&O x2.  Generalized weakness Psych:  Calm and cooperative.  Appropriate mood  Gonzella Lex, NP-C

## 2018-06-28 NOTE — Progress Notes (Signed)
[facility] Consult Note Telephone: [phone]  Fax: [phone]  PATIENT NAME: Gary Ingram. DOB: 02-Dec-1932 MRN: [id number]  PRIMARY CARE PROVIDER:   Marton Redwood, MD  REFERRING PROVIDER:  Humberto Leep, NP/ Dr. Daylene Ingram  RESPONSIBLE PARTY:   Self EMERGENCY CONTACT:  Gary Ingram) [phone] cell, [phone]                       Gary Ingram (son) [phone]    RECOMMENDATIONS and PLAN:  Palliative Care Encounter Z51.5  1. Fatigue: Chronic and related to multiple comorbidities. No plans for hemodialysis.  Diuretics on hold currently.  Supportive care by family.  2. Protein calorie malnutrition:  Continue supplemental nutritional beverages along with routine heart healthy meals.  Aspiration risks reviewed.  3. Medication care plan:  Reviewed discharge medication list and home meds.  Instructed Gary Ingram and pt to hold use of Spronolactone and Furosemide. Gary Ingram was contacted to have new RX of Levothyroxine 52mcg and midodrine 5mg  sent to pharmacy for pt.  4.  Advanced care planning:  Goals of care readdressed in joint meeting with pt., wife, son and Gary Ingram. Advanced directives discussed with desires for CPR without intubation. No desire to return to hospital other than for an injury.  Comfort measures with transition to Hospice care at home. Dr. Brigitte Ingram was updated on pt status and plans on F/U in the office.    I spent  minutes providing this consultation at home,  from 1100 to 1230.Marland Kitchen More than 50% of the time in this consultation was spent coordinating communication with patient, wife and Gary Ingram and son  HISTORY OF PRESENT ILLNESS: Follow up with Gary Ingram an 85 y.o. year old male with multiple medical problems including end stage CHF, CKD stage V(GFR <14, CREA 3.84)  and Cryptogenic cirrhosis with esophageal varacies.  He was hospitalized from 1/21-1/30/20 due to anasarca, worsening of CKD  and acute CHF. He was also admitted 3 weeks prior due to an upper GI bleed related to bleeding from varacies. No reports of additional bleeding since banding procedure. He and his wife have moved in with his Gary Ingram for assistance with ADLs(wife has post polio syndrome with limitations). Gary Ingram reports daytime napping but is also able to sleep at HS. Ambulatory with use of walker.  No report of falls. Continues to tire easily with shortness of breath on exertion.  Palliative Care was asked to help address goals of care.   CODE STATUS: CPR attempt without intubation  PPS: 40% HOSPICE ELIGIBILITY/DIAGNOSIS: YES/ CKD stage V, CHF, protein calorie malnutrition  PAST MEDICAL HISTORY:  Past Medical History:  Diagnosis Date  . Adenomatous colon polyp   . Anemia   . AVM (arteriovenous malformation) of colon   . Chronic kidney disease, stage III (moderate) (HCC)   . Diabetes ([place])   . Dyslipidemia   . Esophageal varices ([place])   . GI bleed   . Helicobacter pylori gastritis   . Hypertension   . Iron deficiency anemia   . Obesity   . Peripheral neuropathy   . Portal hypertension ([place])   . Splenomegaly   . Thrombocytopenia ([place])   . Thrombocytopenia ([place])   . TIA (transient ischemic attack)    ?  Marland Kitchen Vertigo   . Vertigo     PHYSICAL EXAM:   General: Chronically ill and  frail appearing.  In NAD Cardiovascular: systolic murmur, regular rate and rhythm Pulmonary: clear ant fields  Abdomen: soft, nontender, + bowel sounds GU: no suprapubic tenderness Extremities: 2+ pitting edema BLE to knee level Skin: Pale in color.  Purpura of BUE Neurological: A&O x3.  Generalized weakness  Gonzella Lex, NP-C

## 2018-06-28 NOTE — Progress Notes (Signed)
    [facility] Consult Note Telephone: [phone]  Fax: [phone]  PATIENT NAME: Gary Ingram. DOB: 26-Jan-1933 MRN: [id number]  PRIMARY CARE PROVIDER:   Marton Redwood, MD  NOTE:  Patient was discharged to daughter's home prior to arrival at [facility].  Per phone conversation with daughter, home visit will occur on 06/22/18 at 1100.  Follow discharge instructions.  Advanced care planning will be readdressed.  Gonzella Lex, NP-C

## 2018-07-29 DEATH — deceased

## 2018-08-05 ENCOUNTER — Encounter: Payer: Self-pay | Admitting: Family

## 2020-12-23 IMAGING — CR DG CHEST 2V
2 series · 2 of 2 positions shown · non-contrast
Comparison: 08/27/2006

CLINICAL DATA: Chronic productive cough

EXAM:
CHEST - 2 VIEW

[chest lat]
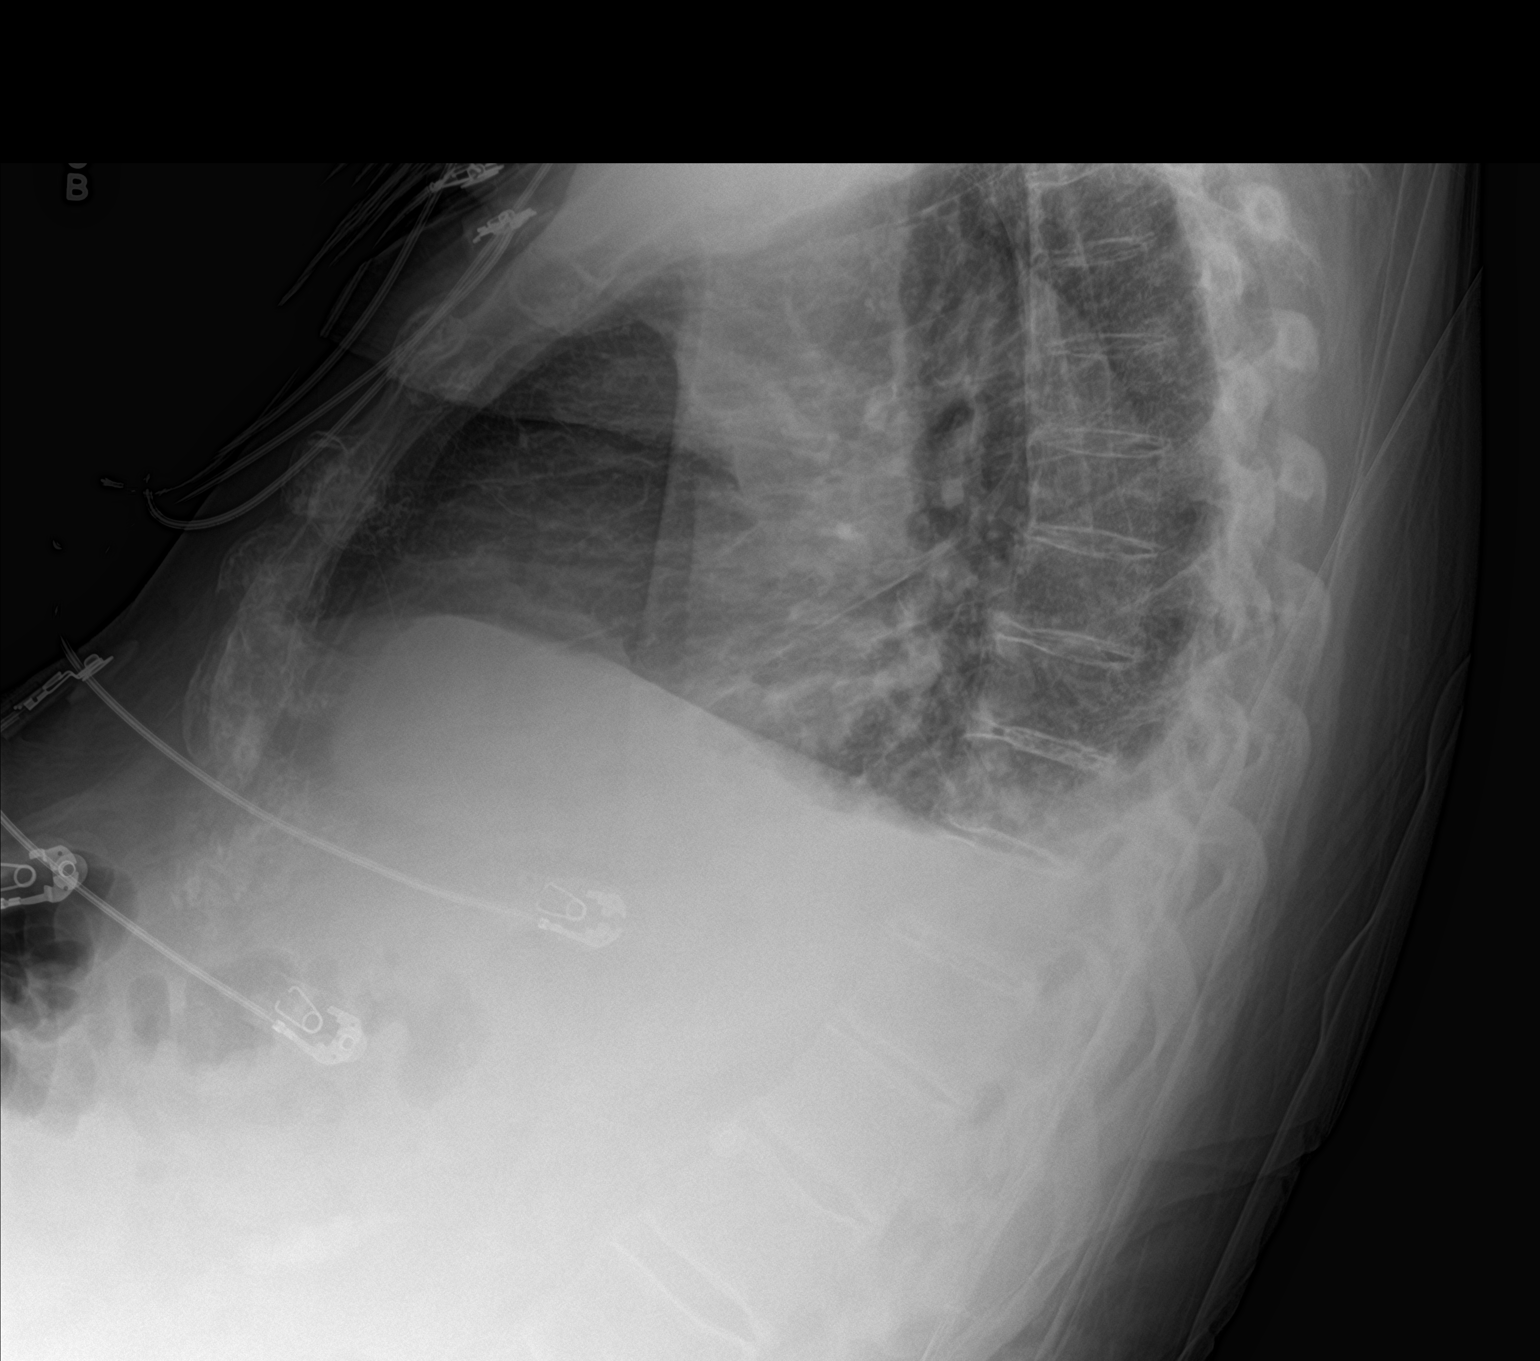

[chest ap]
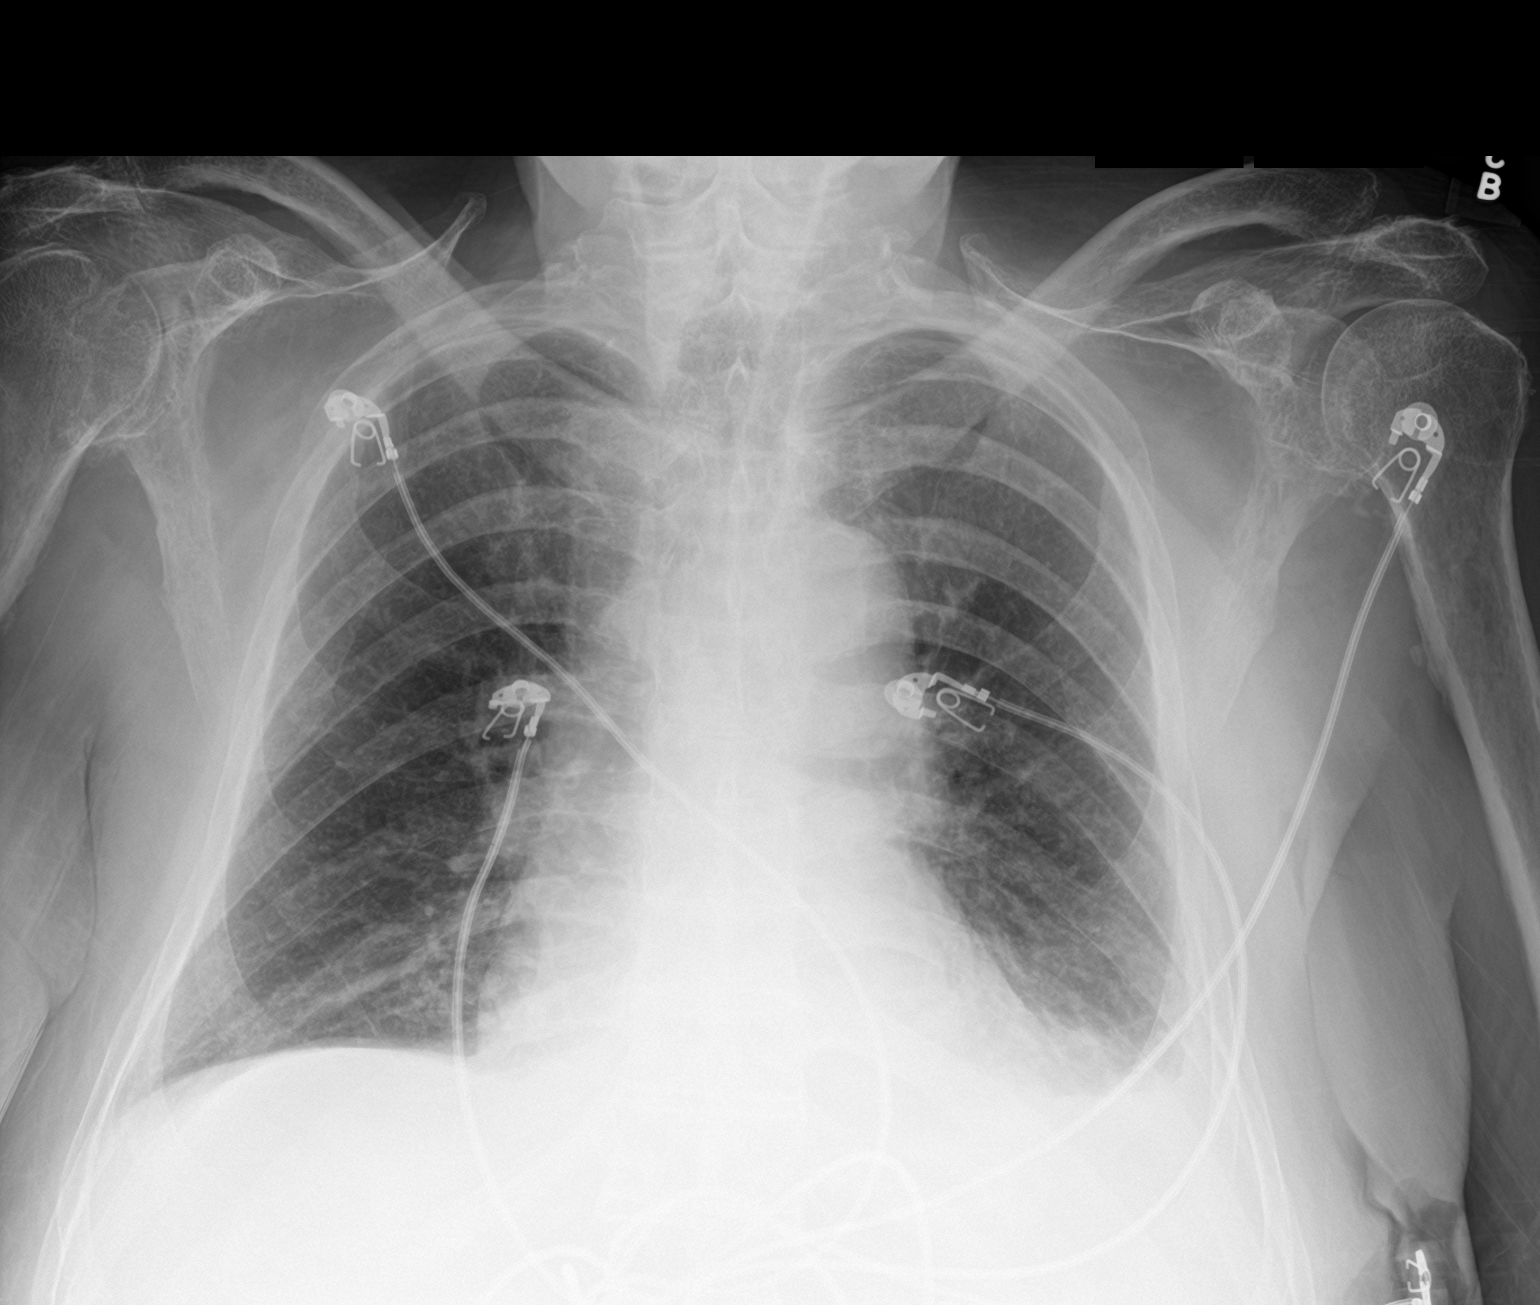

[2 of 2 positions shown; findings below may reference images not displayed]

FINDINGS: Small left effusion, new since 0668. No overt pulmonary edema. Left
basilar atelectasis is identified. Stable mild cardiomegaly with
uncoiled thoracic aorta. Osteoarthritis of the AC and glenohumeral
joints.
IMPRESSION: Small left effusion, new since 0668. Left basilar atelectasis.

## 2020-12-24 IMAGING — US US RENAL
1 series · 14 of 25 positions shown · non-contrast
Comparison: None.

CLINICAL DATA: Acute renal injury

EXAM:
RENAL / URINARY TRACT ULTRASOUND COMPLETE

[Series 1: us renal · 14 of 27 slices shown]
[im 1/27]
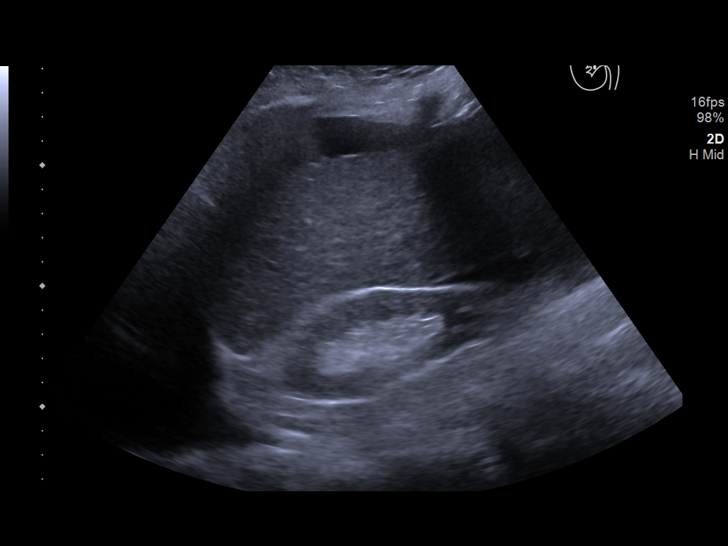
[im 3/27]
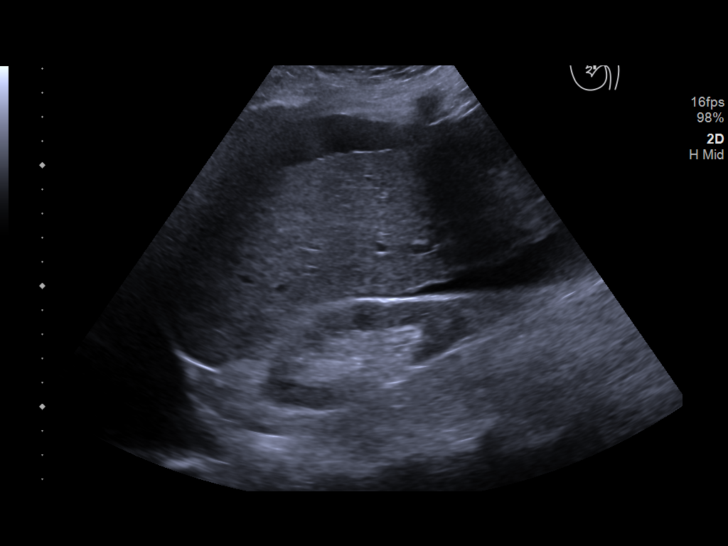
[im 5/27]
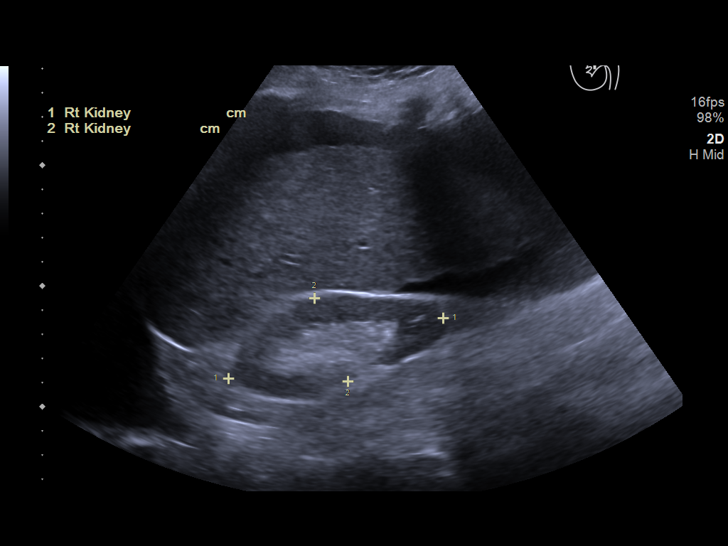
[im 7/27]
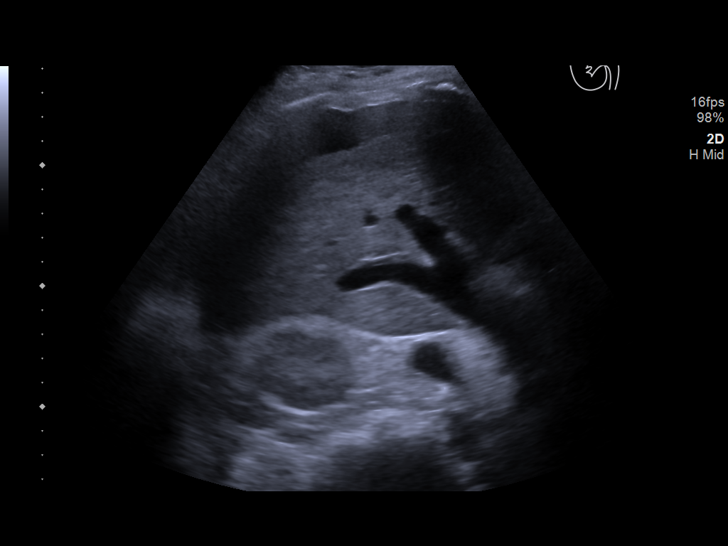
[im 9/27]
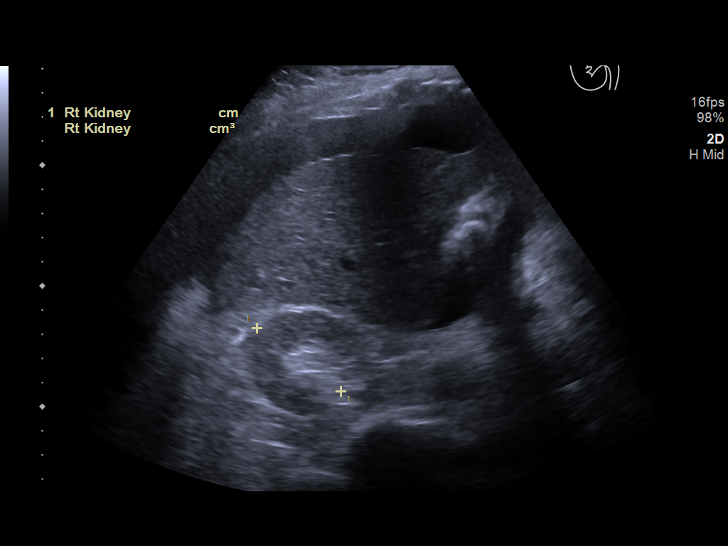
[im 10/27]
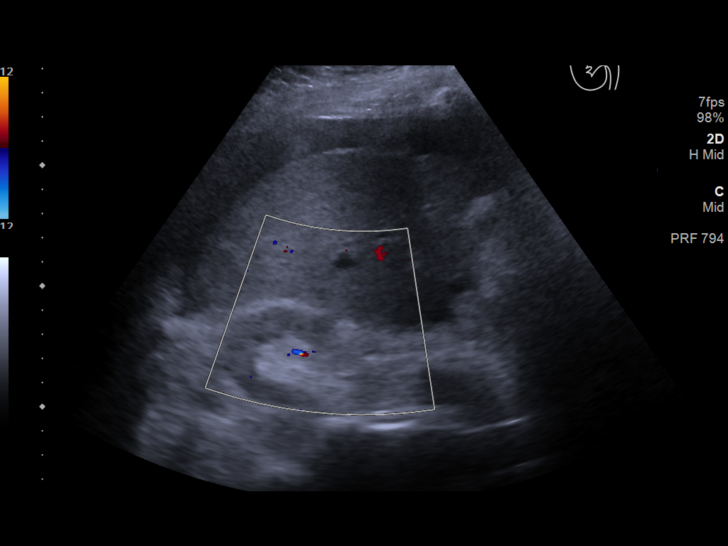
[im 12/27]
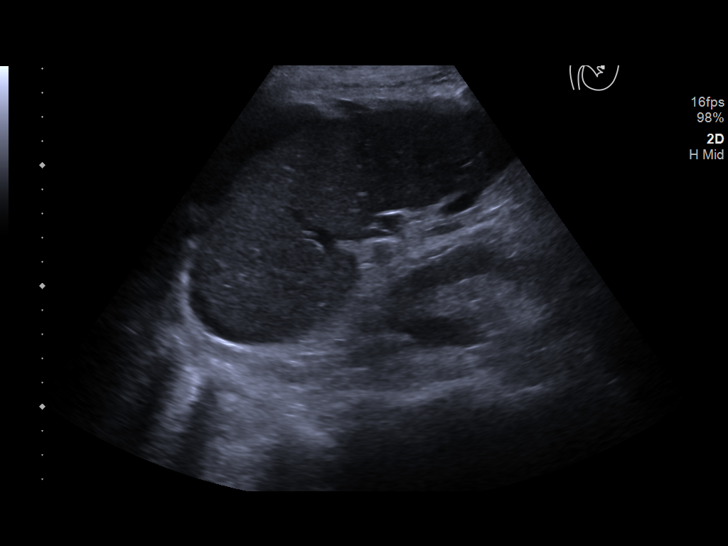
[im 15/27]
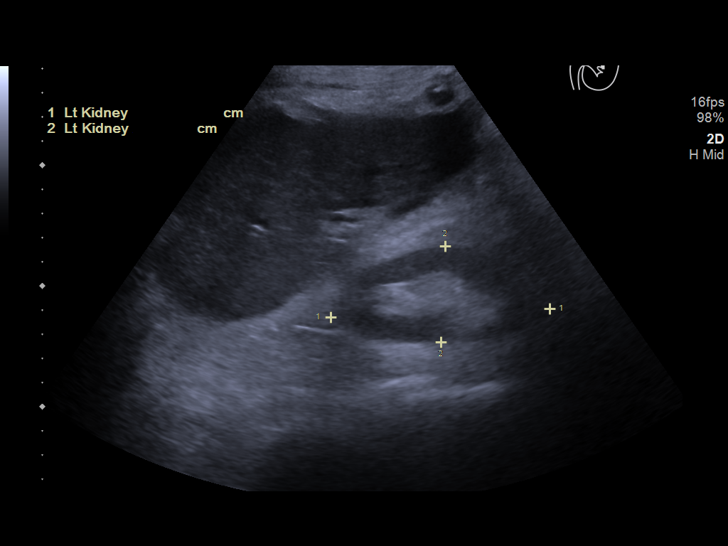
[im 17/27]
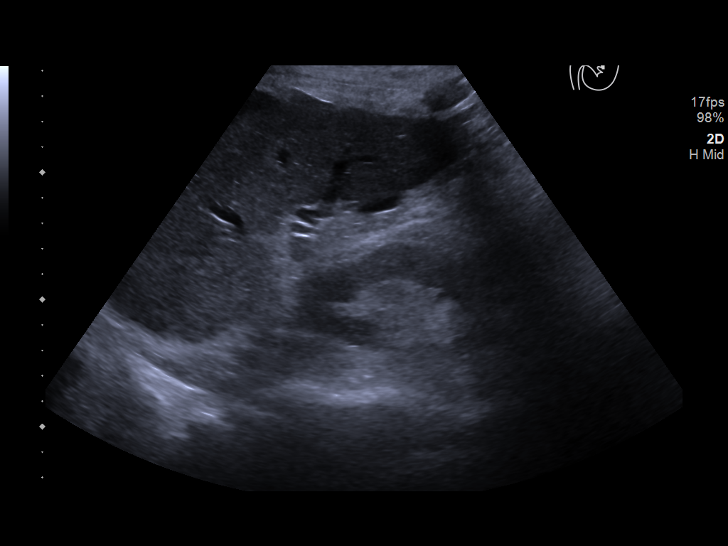
[im 18/27]
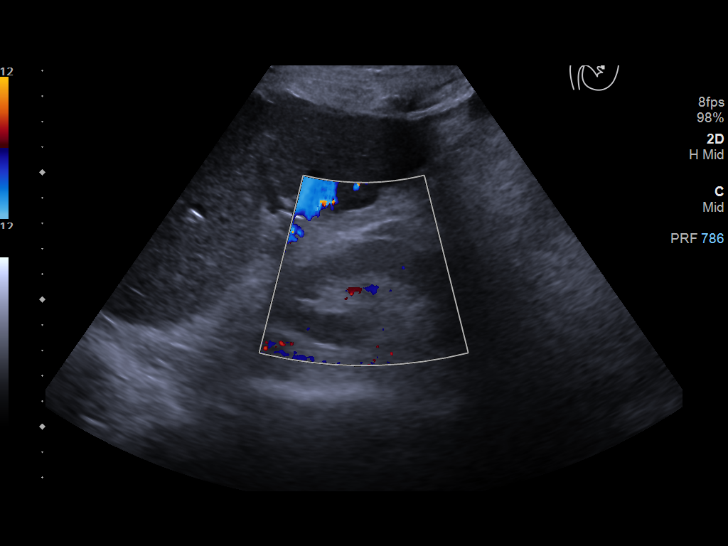
[im 20/27]
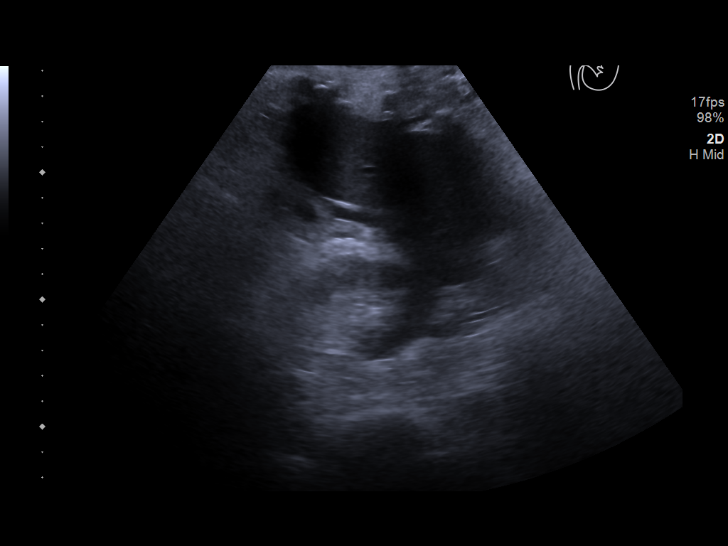
[im 22/27]
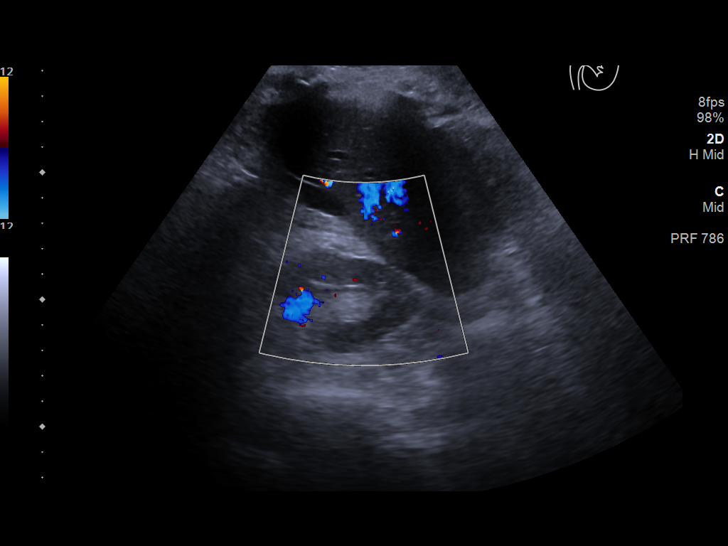
[im 24/27]
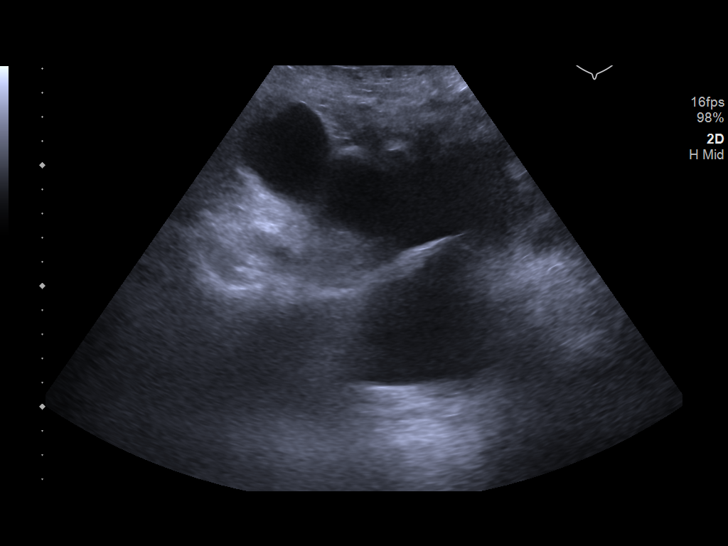
[im 27/27]
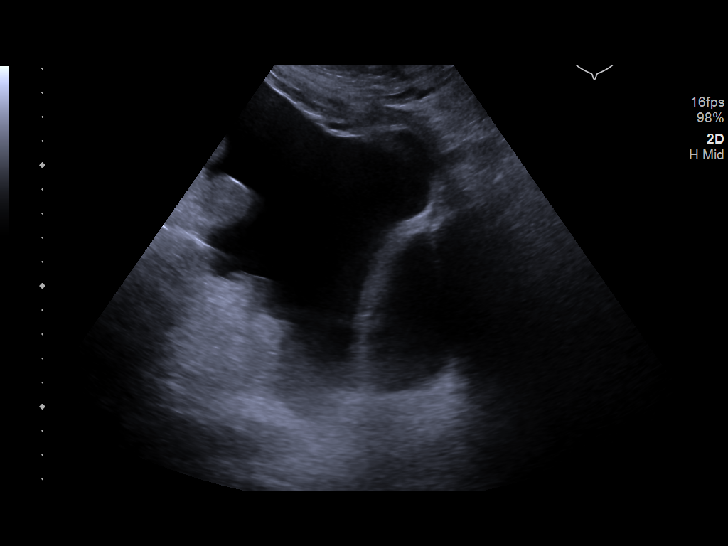

[14 of 25 positions shown; findings below may reference images not displayed]

FINDINGS: Right Kidney:

Renal measurements: 9.3 x 3.7 x 4.4 cm = volume: 78.8 mL .
Echogenicity within normal limits. No mass or hydronephrosis
visualized.

Left Kidney:

Renal measurements: 9.1 x 4.0 x 4.4 cm = volume: 84.0 mL.
Echogenicity within normal limits. No mass or hydronephrosis
visualized.

Bladder:

Appears normal for degree of bladder distention.
IMPRESSION: 1. Nodular liver consistent with cirrhosis. Bilateral pleural
effusions and significant ascites.
2. No renal abnormalities.
